# Patient Record
Sex: Female | Born: 1935 | Race: White | Hispanic: No | Smoking: Never smoker
Health system: Southern US, Community
[De-identification: ages and names within clinical notes are randomized; demographics above are authoritative.]

## PROBLEM LIST (undated history)

## (undated) DIAGNOSIS — I10 Essential (primary) hypertension: Secondary | ICD-10-CM

## (undated) DIAGNOSIS — I639 Cerebral infarction, unspecified: Secondary | ICD-10-CM

## (undated) HISTORY — PX: ABDOMINAL HYSTERECTOMY: SHX81

---

## 2004-05-10 ENCOUNTER — Ambulatory Visit: Payer: Self-pay | Admitting: Family Medicine

## 2004-08-29 ENCOUNTER — Ambulatory Visit: Payer: Self-pay | Admitting: Gastroenterology

## 2005-06-18 ENCOUNTER — Ambulatory Visit: Payer: Self-pay | Admitting: Family Medicine

## 2006-06-23 ENCOUNTER — Ambulatory Visit: Payer: Self-pay | Admitting: Family Medicine

## 2007-07-20 ENCOUNTER — Ambulatory Visit: Payer: Self-pay | Admitting: Family Medicine

## 2008-02-24 ENCOUNTER — Ambulatory Visit: Payer: Self-pay | Admitting: Gastroenterology

## 2008-07-25 ENCOUNTER — Ambulatory Visit: Payer: Self-pay | Admitting: Family Medicine

## 2009-08-09 ENCOUNTER — Ambulatory Visit: Payer: Self-pay | Admitting: Family Medicine

## 2010-03-23 ENCOUNTER — Ambulatory Visit: Payer: Self-pay

## 2011-01-23 ENCOUNTER — Ambulatory Visit: Payer: Self-pay | Admitting: Family Medicine

## 2011-05-22 ENCOUNTER — Ambulatory Visit: Payer: Self-pay | Admitting: Family Medicine

## 2011-07-21 ENCOUNTER — Ambulatory Visit: Payer: Self-pay | Admitting: Gastroenterology

## 2013-07-10 ENCOUNTER — Observation Stay: Payer: Self-pay | Admitting: Internal Medicine

## 2013-07-10 LAB — TSH: Thyroid Stimulating Horm: 2.82 u[IU]/mL

## 2013-07-10 LAB — BASIC METABOLIC PANEL
Anion Gap: 10 (ref 7–16)
BUN: 13 mg/dL (ref 7–18)
CALCIUM: 10 mg/dL (ref 8.5–10.1)
CREATININE: 1.05 mg/dL (ref 0.60–1.30)
Chloride: 96 mmol/L — ABNORMAL LOW (ref 98–107)
Co2: 27 mmol/L (ref 21–32)
EGFR (Non-African Amer.): 48 — ABNORMAL LOW
GFR CALC AF AMER: 56 — AB
GLUCOSE: 175 mg/dL — AB (ref 65–99)
Osmolality: 271 (ref 275–301)
POTASSIUM: 3.4 mmol/L — AB (ref 3.5–5.1)
SODIUM: 133 mmol/L — AB (ref 136–145)

## 2013-07-10 LAB — CBC
HCT: 47.2 % — AB (ref 35.0–47.0)
HGB: 16 g/dL (ref 12.0–16.0)
MCH: 31.4 pg (ref 26.0–34.0)
MCHC: 33.8 g/dL (ref 32.0–36.0)
MCV: 93 fL (ref 80–100)
PLATELETS: 220 10*3/uL (ref 150–440)
RBC: 5.09 10*6/uL (ref 3.80–5.20)
RDW: 13.9 % (ref 11.5–14.5)
WBC: 7.3 10*3/uL (ref 3.6–11.0)

## 2013-07-10 LAB — TROPONIN I
TROPONIN-I: 0.11 ng/mL — AB
Troponin-I: 0.11 ng/mL — ABNORMAL HIGH
Troponin-I: 0.11 ng/mL — ABNORMAL HIGH

## 2013-07-10 LAB — MAGNESIUM: MAGNESIUM: 1.5 mg/dL — AB

## 2013-07-11 LAB — CBC WITH DIFFERENTIAL/PLATELET
BASOS PCT: 0.9 %
Basophil #: 0.1 10*3/uL (ref 0.0–0.1)
Eosinophil #: 0.2 10*3/uL (ref 0.0–0.7)
Eosinophil %: 2.3 %
HCT: 43.9 % (ref 35.0–47.0)
HGB: 15 g/dL (ref 12.0–16.0)
Lymphocyte #: 2.4 10*3/uL (ref 1.0–3.6)
Lymphocyte %: 27.8 %
MCH: 31.6 pg (ref 26.0–34.0)
MCHC: 34.1 g/dL (ref 32.0–36.0)
MCV: 93 fL (ref 80–100)
MONOS PCT: 8.9 %
Monocyte #: 0.8 x10 3/mm (ref 0.2–0.9)
NEUTROS PCT: 60.1 %
Neutrophil #: 5.1 10*3/uL (ref 1.4–6.5)
Platelet: 218 10*3/uL (ref 150–440)
RBC: 4.74 10*6/uL (ref 3.80–5.20)
RDW: 13.9 % (ref 11.5–14.5)
WBC: 8.5 10*3/uL (ref 3.6–11.0)

## 2013-07-11 LAB — LIPID PANEL
CHOLESTEROL: 159 mg/dL (ref 0–200)
HDL: 45 mg/dL (ref 40–60)
Ldl Cholesterol, Calc: 87 mg/dL (ref 0–100)
TRIGLYCERIDES: 135 mg/dL (ref 0–200)
VLDL CHOLESTEROL, CALC: 27 mg/dL (ref 5–40)

## 2013-07-11 LAB — BASIC METABOLIC PANEL
Anion Gap: 7 (ref 7–16)
BUN: 11 mg/dL (ref 7–18)
CREATININE: 0.96 mg/dL (ref 0.60–1.30)
Calcium, Total: 9.6 mg/dL (ref 8.5–10.1)
Chloride: 104 mmol/L (ref 98–107)
Co2: 27 mmol/L (ref 21–32)
GFR CALC NON AF AMER: 54 — AB
Glucose: 91 mg/dL (ref 65–99)
OSMOLALITY: 275 (ref 275–301)
POTASSIUM: 4 mmol/L (ref 3.5–5.1)
Sodium: 138 mmol/L (ref 136–145)

## 2013-07-11 LAB — HEMOGLOBIN A1C: Hemoglobin A1C: 6.2 % (ref 4.2–6.3)

## 2014-01-25 ENCOUNTER — Ambulatory Visit: Payer: Self-pay | Admitting: Ophthalmology

## 2014-02-22 ENCOUNTER — Ambulatory Visit: Payer: Self-pay | Admitting: Ophthalmology

## 2014-06-10 NOTE — Discharge Summary (Signed)
PATIENT NAME:  Christina Bradshaw, Christina Bradshaw MR#:  [id number] DATE OF BIRTH:  [birth year]  DATE OF ADMISSION:  07/10/2013 DATE OF DISCHARGE:  07/11/2013  DISCHARGE DIAGNOSES: 1.  Elevated troponin, likely due to supply/demand ischemia with elevated heart rate and uncontrolled blood pressure.  2.  Possible arrhythmia, likely supraventricular tachycardia, now normal sinus rhythm.  3.  Hypokalemia, repleted and resolved.  4.  Uncontrolled hypertension, much better now. 5.  Impaired fasting glucose with hemoglobin A1c of 6.2. 6.  Anxiety, on as needed Xanax.   SECONDARY DIAGNOSES: 1.  Hypothyroidism.  2.  Hypertension.  3.  Constipation.   CONSULTATIONS: Cardiology, Dr. Harold HedgeKenneth Fath.   PROCEDURES AND RADIOLOGY: Chest x-ray on the 24th of May showed COPD. No acute cardiopulmonary disease. Aortic atherosclerosis.   A 2-D echocardiogram on the 24th of May showed LVEF of 55% to 60%.   HISTORY AND SHORT HOSPITAL COURSE: The patient is an 81 year old female with the above-mentioned medical problems who was admitted for fast beating heart/tachycardia. She was also found to have elevated troponin, which was thought to be due to supply/demand ischemia. Arrhythmia was initially thought to be SVT and was improved with rate controlling medication. Please see Dr. Jetta Loutichard Wieting's dictated history and physical for further details. Cardiology consultation was obtained with Dr. Harold HedgeKenneth Fath who recommended 2-D echocardiogram, which was obtained with results dictated above. The patient was feeling much better. By the 25th of May her rate was much better controlled. Potassium was repleted. Blood pressure got significantly better controlled and was discharged home in stable condition.   PERTINENT DISCHARGE PHYSICAL EXAMINATION: VITAL SIGNS: On the date of discharge, her temperature was 97.7, heart rate 77 per minute, respirations 16 per minute, blood pressure 154/78 mmHg and she was saturating 97% on room air.   CARDIOVASCULAR: S1, S2 normal. No murmurs, rubs or gallops.  LUNGS: Clear to auscultation bilaterally. No wheezing, rales, rhonchi, or crepitation.  ABDOMEN: Soft, benign.  NEUROLOGIC: Nonfocal examination. All other physical examination remained at baseline.   DISCHARGE MEDICATIONS:  1.  Amlodipine 5 mg p.o. daily.  2.  Lisinopril 40 mg p.o. daily. 3.  Levothyroxine 75 mcg p.o. daily.  4.  Vitamin D3 400 international units once at bedtime.  5.  Colace 100 mg 2 tablets p.o. at bedtime.  6.  Tylenol 1000 mg p.o. every 6 hours needed.  7.  Tylenol PM 1 tablet at bedtime as needed.  8.  Aspirin 81 mg p.o. daily.  9.  Isosorbide mononitrate 30 mg p.o. daily.  10.  Metoprolol 25 mg p.o. b.i.d. 11.  Hydralazine 10 mg p.o. 4 times a day.  12.  Alprazolam 0.25 mg p.o. at bedtime as needed.   DISCHARGE DIET: Low sodium, low fat, low cholesterol.   DISCHARGE ACTIVITY: As tolerated.   DISCHARGE INSTRUCTIONS AND FOLLOW-UP: The patient was instructed to follow up with her primary care physician, Dr. Dorothey Basemanavid Bronstein, in 1 to 2 weeks. She will [facility] in 2 to 4 weeks.   TOTAL TIME DISCHARGING THIS PATIENT: 45 minutes.  ____________________________ Ellamae SiaVipul S. Sherryll BurgerShah, MD vss:sb D: 07/15/2013 11:25:24 ET T: 07/15/2013 11:44:38 ET JOB#: [id number]  cc: Theodis Kinsel S. Sherryll BurgerShah, MD, <Dictator> Teena Iraniavid M. Terance HartBronstein, MD Darlin PriestlyKenneth A. Lady GaryFath, MD Patricia PesaVIPUL S Braheem Tomasik MD ELECTRONICALLY SIGNED 07/15/2013 12:11

## 2014-06-10 NOTE — Consult Note (Signed)
   Present Illness 81 yo female with history of hypertension who was admitted after several days of rapid irregular heart rate and hypertensive crisis. She has been tereated with amlodipine, hctz and lisinopril for some time. She recently had the amolodipiine stopped but developed worseing hypertensition and was placed back on the drug. She presented with palpitations and had relative tachycardia on her ekg. No ischemia. She had a trivial tropoinin elevation. No acute ischemic changes on ekg. She is currently still relatively hypertensive but denies chest pain.   Physical Exam:  GEN well developed, well nourished, no acute distress   HEENT red conjunctivae, hearing intact to voice   NECK supple   RESP clear BS   CARD Regular rate and rhythm  Normal, S1, S2  No murmur   ABD denies tenderness  normal BS  no Adominal Mass   LYMPH negative neck, negative axillae   EXTR negative cyanosis/clubbing, negative edema   SKIN normal to palpation, No ulcers   NEURO cranial nerves intact, motor/sensory function intact   PSYCH A+O to time, place, person   Review of Systems:  Subjective/Chief Complaint palpitations adn chest tightness on admission but asymptomatic at present.   General: No Complaints   Skin: No Complaints   ENT: No Complaints   Eyes: No Complaints   Neck: No Complaints   Respiratory: No Complaints   Cardiovascular: Tightness  Palpitations   Gastrointestinal: No Complaints   Genitourinary: No Complaints   Vascular: No Complaints   Musculoskeletal: No Complaints   Neurologic: No Complaints   Hematologic: No Complaints   Endocrine: No Complaints   Psychiatric: No Complaints   Review of Systems: All other systems were reviewed and found to be negative   Medications/Allergies Reviewed Medications/Allergies reviewed   Family & Social History:  Family and Social History:  Family History Non-Contributory   Social History negative tobacco, negative ETOH    Place of Living Home   EKG:  EKG NSR   Abnormal NSSTTW changes    No Known Allergies:    Impression 81 yo female with history of hypertension who was admitted with palpitations and hypertension. She has mild tropoin elevation that does not appear to be ischemic. Likley scondary to tachcyardia. Agree wtih  adding metoprolol and hydralazine to her regimen. Follow bp resonse and herat rate.   Plan 1. Continue beta blockers, ace i, hydralazine, hctz and amlodipiine. 2. Follow blood pressure. 3 Ambulate i am and if stable consider discharge.  4. Not a candidate for invasive or noninvasive cardiac workup at present.   Electronic Signatures: Dalia HeadingFath, Journee Bobrowski A (MD)  (Signed 25-May-15 10:47)  Authored: General Aspect/Present Illness, History and Physical Exam, Review of System, Family & Social History, EKG , Allergies, Impression/Plan   Last Updated: 25-May-15 10:47 by Dalia HeadingFath, Daleysa Kristiansen A (MD)

## 2014-06-10 NOTE — H&P (Signed)
PATIENT NAME:  Christina Bradshaw, Christina Bradshaw MR#:  [id number] DATE OF BIRTH:  09-25-1924  DATE OF ADMISSION:  07/10/2013  PRIMARY CARE PHYSICIAN: Teena Irani. Terance Hart, MD    CHIEF COMPLAINT: Heart beating fast.   HISTORY OF PRESENT ILLNESS: This is an 81 year old female who states that she had her heart beating fast, going on all night last night, skipping beats. Her blood pressure was high. She did not feel good in the chest, a full type of feeling like gas. Belching seemed to help a little bit. It lasted all night and it resolved before she came to the Emergency Room. In the ER, she was in normal sinus rhythm with heart rate in the 80s. ER physician ordered a troponin, which was borderline at 0.11. Hospitalist services were contacted for admission.   PAST MEDICAL HISTORY: Hypothyroidism, hypertension, constipation.   PAST SURGICAL HISTORY: Hysterectomy.   ALLERGIES: No known drug allergies   MEDICATIONS: Include Norvasc 5 mg daily, lisinopril 40 mg daily, hydrochlorothiazide 25 mg daily, levothyroxine 75 mcg daily. She does take an antacid, stool softener, vitamin D, and Tylenol.   SOCIAL HISTORY: No smoking. No alcohol. No drug use. 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 Currently lives alone.   FAMILY HISTORY: Father died of COPD. Mother died of breast cancer.   REVIEW OF SYSTEMS:    CONSTITUTIONAL: No fever, chills, or sweats. Positive for some weight loss. Positive for fatigue.  EYES: She does need glasses.  EARS, NOSE, MOUTH, AND THROAT: Decreased hearing. No sore throat. No difficulty swallowing.  CARDIOVASCULAR: Positive for palpitations. No chest pain but a fullness feeling in the chest, gas-type pain.  RESPIRATORY: No shortness of breath. No cough. No sputum. No hemoptysis.  GASTROINTESTINAL: Occasional abdominal discomfort, gas. Positive for constipation. No bright blood per rectum. No melena.  GENITOURINARY: No burning on urination. No hematuria.  MUSCULOSKELETAL: Positive for stiff joints.   INTEGUMENTARY: No rashes or eruptions.  NEUROLOGIC: No fainting or blackouts.  PSYCHIATRIC: Positive for anxiety.  ENDOCRINE: Positive for hypothyroidism.  HEMATOLOGIC AND LYMPHATIC: No anemia, no easy bruising or bleeding.   PHYSICAL EXAMINATION:  EYES: Conjunctivae and lids normal. Pupils equal, round, and reactive to light. Extraocular muscles intact. No nystagmus.  EARS, NOSE, MOUTH, AND THROAT: Tympanic membranes: No erythema. Nasal mucosa: No erythema. Throat: No erythema. No exudate seen. Lips and gums: No lesions.  NECK: No JVD. No bruits. No lymphadenopathy. No thyromegaly. No thyroid nodules palpated.  LUNGS: Clear to auscultation. No use of accessory muscles to breathe. No rhonchi, rales, or wheeze heard.  CARDIOVASCULAR: S1, S2 normal. Positive II/VI systolic ejection murmur. Carotid upstroke 2+ bilaterally. No bruits.  EXTREMITIES: Dorsalis pedis pulses 2+ bilaterally. No edema of the lower extremity.  ABDOMEN: Soft, nontender. No organomegaly/splenomegaly. Normoactive bowel sounds. No masses felt.  LYMPHATIC: No lymph nodes in the neck.  MUSCULOSKELETAL: No clubbing, edema, or cyanosis.  SKIN: No rashes or ulcers seen.  NEUROLOGIC: Cranial nerves II through XII grossly intact. Deep tendon reflexes 2+ bilateral lower extremities.  PSYCHIATRIC: The patient is oriented to person, place, and time.   LABORATORY AND RADIOLOGICAL DATA: TSH 2.82. Glucose 175, BUN 13, creatinine 1.05, sodium 133, potassium 3.4, chloride 96, CO2 of 27, calcium 10. White blood cell count  7.3, hemoglobin and hematocrit 16.0 and 47.2, platelet count of 220. Troponin 0.11.   Chest x-ray: Pulmonary hyperexpansion consistent with underlying COPD. No acute cardiopulmonary disease noted.   EKG: Normal sinus rhythm, left anterior fascicular block, LVH.   ASSESSMENT AND PLAN:  1.  Elevated troponin, likely demand ischemia from rapid heart rate overnight. Will monitor on telemetry. Get serial cardiac enzymes.  Continue aspirin and metoprolol. Obtain an echocardiogram.  2.  Likely arrhythmia, like supraventricular tachycardia, less likely atrial fibrillation. Monitor on telemetry overnight. Have cardiology see the patient. Start on metoprolol 25 mg stat and b.i.d.  3.  Hypokalemia. Will replace potassium orally. Check a magnesium and replace that if low also. Stop hydrochlorothiazide.  4.  Hypertension. Will use metoprolol instead of hydrochlorothiazide. Continue lisinopril and Norvasc.  5.  Gastroesophageal reflux disease. The patient takes an antacid. Will continue her antacid.  6.  Constipation. Will continue a stool softener.  7.  Impaired fasting glucose. Will put on sliding scale and check a hemoglobin A1c in the a.m.  8.  Hypothyroidism on levothyroxine.   TIME SPENT ON ADMISSION: 55 minutes.   CODE STATUS: The patient is a DNR.    ____________________________ Herschell Dimesichard J. Renae GlossWieting, MD rjw:jcm D: 07/10/2013 11:30:51 ET T: 07/10/2013 13:31:57 ET JOB#: [id number]  cc: Herschell Dimesichard J. Renae GlossWieting, MD, <Dictator> Teena Iraniavid M. Terance HartBronstein, MD Salley ScarletICHARD J Shizuye Rupert MD ELECTRONICALLY SIGNED 07/10/2013 16:41

## 2014-06-10 NOTE — Discharge Summary (Signed)
Dates of Admission and Diagnosis:  Date of Admission 10-Jul-2013   Date of Discharge 11-Jul-2013   Admitting Diagnosis Elevated troponin   Final Diagnosis Elevated troponin, likely demand ischemia from rapid heart rate and uncontrolled blood pressure.   Discharge Diagnosis 1 Possible arrhythmia, like supraventricular tachycardia: present on admission but has been in NSR while inpt, continue  metoprolol for rate and bp control   2 Hypokalemia: repleted and resolved   3 Uncontrolled Hypertension: much better controlled now.   4 Impaired fasting glucose: hemoglobin A1c of 6.2   5 Anxiety: prn xanax (10 tabs given - no refills)    Chief Complaint/History of Present Illness Heart Beating fast   Allergies:  No Known Allergies:   Hospital Course:  Condition on Discharge Good   Code Status:  Code Status No Code/Do Not Resuscitate   PHYSICAL EXAM ON DISCHARGE:  Physical Exam:  GEN no acute distress   HEENT pink conjunctivae   NECK thyroid not tender   RESP normal resp effort  clear BS   CARD regular rate  no murmur   ABD denies tenderness  soft  normal BS   EXTR negative edema   SKIN No ulcers   NEURO follows commands   PSYCH alert, A+O to time, place, person   DISCHARGE INSTRUCTIONS HOME MEDS:  Medication Reconciliation: Patient's Home Medications at Discharge:     Medication Instructions  amlodipine 5 mg oral tablet  1 tab(s) orally once a day   lisinopril 40 mg oral tablet  1 tab(s) orally once a day   levothyroxine 75 mcg (0.075 mg) oral tablet  1 tab(s) orally once a day   vitamin d3 400 intl units oral capsule  1 cap(s) orally once a day (at bedtime)   docusate sodium sodium 100 mg oral capsule  2 caps ( ) orally once a day (at bedtime)   tylenol 500 mg oral tablet  2 tabs ( ) orally every 6 hours as needed   tylenol pm 500 mg-25 mg oral tablet  1 tab(s) orally once a day (at bedtime) as needed for Inability to Sleep   aspirin enteric coated  81 mg oral delayed release tablet  1 tab(s) orally once a day   isosorbide mononitrate 30 mg oral tablet, extended release  1 tab(s) orally once a day   metoprolol tartrate 25 mg oral tablet  1 tab(s) orally 2 times a day   hydralazine 10 mg oral tablet  1 tab(s) orally 4 times a day   alprazolam 0.25 mg oral tablet  1 tab(s) orally once a day (at bedtime), As Needed - for Anxiety, Nervousness    STOP TAKING THE FOLLOWING MEDICATION(S):    hydrochlorothiazide 25 mg oral tablet: 1 tab(s) orally once a day  Physician's Instructions:  Diet Low Sodium  Low Fat, Low Cholesterol   Activity Limitations As tolerated   Return to Work Not Applicable   Time frame for Follow Up Appointment 1-2 weeks  DR Terance Hart   Time frame for Follow Up Appointment 2-4 weeks  [facility] CARDIO     Harold Hedge A(Consultant): [facility], [address] [place], [place] [address], [place] [phone]   Dorothey Baseman M(Family Physician): [facility], [address], [place], [place] [address], [place] [phone]  TIME SPENT:  Total Time: Greater than 30 minutes   Electronic Signatures: Patricia Pesa (MD)  (Signed 27-May-15 23:08)  Authored: ADMISSION DATE AND DIAGNOSIS, CHIEF COMPLAINT/HPI, Allergies, HOSPITAL COURSE, PHYSICAL EXAM ON DISCHARGE, DISCHARGE INSTRUCTIONS HOME MEDS, PATIENT INSTRUCTIONS,  Follow Up Physician, TIME SPENT   Last Updated: 27-May-15 23:08 by Patricia PesaShah, Shayanna Thatch S (MD)

## 2014-10-25 ENCOUNTER — Emergency Department: Payer: Medicare Other

## 2014-10-25 ENCOUNTER — Encounter: Payer: Self-pay | Admitting: Emergency Medicine

## 2014-10-25 ENCOUNTER — Other Ambulatory Visit: Payer: Self-pay

## 2014-10-25 ENCOUNTER — Emergency Department
Admission: EM | Admit: 2014-10-25 | Discharge: 2014-10-25 | Disposition: A | Discharge status: Home / Self Care | Payer: Medicare Other | Attending: Emergency Medicine | Admitting: Emergency Medicine

## 2014-10-25 DIAGNOSIS — Z79899 Other long term (current) drug therapy: Secondary | ICD-10-CM | POA: Insufficient documentation

## 2014-10-25 DIAGNOSIS — I10 Essential (primary) hypertension: Secondary | ICD-10-CM | POA: Diagnosis not present

## 2014-10-25 DIAGNOSIS — R112 Nausea with vomiting, unspecified: Secondary | ICD-10-CM | POA: Insufficient documentation

## 2014-10-25 DIAGNOSIS — R42 Dizziness and giddiness: Secondary | ICD-10-CM | POA: Diagnosis not present

## 2014-10-25 HISTORY — DX: Essential (primary) hypertension: I10

## 2014-10-25 LAB — CBC
HEMATOCRIT: 46.9 % (ref 35.0–47.0)
HEMOGLOBIN: 15.6 g/dL (ref 12.0–16.0)
MCH: 30.4 pg (ref 26.0–34.0)
MCHC: 33.3 g/dL (ref 32.0–36.0)
MCV: 91.2 fL (ref 80.0–100.0)
Platelets: 202 10*3/uL (ref 150–440)
RBC: 5.15 MIL/uL (ref 3.80–5.20)
RDW: 15.1 % — ABNORMAL HIGH (ref 11.5–14.5)
WBC: 8.6 10*3/uL (ref 3.6–11.0)

## 2014-10-25 LAB — BASIC METABOLIC PANEL
ANION GAP: 11 (ref 5–15)
BUN: 15 mg/dL (ref 6–20)
CO2: 24 mmol/L (ref 22–32)
Calcium: 9.7 mg/dL (ref 8.9–10.3)
Chloride: 102 mmol/L (ref 101–111)
Creatinine, Ser: 1.05 mg/dL — ABNORMAL HIGH (ref 0.44–1.00)
GFR, EST AFRICAN AMERICAN: 54 mL/min — AB (ref >60)
GFR, EST NON AFRICAN AMERICAN: 46 mL/min — AB (ref >60)
GLUCOSE: 185 mg/dL — AB (ref 65–99)
POTASSIUM: 3.7 mmol/L (ref 3.5–5.1)
Sodium: 137 mmol/L (ref 135–145)

## 2014-10-25 LAB — URINALYSIS COMPLETE WITH MICROSCOPIC (ARMC ONLY)
BACTERIA UA: NONE SEEN
Bilirubin Urine: NEGATIVE
GLUCOSE, UA: 150 mg/dL — AB
HGB URINE DIPSTICK: NEGATIVE
Ketones, ur: NEGATIVE mg/dL
Leukocytes, UA: NEGATIVE
NITRITE: NEGATIVE
PH: 8 (ref 5.0–8.0)
PROTEIN: 30 mg/dL — AB
SPECIFIC GRAVITY, URINE: 1.009 (ref 1.005–1.030)

## 2014-10-25 LAB — TROPONIN I

## 2014-10-25 MED ORDER — ONDANSETRON 4 MG PO TBDP
4.0000 mg | ORAL_TABLET | Freq: Once | ORAL | Status: AC
  Administered 2014-10-25: 4 mg via ORAL
  Filled 2014-10-25: qty 1

## 2014-10-25 MED ORDER — MECLIZINE HCL 25 MG PO TABS: 25.0000 mg | ORAL_TABLET | Freq: Three times a day (TID) | ORAL | Status: DC | PRN

## 2014-10-25 MED ORDER — ONDANSETRON HCL 4 MG PO TABS: 4.0000 mg | ORAL_TABLET | Freq: Three times a day (TID) | ORAL | Status: DC | PRN

## 2014-10-25 MED ORDER — MECLIZINE HCL 25 MG PO TABS
25.0000 mg | ORAL_TABLET | Freq: Once | ORAL | Status: AC
  Administered 2014-10-25: 25 mg via ORAL
  Filled 2014-10-25: qty 1

## 2014-10-25 NOTE — Discharge Instructions (Signed)
You were evaluated for nausea and dizziness, and your exam and evaluation are reassuring. I suspect your symptoms are from vertigo and you were treated with Zofran and meclizine for symptomatic control. Prescriptions were called into [facility] on [address]. Return to the emergency department for any worsening condition including any altered mental status, weakness, numbness, confusion, chest pain or palpitations, trouble breathing, passing out, vomiting and abdominal pain or any other symptoms concerning to you.   Benign Positional Vertigo Vertigo means you feel like you or your surroundings are moving when they are not. Benign positional vertigo is the most common form of vertigo. Benign means that the cause of your condition is not serious. Benign positional vertigo is more common in older adults. CAUSES  Benign positional vertigo is the result of an upset in the labyrinth system. This is an area in the middle ear that helps control your balance. This may be caused by a viral infection, head injury, or repetitive motion. However, often no specific cause is found. SYMPTOMS  Symptoms of benign positional vertigo occur when you move your head or eyes in different directions. Some of the symptoms may include:  Loss of balance and falls.  Vomiting.  Blurred vision.  Dizziness.  Nausea.  Involuntary eye movements (nystagmus). DIAGNOSIS  Benign positional vertigo is usually diagnosed by physical exam. If the specific cause of your benign positional vertigo is unknown, your caregiver may perform imaging tests, such as magnetic resonance imaging (MRI) or computed tomography (CT). TREATMENT  Your caregiver may recommend movements or procedures to correct the benign positional vertigo. Medicines such as meclizine, benzodiazepines, and medicines for nausea may be used to treat your symptoms. In rare cases, if your symptoms are caused by certain conditions that affect the inner ear, you may need  surgery. HOME CARE INSTRUCTIONS   Follow your caregiver's instructions.  Move slowly. Do not make sudden body or head movements.  Avoid driving.  Avoid operating heavy machinery.  Avoid performing any tasks that would be dangerous to you or others during a vertigo episode.  Drink enough fluids to keep your urine clear or pale yellow. SEEK IMMEDIATE MEDICAL CARE IF:   You develop problems with walking, weakness, numbness, or using your arms, hands, or legs.  You have difficulty speaking.  You develop severe headaches.  Your nausea or vomiting continues or gets worse.  You develop visual changes.  Your family or friends notice any behavioral changes.  Your condition gets worse.  You have a fever.  You develop a stiff neck or sensitivity to light. MAKE SURE YOU:   Understand these instructions.  Will watch your condition.  Will get help right away if you are not doing well or get worse. Document Released: 11/11/2005 Document Revised: 04/28/2011 Document Reviewed: 10/24/2010 [facility] Patient Information 2015 [place], [place]. This information is not intended to replace advice given to you by your health care provider. Make sure you discuss any questions you have with your health care provider.

## 2014-10-25 NOTE — ED Notes (Signed)
Pt to ED with c/o sudden onset of dizziness with n,v around 1400 today, states when she woke up this am she felt fine

## 2014-10-25 NOTE — ED Notes (Signed)
Patient with no complaints at this time. Respirations even and unlabored. Skin warm/dry. Discharge instructions reviewed with patient at this time. Patient given opportunity to voice concerns/ask questions. IV removed per policy and band-aid applied to site. Patient discharged at this time and left Emergency Department, via wheelchair.   

## 2014-10-25 NOTE — ED Notes (Signed)
MD Lorde at bedside, completing medical evaluation.

## 2014-10-25 NOTE — ED Provider Notes (Signed)
[facility] Emergency Department Provider Note   ____________________________________________  Time seen: 7:45 PM I have reviewed the triage vital signs and the triage nursing note.  HISTORY  Chief Complaint Dizziness and Nausea   Historian Patient  HPI Christina Bradshaw is a 81 y.o. female who has a history simply of hypertension, who lives alone, who was feeling well this morning and then this afternoon around 2 PM she had acute onset of nausea vomiting and dizziness.It seems worse when she changes position gets up and walks, or moves her head. She's never had this before. She's had some experience of spinning more than lightheadedness. No head injury. No chest pain or palpitations. No trouble breathing. No diarrhea.    Past Medical History  Diagnosis Date  . Hypertension     There are no active problems to display for this patient.   Past Surgical History  Procedure Laterality Date  . Abdominal hysterectomy      Current Outpatient Rx  Name  Route  Sig  Dispense  Refill  . amLODipine (NORVASC) 5 MG tablet   Oral   Take 5 mg by mouth daily.         Marland Kitchen docusate sodium (COLACE) 100 MG capsule   Oral   Take 100 mg by mouth at bedtime.         Marland Kitchen levothyroxine (SYNTHROID, LEVOTHROID) 75 MCG tablet   Oral   Take 75 mcg by mouth daily before breakfast.         . lisinopril (PRINIVIL,ZESTRIL) 40 MG tablet   Oral   Take 40 mg by mouth daily.         . metoprolol tartrate (LOPRESSOR) 25 MG tablet   Oral   Take 25 mg by mouth 2 (two) times daily.         . Multiple Vitamin (MULTIVITAMIN WITH MINERALS) TABS tablet   Oral   Take 1 tablet by mouth daily.         Marland Kitchen omeprazole (PRILOSEC) 20 MG capsule   Oral   Take 20 mg by mouth daily.         . Vitamin D, Cholecalciferol, 400 UNITS CAPS   Oral   Take 400 Units by mouth daily.           Allergies Review of patient's allergies indicates no known allergies.  No family  history on file.  Social History Social History  Substance Use Topics  . Smoking status: Never Smoker   . Smokeless tobacco: None  . Alcohol Use: No    Review of Systems  Constitutional: Negative for fever. Eyes: Negative for visual changes. ENT: Negative for sore throat. Cardiovascular: Negative for chest pain. Respiratory: Negative for shortness of breath. Gastrointestinal: Negative for abdominal pain or diarrhea. Genitourinary: Negative for dysuria. Musculoskeletal: Negative for back pain. Skin: Negative for rash. Neurological: Negative for headache. 10 point Review of Systems otherwise negative ____________________________________________   PHYSICAL EXAM:  VITAL SIGNS: ED Triage Vitals  Enc Vitals Group     BP 10/25/14 1859 191/82 mmHg     Pulse Rate 10/25/14 1859 84     Resp 10/25/14 1859 18     Temp 10/25/14 1859 97.8 F (36.6 C)     Temp Source 10/25/14 1859 Oral     SpO2 10/25/14 1859 99 %     Weight 10/25/14 1859 149 lb (67.586 kg)     Height 10/25/14 1859 5\' 7"  (1.702 m)     Head Cir --  Peak Flow --      Pain Score --      Pain Loc --      Pain Edu? --      Excl. in GC? --      Constitutional: Alert and oriented. Well appearing and in no distress. Eyes: Conjunctivae are normal. PERRL. Normal extraocular movements. ENT   Head: Normocephalic and atraumatic.   Nose: No congestion/rhinnorhea.   Mouth/Throat: Mucous membranes are moist.   Neck: No stridor. Cardiovascular/Chest: Normal rate, regular rhythm.  No murmurs, rubs, or gallops. Respiratory: Normal respiratory effort without tachypnea nor retractions. Breath sounds are clear and equal bilaterally. No wheezes/rales/rhonchi. Gastrointestinal: Soft. No distention, no guarding, no rebound. Nontender   Genitourinary/rectal:Deferred Musculoskeletal: Nontender with normal range of motion in all extremities. No joint effusions.  No lower extremity tenderness.  No edema. Neurologic:   Normal speech and language. No gross or focal neurologic deficits are appreciated. Skin:  Skin is warm, dry and intact. No rash noted. Psychiatric: Mood and affect are normal. Speech and behavior are normal. Patient exhibits appropriate insight and judgment.  ____________________________________________   EKG I, Governor Rooks, MD, the attending physician have personally viewed and interpreted all ECGs.  79 bpm. Normal sinus rhythm. Narrow QRS. Left axis deviation. Normal ST and T-wave. ____________________________________________  LABS (pertinent positives/negatives)  Basic metabolic panel without significant abnormalities CBC within normal limits Urinalysis negative for blood or urinary tract infection Troponin less than 0.03  ____________________________________________  RADIOLOGY All Xrays were viewed by me. Imaging interpreted by Radiologist.  CT head noncontrast:  IMPRESSION: No acute abnormality.  Atrophy and chronic microvascular ischemic change. __________________________________________  PROCEDURES  Procedure(s) performed: None  Critical Care performed: None  ____________________________________________   ED COURSE / ASSESSMENT AND PLAN  CONSULTATIONS: None  Pertinent labs & imaging results that were available during my care of the patient were reviewed by me and considered in my medical decision making (see chart for details).   Symptoms clinically sound most consistent with benign positional vertigo, and patient's workup and evaluation are reassuring. Patient's symptoms are gone after meclizine and Zofran. I feel stroke TIA is very unlikely given her neurologic exam is intact in terms of coordination. Head CT was obtained and was negative.  Patient / Family / Caregiver informed of clinical course, medical decision-making process, and agree with plan.   I discussed return precautions, follow-up instructions, and discharged instructions with patient  and/or family.  ___________________________________________   FINAL CLINICAL IMPRESSION(S) / ED DIAGNOSES   Final diagnoses:  Vertigo       Governor Rooks, MD 10/25/14 2257

## 2014-10-26 LAB — GLUCOSE, CAPILLARY: Glucose-Capillary: 169 mg/dL — ABNORMAL HIGH (ref 65–99)

## 2016-02-26 ENCOUNTER — Emergency Department: Payer: Medicare Other

## 2016-02-26 ENCOUNTER — Inpatient Hospital Stay
Admission: EM | Admit: 2016-02-26 | Discharge: 2016-02-29 | DRG: 065 | Disposition: A | Discharge status: Rehabilitation Facility | Payer: Medicare Other | Attending: Internal Medicine | Admitting: Internal Medicine

## 2016-02-26 DIAGNOSIS — R7989 Other specified abnormal findings of blood chemistry: Secondary | ICD-10-CM | POA: Diagnosis present

## 2016-02-26 DIAGNOSIS — R471 Dysarthria and anarthria: Secondary | ICD-10-CM | POA: Diagnosis present

## 2016-02-26 DIAGNOSIS — K219 Gastro-esophageal reflux disease without esophagitis: Secondary | ICD-10-CM | POA: Diagnosis present

## 2016-02-26 DIAGNOSIS — R7303 Prediabetes: Secondary | ICD-10-CM | POA: Diagnosis present

## 2016-02-26 DIAGNOSIS — G459 Transient cerebral ischemic attack, unspecified: Secondary | ICD-10-CM | POA: Diagnosis present

## 2016-02-26 DIAGNOSIS — R29707 NIHSS score 7: Secondary | ICD-10-CM | POA: Diagnosis present

## 2016-02-26 DIAGNOSIS — I739 Peripheral vascular disease, unspecified: Secondary | ICD-10-CM | POA: Diagnosis present

## 2016-02-26 DIAGNOSIS — I639 Cerebral infarction, unspecified: Secondary | ICD-10-CM

## 2016-02-26 DIAGNOSIS — Z79899 Other long term (current) drug therapy: Secondary | ICD-10-CM

## 2016-02-26 DIAGNOSIS — E785 Hyperlipidemia, unspecified: Secondary | ICD-10-CM | POA: Diagnosis present

## 2016-02-26 DIAGNOSIS — E039 Hypothyroidism, unspecified: Secondary | ICD-10-CM | POA: Diagnosis present

## 2016-02-26 DIAGNOSIS — R29810 Facial weakness: Secondary | ICD-10-CM | POA: Diagnosis present

## 2016-02-26 DIAGNOSIS — R4781 Slurred speech: Secondary | ICD-10-CM | POA: Diagnosis present

## 2016-02-26 DIAGNOSIS — I248 Other forms of acute ischemic heart disease: Secondary | ICD-10-CM | POA: Diagnosis present

## 2016-02-26 DIAGNOSIS — I69351 Hemiplegia and hemiparesis following cerebral infarction affecting right dominant side: Secondary | ICD-10-CM

## 2016-02-26 DIAGNOSIS — G8111 Spastic hemiplegia affecting right dominant side: Secondary | ICD-10-CM | POA: Diagnosis present

## 2016-02-26 DIAGNOSIS — G8191 Hemiplegia, unspecified affecting right dominant side: Secondary | ICD-10-CM | POA: Diagnosis present

## 2016-02-26 DIAGNOSIS — R1310 Dysphagia, unspecified: Secondary | ICD-10-CM | POA: Diagnosis present

## 2016-02-26 DIAGNOSIS — I10 Essential (primary) hypertension: Secondary | ICD-10-CM | POA: Diagnosis present

## 2016-02-26 DIAGNOSIS — M810 Age-related osteoporosis without current pathological fracture: Secondary | ICD-10-CM | POA: Diagnosis present

## 2016-02-26 DIAGNOSIS — N3281 Overactive bladder: Secondary | ICD-10-CM | POA: Diagnosis present

## 2016-02-26 DIAGNOSIS — K5909 Other constipation: Secondary | ICD-10-CM | POA: Diagnosis present

## 2016-02-26 DIAGNOSIS — R262 Difficulty in walking, not elsewhere classified: Secondary | ICD-10-CM

## 2016-02-26 DIAGNOSIS — Z66 Do not resuscitate: Secondary | ICD-10-CM | POA: Diagnosis present

## 2016-02-26 HISTORY — DX: Cerebral infarction, unspecified: I63.9

## 2016-02-26 LAB — URINALYSIS, COMPLETE (UACMP) WITH MICROSCOPIC
BILIRUBIN URINE: NEGATIVE
Bacteria, UA: NONE SEEN
Glucose, UA: NEGATIVE mg/dL
HGB URINE DIPSTICK: NEGATIVE
Ketones, ur: NEGATIVE mg/dL
LEUKOCYTES UA: NEGATIVE
NITRITE: NEGATIVE
Protein, ur: 100 mg/dL — AB
SPECIFIC GRAVITY, URINE: 1.009 (ref 1.005–1.030)
Squamous Epithelial / LPF: NONE SEEN
pH: 7 (ref 5.0–8.0)

## 2016-02-26 LAB — CBC
HCT: 47 % (ref 35.0–47.0)
HEMOGLOBIN: 15.8 g/dL (ref 12.0–16.0)
MCH: 31.2 pg (ref 26.0–34.0)
MCHC: 33.7 g/dL (ref 32.0–36.0)
MCV: 92.6 fL (ref 80.0–100.0)
Platelets: 226 10*3/uL (ref 150–440)
RBC: 5.07 MIL/uL (ref 3.80–5.20)
RDW: 14.2 % (ref 11.5–14.5)
WBC: 9.4 10*3/uL (ref 3.6–11.0)

## 2016-02-26 LAB — BASIC METABOLIC PANEL
Anion gap: 9 (ref 5–15)
BUN: 19 mg/dL (ref 6–20)
CO2: 25 mmol/L (ref 22–32)
CREATININE: 0.95 mg/dL (ref 0.44–1.00)
Calcium: 9.8 mg/dL (ref 8.9–10.3)
Chloride: 103 mmol/L (ref 101–111)
GFR calc Af Amer: 60 mL/min — ABNORMAL LOW (ref >60)
GFR, EST NON AFRICAN AMERICAN: 52 mL/min — AB (ref >60)
GLUCOSE: 134 mg/dL — AB (ref 65–99)
Potassium: 4.4 mmol/L (ref 3.5–5.1)
SODIUM: 137 mmol/L (ref 135–145)

## 2016-02-26 LAB — TROPONIN I: Troponin I: <0.03 ng/mL (ref <0.03)

## 2016-02-26 MED ORDER — ASPIRIN 81 MG PO CHEW
324.0000 mg | CHEWABLE_TABLET | Freq: Once | ORAL | Status: AC
  Administered 2016-02-26: 324 mg via ORAL
  Filled 2016-02-26: qty 4

## 2016-02-26 NOTE — ED Provider Notes (Signed)
[facility] Emergency Department Provider Note  ____________________________________________   First MD Initiated Contact with Patient 02/26/16 2204     (approximate)  I have reviewed the triage vital signs and the nursing notes.   HISTORY  Chief Complaint Weakness   HPI Christina Bradshaw is a 83 y.o. female with a history of hypertension and was presented to the emergency department today with weakness as well as dizziness. She says that she is having difficulty writing and feels dizzy even at rest. She has had a history of vertigo at but took meclizine earlier today and has not had any relief. She says that this type of vertigo that she is experiencing now feels different than her previous episodes. Her family is at the bedside and said that the last time that she was no normal with 7 PM last night. They're noting that she seems to be having slurred speech as well as right facial droop. The patient took a baby aspirin earlier today. However, she has no history of stroke. She lives alone at home.   Past Medical History:  Diagnosis Date  . Hypertension     There are no active problems to display for this patient.   Past Surgical History:  Procedure Laterality Date  . ABDOMINAL HYSTERECTOMY      Prior to Admission medications   Medication Sig Start Date End Date Taking? Authorizing Provider  amLODipine (NORVASC) 5 MG tablet Take 5 mg by mouth daily.   Yes Historical Provider, MD  docusate sodium (COLACE) 100 MG capsule Take 100 mg by mouth at bedtime.   Yes Historical Provider, MD  levothyroxine (SYNTHROID, LEVOTHROID) 75 MCG tablet Take 75 mcg by mouth daily before breakfast.   Yes Historical Provider, MD  lisinopril (PRINIVIL,ZESTRIL) 40 MG tablet Take 40 mg by mouth daily.   Yes Historical Provider, MD  metoprolol tartrate (LOPRESSOR) 25 MG tablet Take 25 mg by mouth 2 (two) times daily.   Yes Historical Provider, MD  Multiple Vitamin (MULTIVITAMIN  WITH MINERALS) TABS tablet Take 1 tablet by mouth daily.   Yes Historical Provider, MD  omeprazole (PRILOSEC) 20 MG capsule Take 20 mg by mouth daily.   Yes Historical Provider, MD  Vitamin D, Cholecalciferol, 400 UNITS CAPS Take 400 Units by mouth daily.   Yes Historical Provider, MD  Influenza Virus Vaccine Split (FLUZONE IM) Inject 1 Dose into the muscle once.    Historical Provider, MD    Allergies Patient has no known allergies.  No family history on file.  Social History Social History  Substance Use Topics  . Smoking status: Never Smoker  . Smokeless tobacco: Not on file  . Alcohol use No    Review of Systems Constitutional: No fever/chills Eyes: No visual changes. ENT: No sore throat. Cardiovascular: Denies chest pain. Respiratory: Denies shortness of breath. Gastrointestinal: No abdominal pain.  No nausea, no vomiting.  No diarrhea.  No constipation. Genitourinary: Negative for dysuria. Musculoskeletal: Negative for back pain. Skin: Negative for rash. Neurological: Negative for headaches, focal weakness or numbness.  10-point ROS otherwise negative.  ____________________________________________   PHYSICAL EXAM:  VITAL SIGNS: ED Triage Vitals [02/26/16 1609]  Enc Vitals Group     BP (!) 187/91     Pulse Rate (!) 103     Resp 18     Temp 97.6 F (36.4 C)     Temp Source Oral     SpO2 100 %     Weight 150 lb (68 kg)  Height 5\' 7"  (1.702 m)     Head Circumference      Peak Flow      Pain Score      Pain Loc      Pain Edu?      Excl. in GC?     Constitutional: Alert and oriented. Well appearing and in no acute distress. Eyes: Conjunctivae are normal. PERRL. EOMI. Head: Atraumatic.  Normal right-sided TM. Left TM with cerumen obscuring almost the entirety of the canal. Nose: No congestion/rhinnorhea. Mouth/Throat: Mucous membranes are moist.  No tonsillar, tongue or uvular swelling. Neck: No stridor.   Cardiovascular: Normal rate, regular rhythm.  Grossly normal heart sounds.   Respiratory: Normal respiratory effort.  No retractions. Lungs CTAB. Gastrointestinal: Soft and nontender. No distention. No abdominal bruits. No CVA tenderness. Musculoskeletal: No lower extremity tenderness nor edema.  No joint effusions. Neurologic:  Muffled voice without any obvious slurring.. Right-sided facial drooping at the right side of the mouth. Appears to be a central droop. No nystagmus. No ataxia on finger to nose testing. Skin:  Skin is warm, dry and intact. No rash noted. Psychiatric: Mood and affect are normal.   NIH Stroke Scale    Time: 1050pm Person Administering Scale: Arelia LongestSchaevitz,  Klara Stjames M  Administer stroke scale items in the order listed. Record performance in each category after each subscale exam. Do not go back and change scores. Follow directions provided for each exam technique. Scores should reflect what the patient does, not what the clinician thinks the patient can do. The clinician should record answers while administering the exam and work quickly. Except where indicated, the patient should not be coached (i.e., repeated requests to patient to make a special effort).   1a  Level of consciousness: 0=alert; keenly responsive  1b. LOC questions:  0=Performs both tasks correctly  1c. LOC commands: 0=Performs both tasks correctly  2.  Best Gaze: 0=normal  3.  Visual: 0=No visual loss  4. Facial Palsy: 1=Minor paralysis (flattened nasolabial fold, asymmetric on smiling)  5a.  Motor left arm: 0=No drift, limb holds 90 (or 45) degrees for full 10 seconds  5b.  Motor right arm: 0=No drift, limb holds 90 (or 45) degrees for full 10 seconds  6a. motor left leg: 0=No drift, limb holds 90 (or 45) degrees for full 10 seconds  6b  Motor right leg:  0=No drift, limb holds 90 (or 45) degrees for full 10 seconds  7. Limb Ataxia: 0=Absent  8.  Sensory: 0=Normal; no sensory loss  9. Best Language:  0=No aphasia, normal  10. Dysarthria: 1=Mild  to moderate, patient slurs at least some words and at worst, can be understood with some difficulty  11. Extinction and Inattention: 0=No abnormality  12. Distal motor function: 0=Normal   Total:   2    ____________________________________________   LABS (all labs ordered are listed, but only abnormal results are displayed)  Labs Reviewed  BASIC METABOLIC PANEL - Abnormal; Notable for the following:       Result Value   Glucose, Bld 134 (*)    GFR calc non Af Amer 52 (*)    GFR calc Af Amer 60 (*)    All other components within normal limits  URINALYSIS, COMPLETE (UACMP) WITH MICROSCOPIC - Abnormal; Notable for the following:    Color, Urine YELLOW (*)    APPearance CLEAR (*)    Protein, ur 100 (*)    All other components within normal limits  CBC  TROPONIN I  TROPONIN I  CBG MONITORING, ED   ____________________________________________  EKG  ED ECG REPORT I, Brittan Mapel,  Teena Irani, the attending physician, personally viewed and interpreted this ECG.   Date: 02/26/2016  EKG Time: 1614  Rate: 92  Rhythm: normal sinus rhythm  Axis: Normal  Intervals:left anterior fascicular block  ST&T Change: No ST segment elevation or depression. T-wave inversions in aVL as well as V2. No STEMI changes from the last EKG in the record. Baseline disturbance and V3.  ____________________________________________  RADIOLOGY    DG Chest 2 View (Final result)  Result time 02/26/16 20:58:29  Final result by Mitzi Hansen, MD (02/26/16 20:58:29)           Narrative:   CLINICAL DATA: 83 y/o F; dizziness and weakness.  EXAM: CHEST 2 VIEW  COMPARISON: 07/10/2013 chest radiograph  FINDINGS: Stable cardiac silhouette within normal limits given projection and technique. Aortic atherosclerosis with calcification. No focal consolidation of the lungs. Mild biapical pleuroparenchymal scarring. Multilevel degenerative changes of the thoracic spine. Expanded lungs with  flattened diaphragms compatible with COPD.  IMPRESSION: No active cardiopulmonary disease. Aortic atherosclerosis. Findings of COPD.   Electronically Signed By: Mitzi Hansen M.D. On: 02/26/2016 20:58            CT Head Wo Contrast (Final result)  Result time 02/26/16 20:49:04  Final result by Tonia Ghent, MD (02/26/16 20:49:04)           Narrative:   CLINICAL DATA: Acute onset of generalized weakness and slurred speech. Dizziness. Initial encounter.  EXAM: CT HEAD WITHOUT CONTRAST  TECHNIQUE: Contiguous axial images were obtained from the base of the skull through the vertex without intravenous contrast.  COMPARISON: CT of the head performed 10/25/2014  FINDINGS: Brain: No evidence of acute infarction, hemorrhage, hydrocephalus, extra-axial collection or mass lesion/mass effect.  Prominence of the ventricles and sulci reflects mild to moderate cortical volume loss. Scattered periventricular and subcortical white matter change likely reflects small vessel ischemic microangiopathy. Cerebellar atrophy is noted.  The brainstem and fourth ventricle are within normal limits. The basal ganglia are unremarkable in appearance. The cerebral hemispheres demonstrate grossly normal gray-white differentiation. No mass effect or midline shift is seen.  Vascular: No hyperdense vessel or unexpected calcification.  Skull: There is no evidence of fracture; visualized osseous structures are unremarkable in appearance.  Sinuses/Orbits: The orbits are within normal limits. The paranasal sinuses and mastoid air cells are well-aerated.  Other: No significant soft tissue abnormalities are seen.  IMPRESSION: 1. No acute intracranial pathology seen on CT. 2. Mild to moderate cortical volume loss and scattered small vessel ischemic microangiopathy.   Electronically Signed By: Roanna Raider M.D. On: 02/26/2016 20:49             ____________________________________________   PROCEDURES  Procedure(s) performed:   Procedures  Critical Care performed:   ____________________________________________   INITIAL IMPRESSION / ASSESSMENT AND PLAN / ED COURSE  Pertinent labs & imaging results that were available during my care of the patient were reviewed by me and considered in my medical decision making (see chart for details).  ----------------------------------------- 11:25 PM on 02/26/2016 -----------------------------------------  Patient with and an H show scale of 2. Is not within the TPA window. However, the patient has a high level of functioning. She lives alone at home. Will be admitted for further stroke workup. Given aspirin. Signed out to Dr. Emmit Pomfret.  The patient's family are aware of the plan and are willing to comply.   Clinical Course  ____________________________________________   FINAL CLINICAL IMPRESSION(S) / ED DIAGNOSES  CVA.    NEW MEDICATIONS STARTED DURING THIS VISIT:  New Prescriptions   No medications on file     Note:  This document was prepared using Dragon voice recognition software and may include unintentional dictation errors.    Myrna Blazer, MD 02/26/16 [phone]

## 2016-02-26 NOTE — ED Notes (Signed)
Reviewed pt's triage note, labs ordered

## 2016-02-26 NOTE — ED Notes (Signed)
Dr. Pershing ProudSchaevitz at the bedside for pt evaluation.

## 2016-02-26 NOTE — ED Triage Notes (Signed)
Pt arrives to ER via POV c/o weakness since this AM. Reports being dizzy as well. No falling. Pt alert and oriented X4, active, cooperative, pt in NAD. RR even and unlabored, color WNL.

## 2016-02-26 NOTE — ED Notes (Signed)
Spoke with Dr Pershing ProudSchaevitz regarding family's concerns over possible stroke; st pt lives alone and noted her having slurred speech but unsure of time frame; pt with no c/o pain, MAEW, PERRL; updated family on plan of care and voice good understanding

## 2016-02-27 ENCOUNTER — Inpatient Hospital Stay
Admit: 2016-02-27 | Discharge: 2016-02-27 | Disposition: A | Discharge status: Home / Self Care | Payer: Medicare Other | Attending: Family Medicine | Admitting: Family Medicine

## 2016-02-27 ENCOUNTER — Encounter: Payer: Self-pay | Admitting: *Deleted

## 2016-02-27 ENCOUNTER — Inpatient Hospital Stay: Payer: Medicare Other

## 2016-02-27 DIAGNOSIS — Z79899 Other long term (current) drug therapy: Secondary | ICD-10-CM | POA: Diagnosis not present

## 2016-02-27 DIAGNOSIS — R29810 Facial weakness: Secondary | ICD-10-CM | POA: Diagnosis present

## 2016-02-27 DIAGNOSIS — K5909 Other constipation: Secondary | ICD-10-CM | POA: Diagnosis present

## 2016-02-27 DIAGNOSIS — I248 Other forms of acute ischemic heart disease: Secondary | ICD-10-CM | POA: Diagnosis present

## 2016-02-27 DIAGNOSIS — K219 Gastro-esophageal reflux disease without esophagitis: Secondary | ICD-10-CM | POA: Diagnosis present

## 2016-02-27 DIAGNOSIS — N3281 Overactive bladder: Secondary | ICD-10-CM | POA: Diagnosis present

## 2016-02-27 DIAGNOSIS — I639 Cerebral infarction, unspecified: Secondary | ICD-10-CM | POA: Diagnosis present

## 2016-02-27 DIAGNOSIS — M810 Age-related osteoporosis without current pathological fracture: Secondary | ICD-10-CM | POA: Diagnosis present

## 2016-02-27 DIAGNOSIS — R4781 Slurred speech: Secondary | ICD-10-CM | POA: Diagnosis present

## 2016-02-27 DIAGNOSIS — I739 Peripheral vascular disease, unspecified: Secondary | ICD-10-CM | POA: Diagnosis present

## 2016-02-27 DIAGNOSIS — R1310 Dysphagia, unspecified: Secondary | ICD-10-CM | POA: Diagnosis present

## 2016-02-27 DIAGNOSIS — E039 Hypothyroidism, unspecified: Secondary | ICD-10-CM | POA: Diagnosis present

## 2016-02-27 DIAGNOSIS — R29707 NIHSS score 7: Secondary | ICD-10-CM | POA: Diagnosis present

## 2016-02-27 DIAGNOSIS — R7303 Prediabetes: Secondary | ICD-10-CM | POA: Diagnosis present

## 2016-02-27 DIAGNOSIS — R471 Dysarthria and anarthria: Secondary | ICD-10-CM | POA: Diagnosis present

## 2016-02-27 DIAGNOSIS — R7989 Other specified abnormal findings of blood chemistry: Secondary | ICD-10-CM | POA: Diagnosis present

## 2016-02-27 DIAGNOSIS — Z66 Do not resuscitate: Secondary | ICD-10-CM | POA: Diagnosis present

## 2016-02-27 DIAGNOSIS — G8191 Hemiplegia, unspecified affecting right dominant side: Secondary | ICD-10-CM | POA: Diagnosis present

## 2016-02-27 DIAGNOSIS — I63312 Cerebral infarction due to thrombosis of left middle cerebral artery: Secondary | ICD-10-CM | POA: Diagnosis not present

## 2016-02-27 DIAGNOSIS — I10 Essential (primary) hypertension: Secondary | ICD-10-CM | POA: Diagnosis present

## 2016-02-27 DIAGNOSIS — E785 Hyperlipidemia, unspecified: Secondary | ICD-10-CM | POA: Diagnosis present

## 2016-02-27 DIAGNOSIS — G459 Transient cerebral ischemic attack, unspecified: Secondary | ICD-10-CM | POA: Diagnosis present

## 2016-02-27 DIAGNOSIS — G8111 Spastic hemiplegia affecting right dominant side: Secondary | ICD-10-CM | POA: Diagnosis present

## 2016-02-27 LAB — LIPID PANEL
CHOL/HDL RATIO: 4.4 ratio
Cholesterol: 205 mg/dL — ABNORMAL HIGH (ref 0–200)
HDL: 47 mg/dL (ref >40)
LDL Cholesterol: 127 mg/dL — ABNORMAL HIGH (ref 0–99)
Triglycerides: 156 mg/dL — ABNORMAL HIGH (ref <150)
VLDL: 31 mg/dL (ref 0–40)

## 2016-02-27 LAB — URINE DRUG SCREEN, QUALITATIVE (ARMC ONLY)
AMPHETAMINES, UR SCREEN: NOT DETECTED
BENZODIAZEPINE, UR SCRN: NOT DETECTED
Barbiturates, Ur Screen: NOT DETECTED
COCAINE METABOLITE, UR ~~LOC~~: NOT DETECTED
Cannabinoid 50 Ng, Ur ~~LOC~~: NOT DETECTED
MDMA (Ecstasy)Ur Screen: NOT DETECTED
METHADONE SCREEN, URINE: NOT DETECTED
OPIATE, UR SCREEN: NOT DETECTED
Phencyclidine (PCP) Ur S: NOT DETECTED
Tricyclic, Ur Screen: NOT DETECTED

## 2016-02-27 LAB — URINALYSIS, DIPSTICK ONLY
BILIRUBIN URINE: NEGATIVE
Glucose, UA: NEGATIVE mg/dL
HGB URINE DIPSTICK: NEGATIVE
Ketones, ur: NEGATIVE mg/dL
Nitrite: NEGATIVE
Protein, ur: 30 mg/dL — AB
SPECIFIC GRAVITY, URINE: 1.011 (ref 1.005–1.030)
pH: 7 (ref 5.0–8.0)

## 2016-02-27 LAB — CBC
HEMATOCRIT: 42.5 % (ref 35.0–47.0)
HEMOGLOBIN: 14.5 g/dL (ref 12.0–16.0)
MCH: 31.3 pg (ref 26.0–34.0)
MCHC: 34 g/dL (ref 32.0–36.0)
MCV: 92.2 fL (ref 80.0–100.0)
Platelets: 203 10*3/uL (ref 150–440)
RBC: 4.62 MIL/uL (ref 3.80–5.20)
RDW: 14 % (ref 11.5–14.5)
WBC: 10.3 10*3/uL (ref 3.6–11.0)

## 2016-02-27 LAB — CREATININE, SERUM
Creatinine, Ser: 1.04 mg/dL — ABNORMAL HIGH (ref 0.44–1.00)
GFR, EST AFRICAN AMERICAN: 54 mL/min — AB (ref >60)
GFR, EST NON AFRICAN AMERICAN: 46 mL/min — AB (ref >60)

## 2016-02-27 LAB — MRSA PCR SCREENING: MRSA BY PCR: NEGATIVE

## 2016-02-27 LAB — TROPONIN I
Troponin I: <0.03 ng/mL (ref <0.03)
Troponin I: <0.03 ng/mL (ref <0.03)
Troponin I: 0.04 ng/mL (ref <0.03)

## 2016-02-27 LAB — ECHOCARDIOGRAM COMPLETE
Height: 67 in
WEIGHTICAEL: 2345.69 [oz_av]

## 2016-02-27 LAB — GLUCOSE, CAPILLARY: Glucose-Capillary: 108 mg/dL — ABNORMAL HIGH (ref 65–99)

## 2016-02-27 MED ORDER — HYDRALAZINE HCL 20 MG/ML IJ SOLN
10.0000 mg | Freq: Four times a day (QID) | INTRAMUSCULAR | Status: DC | PRN
  Administered 2016-02-27 – 2016-02-28 (×2): 10 mg via INTRAVENOUS
  Filled 2016-02-27 (×2): qty 1

## 2016-02-27 MED ORDER — VITAMIN D 1000 UNITS PO TABS
500.0000 [IU] | ORAL_TABLET | Freq: Every day | ORAL | Status: DC
  Administered 2016-02-28 – 2016-02-29 (×2): 500 [IU] via ORAL
  Filled 2016-02-27: qty 1
  Filled 2016-02-27: qty 0.5
  Filled 2016-02-27: qty 1

## 2016-02-27 MED ORDER — ENOXAPARIN SODIUM 40 MG/0.4ML ~~LOC~~ SOLN
40.0000 mg | SUBCUTANEOUS | Status: DC
  Administered 2016-02-27 – 2016-02-28 (×2): 40 mg via SUBCUTANEOUS
  Filled 2016-02-27 (×2): qty 0.4

## 2016-02-27 MED ORDER — STROKE: EARLY STAGES OF RECOVERY BOOK
Freq: Once | Status: AC
  Administered 2016-02-27: 14:00:00

## 2016-02-27 MED ORDER — SODIUM CHLORIDE 0.9 % IV SOLN
INTRAVENOUS | Status: DC
  Administered 2016-02-27 – 2016-02-29 (×4): via INTRAVENOUS

## 2016-02-27 MED ORDER — ASPIRIN 81 MG PO CHEW
81.0000 mg | CHEWABLE_TABLET | Freq: Every day | ORAL | Status: DC
  Administered 2016-02-27 – 2016-02-29 (×3): 81 mg via ORAL
  Filled 2016-02-27 (×3): qty 1

## 2016-02-27 MED ORDER — ENOXAPARIN SODIUM 40 MG/0.4ML ~~LOC~~ SOLN
SUBCUTANEOUS | Status: AC
  Administered 2016-02-27: 40 mg via SUBCUTANEOUS
  Filled 2016-02-27: qty 0.4

## 2016-02-27 MED ORDER — ACETAMINOPHEN 650 MG RE SUPP: 650.0000 mg | RECTAL | Status: DC | PRN

## 2016-02-27 MED ORDER — VITAMIN D (CHOLECALCIFEROL) 10 MCG (400 UNIT) PO CAPS: 400.0000 [IU] | ORAL_CAPSULE | Freq: Every day | ORAL | Status: DC

## 2016-02-27 MED ORDER — ASPIRIN EC 81 MG PO TBEC: 81.0000 mg | DELAYED_RELEASE_TABLET | Freq: Every day | ORAL | Status: DC

## 2016-02-27 MED ORDER — LEVOTHYROXINE SODIUM 50 MCG PO TABS
75.0000 ug | ORAL_TABLET | Freq: Every day | ORAL | Status: DC
  Administered 2016-02-28 – 2016-02-29 (×2): 75 ug via ORAL
  Filled 2016-02-27 (×2): qty 2

## 2016-02-27 MED ORDER — DOCUSATE SODIUM 100 MG PO CAPS
100.0000 mg | ORAL_CAPSULE | Freq: Every day | ORAL | Status: DC
Start: 2016-02-27 — End: 2016-02-29
  Administered 2016-02-27: 100 mg via ORAL
  Filled 2016-02-27 (×2): qty 1

## 2016-02-27 MED ORDER — ORAL CARE MOUTH RINSE
15.0000 mL | Freq: Two times a day (BID) | OROMUCOSAL | Status: DC
Start: 2016-02-27 — End: 2016-02-29
  Administered 2016-02-27 – 2016-02-29 (×5): 15 mL via OROMUCOSAL

## 2016-02-27 MED ORDER — ADULT MULTIVITAMIN W/MINERALS CH
1.0000 | ORAL_TABLET | Freq: Every day | ORAL | Status: DC
  Administered 2016-02-28 – 2016-02-29 (×2): 1 via ORAL
  Filled 2016-02-27 (×2): qty 1

## 2016-02-27 MED ORDER — ACETAMINOPHEN 160 MG/5ML PO SOLN: 650.0000 mg | ORAL | Status: DC | PRN

## 2016-02-27 MED ORDER — PANTOPRAZOLE SODIUM 40 MG PO TBEC
40.0000 mg | DELAYED_RELEASE_TABLET | Freq: Every day | ORAL | Status: DC
  Administered 2016-02-27 – 2016-02-29 (×3): 40 mg via ORAL
  Filled 2016-02-27 (×3): qty 1

## 2016-02-27 MED ORDER — ACETAMINOPHEN 325 MG PO TABS
650.0000 mg | ORAL_TABLET | ORAL | Status: DC | PRN
  Administered 2016-02-27 – 2016-02-29 (×4): 650 mg via ORAL
  Filled 2016-02-27 (×4): qty 2

## 2016-02-27 MED ORDER — ATORVASTATIN CALCIUM 20 MG PO TABS
40.0000 mg | ORAL_TABLET | Freq: Every day | ORAL | Status: DC
  Administered 2016-02-27 – 2016-02-28 (×2): 40 mg via ORAL
  Filled 2016-02-27 (×2): qty 2

## 2016-02-27 MED ORDER — SENNOSIDES-DOCUSATE SODIUM 8.6-50 MG PO TABS
1.0000 | ORAL_TABLET | Freq: Every evening | ORAL | Status: DC | PRN
  Administered 2016-02-28 – 2016-02-29 (×2): 1 via ORAL
  Filled 2016-02-27 (×2): qty 1

## 2016-02-27 NOTE — Evaluation (Signed)
Physical Therapy Evaluation Patient Details Name: Christina LungMildred C Heath MRN: [id number] DOB: 1924/05/28 Today's Date: 02/27/2016   History of Present Illness  Pt is an 83 y.o. female presenting to hospital with weakness (R sided), dizziness, slurred speech, and R facial droop.  Pt admitted with acute basal ganglia infarct.  PMH includes htn, vertigo, overactive bladder.  Clinical Impression  Prior to hospital admission, pt was independent (occasionally used SPC outside on uneven ground).  Pt lives alone on main floor of home (pt's family drives pt for appointments/errands).  Pt demonstrates R facial droop (pt soft spoken during session), R UE weakness, R LE weakness, and question tone vs pt resistance (d/t difficulty following commands for testing at times) in R UE and R LE.  Currently pt is mod to max assist with bed mobility and transfers (pt requiring blocking of R>L  knee d/t buckling).  Pt appearing very motivated to participate in PT.  Pt would benefit from skilled PT to address noted impairments and functional limitations.  Recommend pt discharge to inpatient rehab when medically appropriate.    Follow Up Recommendations CIR    Equipment Recommendations   (TBD)    Recommendations for Other Services       Precautions / Restrictions Precautions Precautions: Fall Precaution Comments: Aspiration; Seizure Restrictions Weight Bearing Restrictions: No      Mobility  Bed Mobility Overal bed mobility: Needs Assistance Bed Mobility: Supine to Sit;Sit to Supine     Supine to sit: Mod assist;HOB elevated Sit to supine: Max assist;HOB elevated   General bed mobility comments: assist for R LE and trunk supine to/from sit; vc's for technique required  Transfers Overall transfer level: Needs assistance Equipment used: None Transfers: Sit to/from [employer]GI CorporationStand;Stand Pivot Transfers Sit to Stand: Mod assist Stand pivot transfers: Mod assist;Max assist       General transfer comment: mod  assist to stand; mod to max assist stand step turn ICU bed to general floor bed with need to block R>L knee d/t buckling; vc's for technique required  Ambulation/Gait             General Gait Details: deferred d/t pt transferring to general floor at that moment limiting session  Stairs            Wheelchair Mobility    Modified Rankin (Stroke Patients Only)       Balance Overall balance assessment: Needs assistance Sitting-balance support: Single extremity supported;Feet unsupported Sitting balance-Leahy Scale: Poor Sitting balance - Comments: intermittent posterior lean requiring min assist to correct (otherwise pt CGA) Postural control: Posterior lean   Standing balance-Leahy Scale: Poor Standing balance comment: posterior R lean in standing requiring min to mod assist to correct                             Pertinent Vitals/Pain Pain Assessment: No/denies pain  HR 89-111 bpm during session.    Home Living Family/patient expects to be discharged to:: Private residence Living Arrangements: Alone   Type of Home: House       Home Layout: Two level;Able to live on main level with bedroom/bathroom Home Equipment: Gilmer MorCane - single point      Prior Function Level of Independence: Independent with assistive device(s)         Comments: Pt independent (except does not drive) and occasionally uses SPC outside on uneven ground.  Pt denies any falls in past 6 months.     Hand Dominance  Extremity/Trunk Assessment   Upper Extremity Assessment Upper Extremity Assessment: Defer to OT evaluation (R UE weakness noted and question R UE tone vs pt resistance)    Lower Extremity Assessment Lower Extremity Assessment: RLE deficits/detail;LLE deficits/detail RLE Deficits / Details: hip flexion at least 3-/5; knee extension at least 3-/5; knee flexion at least 3-/5; DF at least 2/5; tone vs pt resistance noted R hip >R knee> R ankle LLE Deficits /  Details: strength and ROM WFL       Communication   Communication: No difficulties  Cognition Arousal/Alertness: Awake/alert Behavior During Therapy: WFL for tasks assessed/performed Overall Cognitive Status: Within Functional Limits for tasks assessed                      General Comments General comments (skin integrity, edema, etc.): Pt's 2 son's present during session.  Nursing cleared pt for participation in physical therapy.  Pt agreeable to PT session.    Exercises     Assessment/Plan    PT Assessment Patient needs continued PT services  PT Problem List Decreased strength;Decreased balance;Decreased mobility;Decreased coordination;Decreased knowledge of use of DME;Impaired tone          PT Treatment Interventions DME instruction;Gait training;Stair training;Functional mobility training;Therapeutic activities;Therapeutic exercise;Balance training;Neuromuscular re-education;Patient/family education    PT Goals (Current goals can be found in the Care Plan section)  Acute Rehab PT Goals Patient Stated Goal: to get stronger and be able to walk again PT Goal Formulation: With patient Time For Goal Achievement: 03/12/16 Potential to Achieve Goals: Good    Frequency 7X/week   Barriers to discharge Decreased caregiver support      Co-evaluation               End of Session Equipment Utilized During Treatment: Gait belt Activity Tolerance: Patient tolerated treatment well Patient left: in bed;with nursing/sitter in room;with family/visitor present (pt being transported to general floor ) Nurse Communication: Mobility status;Precautions         Time: 1610-9604 PT Time Calculation (min) (ACUTE ONLY): 25 min   Charges:   PT Evaluation $PT Eval Low Complexity: 1 Procedure PT Treatments $Therapeutic Activity: 8-22 mins   PT G CodesHendricks Limes 18-Mar-2016, 5:15 PM Hendricks Limes, PT [phone]

## 2016-02-27 NOTE — Progress Notes (Signed)
OT Cancellation Note  Patient Details Name: Christina Bradshaw MRN: [id number] DOB: 07-Feb-1925   Cancelled Treatment:    Reason Eval/Treat Not Completed: Medical issues which prohibited therapy (Pt. BP 203/78. Will continue to monitor and eval when appropriate.)  Olegario MessierElaine Greta Yung, MS, OTR/L 02/27/2016, 1:26 PM

## 2016-02-27 NOTE — Evaluation (Signed)
Clinical/Bedside Swallow Evaluation Patient Details  Name: Christina LungMildred C Bradshaw MRN: [id number] Date of Birth: 29-Dec-1924  Today's Date: 02/27/2016 Time: SLP Start Time (ACUTE ONLY): 1400 SLP Stop Time (ACUTE ONLY): 1500 SLP Time Calculation (min) (ACUTE ONLY): 60 min  Past Medical History:  Past Medical History:  Diagnosis Date  . Hypertension   . Stroke [facility](HCC)    Past Surgical History:  Past Surgical History:  Procedure Laterality Date  . ABDOMINAL HYSTERECTOMY     HPI:  Pt is a 83 y.o. female with a known history of hypertension, vertigo, hyperlipidemia, GERD, osteoporosis, overactive bladder, hypothyroidism was last seen normal when she spoke to her daughter about 24 hours prior to arrival. Patient slept through the night, but in the morning she received a call from her daughter-in-law who commented that her speech was weak and that the patient did not sound like herself. The patient stated that she felt dizzy so she took some meclizine and took a nap. Patient's son then called later in the day and found her to still sound unlike herself so he went to her home, found her to be weak with speech disturbance and a right sided facial droop. She states that despite her speech difficulty she has been ambulating about her home and did not notice any focal weakness, confusion, sensory disturbance. Her daughter noted that she had difficulty writing her name in triage. Currently, pt is verbal but w/ low volume of speech mildy dysarthric but understandable. She followed basic instructions; y/n questions re: self.    Assessment / Plan / Recommendation Clinical Impression  Pt appears at increased risk for aspiration secondary to oropharyngeal phase dysphagia noted during this evaluation. Pt was given trials of ice chips, thin liquids via Cup, Nectar consistency liquids via TSP, and small amounts of puree. Pt exhibited overt coughing w/ the trials of thin liquids but appeared to adequately tolerate TSP  trials of Nectar liquids w/ no immediate coughing or decline in status. Same was noted w/ trials of ice chips (single) and puree. Strict aspiration precautions were followed and education given on such to Sons in room for any oral intake at meals. Due to pt's presentation at this time, recommend a Dysphagia level 1 diet w/ Nectar liquids w/ pills given in Puree - Crushed as able; strict aspiration precautions. Feeding Support at all meals monitoring status w/ po's.     Aspiration Risk  Moderate aspiration risk    Diet Recommendation  Dysphagia level 1 w/ Nectar liquids; strict aspiration precautions; feeding support at all meals w/ monitoring of all po's.  Medication Administration: Crushed with puree    Other  Recommendations Recommended Consults:  (Dietician) Oral Care Recommendations: Oral care BID;Staff/trained caregiver to provide oral care Other Recommendations: Order thickener from pharmacy;Prohibited food (jello, ice cream, thin soups);Remove water pitcher;Have oral suction available   Follow up Recommendations Skilled Nursing facility (TBD)      Frequency and Duration min 3x week  2 weeks       Prognosis Prognosis for Safe Diet Advancement: Fair      Swallow Study   General Date of Onset: 02/26/16 HPI: Pt is a 83 y.o. female with a known history of hypertension, vertigo, hyperlipidemia, GERD, osteoporosis, overactive bladder, hypothyroidism was last seen normal when she spoke to her daughter about 24 hours prior to arrival. Patient slept through the night, but in the morning she received a call from her daughter-in-law who commented that her speech was weak and that the patient did not  sound like herself. The patient stated that she felt dizzy so she took some meclizine and took a nap. Patient's son then called later in the day and found her to still sound unlike herself so he went to her home, found her to be weak with speech disturbance and a right sided facial droop. She states  that despite her speech difficulty she has been ambulating about her home and did not notice any focal weakness, confusion, sensory disturbance. Her daughter noted that she had difficulty writing her name in triage. Currently, pt is verbal but w/ low volume of speech mildy dysarthric but understandable. She followed basic instructions; y/n questions re: self.  Type of Study: Bedside Swallow Evaluation Previous Swallow Assessment: none indicated Diet Prior to this Study: Regular;Thin liquids Temperature Spikes Noted: No (wbc 10.3) Respiratory Status: Room air History of Recent Intubation: No Behavior/Cognition: Alert;Cooperative;Pleasant mood;Requires cueing;Distractible Oral Cavity Assessment: Dry Oral Care Completed by SLP: Recent completion by staff Oral Cavity - Dentition: Dentures, top;Dentures, bottom (in place) Vision: Functional for self-feeding Self-Feeding Abilities: Able to feed self;Needs assist;Needs set up Patient Positioning: Upright in bed Baseline Vocal Quality: Low vocal intensity Volitional Cough: Strong Volitional Swallow: Able to elicit    Oral/Motor/Sensory Function Overall Oral Motor/Sensory Function: Mild impairment (-Moderate impairment) Facial ROM: Reduced right Facial Symmetry: Abnormal symmetry right Facial Strength: Reduced right Lingual ROM: Reduced right;Suspected CN XII (hypoglossal) dysfunction Lingual Symmetry: Abnormal symmetry right;Suspected CN XII (hypoglossal) dysfunction Lingual Strength: Suspected CN XII (hypoglossal) dysfunction (especially base of tongue) Mandible: Within Functional Limits   Ice Chips Ice chips: Within functional limits Presentation: Spoon (fed; 3 trials)   Thin Liquid Thin Liquid: Impaired Presentation: Cup;Self Fed (assisted; 2 trials) Oral Phase Impairments: Reduced labial seal Oral Phase Functional Implications: Right anterior spillage Pharyngeal  Phase Impairments: Cough - Immediate (x1/2 trials)    Nectar Thick Nectar  Thick Liquid: Within functional limits Presentation: Spoon (fed; 6 trials)   Honey Thick Honey Thick Liquid: Not tested   Puree Puree: Within functional limits Presentation: Spoon (fed; 5 trials)   Solid   GO   Solid: Not tested Other Comments: ongoing assessment         Jerilynn Som, MS, CCC-SLP [facility] Seamans 02/27/2016,3:10 PM

## 2016-02-27 NOTE — Consult Note (Signed)
Referring Physician: Sherryll Burger    Chief Complaint: Right sided weakness  HPI: Christina Bradshaw is an 83 y.o. female last spoke to her family on the night of 1/8 and was at her baseline.  When they spoke to her the next morning her speech was weak and she was complaining of dizziness.  She took some Meclizine and napped.  Later in the day her speech was still not at baseline and her family felt that she had a facial droop as well.  Patient was brought in for evaluation at that time.  Initial NIHSS of 7.  Date last known well: Date: 02/25/2016 Time last known well: Time: 19:00 tPA Given: No: Outside time window  Past Medical History:  Diagnosis Date  . Hypertension     Past Surgical History:  Procedure Laterality Date  . ABDOMINAL HYSTERECTOMY      Family history: Mother with breast cancer.  Both parents deceased.    Social History:  reports that she has never smoked. She does not have any smokeless tobacco history on file. She reports that she does not drink alcohol. Her drug history is not on file.  Allergies: No Known Allergies  Medications:  I have reviewed the patient's current medications. Prior to Admission:  Prescriptions Prior to Admission  Medication Sig Dispense Refill Last Dose  . amLODipine (NORVASC) 5 MG tablet Take 5 mg by mouth daily.   10/25/2014 at Unknown time  . docusate sodium (COLACE) 100 MG capsule Take 100 mg by mouth at bedtime.   10/24/2014 at Unknown time  . levothyroxine (SYNTHROID, LEVOTHROID) 75 MCG tablet Take 75 mcg by mouth daily before breakfast.   10/25/2014 at Unknown time  . lisinopril (PRINIVIL,ZESTRIL) 40 MG tablet Take 40 mg by mouth daily.   10/25/2014 at Unknown time  . metoprolol tartrate (LOPRESSOR) 25 MG tablet Take 25 mg by mouth 2 (two) times daily.   10/25/2014 at 0730  . Multiple Vitamin (MULTIVITAMIN WITH MINERALS) TABS tablet Take 1 tablet by mouth daily.   10/25/2014 at Unknown time  . omeprazole (PRILOSEC) 20 MG capsule Take 20 mg by mouth  daily.   10/25/2014 at Unknown time  . Vitamin D, Cholecalciferol, 400 UNITS CAPS Take 400 Units by mouth daily.   10/25/2014 at Unknown time  . Influenza Virus Vaccine Split (FLUZONE IM) Inject 1 Dose into the muscle once.   Completed Course at Unknown time   Scheduled: .  stroke: mapping our early stages of recovery book   Does not apply Once  . aspirin EC  81 mg Oral Daily  . cholecalciferol  500 Units Oral Daily  . docusate sodium  100 mg Oral QHS  . enoxaparin (LOVENOX) injection  40 mg Subcutaneous Q24H  . levothyroxine  75 mcg Oral QAC breakfast  . multivitamin with minerals  1 tablet Oral Daily  . pantoprazole  40 mg Oral Daily    ROS: History obtained from the patient  General ROS: fatigue Psychological ROS: negative for - behavioral disorder, hallucinations, memory difficulties, mood swings or suicidal ideation Ophthalmic ROS: negative for - blurry vision, double vision, eye pain or loss of vision ENT ROS: negative for - epistaxis, nasal discharge, oral lesions, sore throat, tinnitus or vertigo Allergy and Immunology ROS: negative for - hives or itchy/watery eyes Hematological and Lymphatic ROS: negative for - bleeding problems, bruising or swollen lymph nodes Endocrine ROS: negative for - galactorrhea, hair pattern changes, polydipsia/polyuria or temperature intolerance Respiratory ROS: negative for - cough, hemoptysis, shortness of  breath or wheezing Cardiovascular ROS: negative for - chest pain, dyspnea on exertion, edema or irregular heartbeat Gastrointestinal ROS: negative for - abdominal pain, diarrhea, hematemesis, nausea/vomiting or stool incontinence Genito-Urinary ROS: negative for - dysuria, hematuria, incontinence or urinary frequency/urgency Musculoskeletal ROS: negative for - joint swelling or muscular weakness Neurological ROS: as noted in HPI Dermatological ROS: negative for rash and skin lesion changes  Physical Examination: Blood pressure (!) 173/80, pulse  92, temperature 97.8 F (36.6 C), temperature source Oral, resp. rate 17, height 5\' 7"  (1.702 m), weight 66.5 kg (146 lb 9.7 oz), SpO2 98 %.  HEENT-  Normocephalic, no lesions, without obvious abnormality.  Normal external eye and conjunctiva.  Normal TM's bilaterally.  Normal auditory canals and external ears. Normal external nose, mucus membranes and septum.  Normal pharynx. Cardiovascular- S1, S2 normal, pulses palpable throughout   Lungs- chest clear, no wheezing, rales, normal symmetric air entry Abdomen- soft, non-tender; bowel sounds normal; no masses,  no organomegaly Extremities- no edema Lymph-no adenopathy palpable Musculoskeletal-no joint tenderness, deformity or swelling Skin-warm and dry, no hyperpigmentation, vitiligo, or suspicious lesions  Neurological Examination Mental Status: Alert, thought content appropriate.  Noted to have some word finding issues.  Able to follow 3 step commands without difficulty. Cranial Nerves: II: Discs flat bilaterally; Visual fields grossly normal, pupils equal, round, reactive to light and accommodation III,IV, VI: ptosis not present, extra-ocular motions intact bilaterally V,VII: right facial droop, facial light touch sensation normal bilaterally VIII: hearing normal bilaterally IX,X: gag reflex reduced XI: bilateral shoulder shrug XII: midline tongue extension Motor: Right : Upper extremity   3/5    Left:     Upper extremity   5/5  Lower extremity   3/5     Lower extremity   5/5 Tone and bulk:normal tone throughout; no atrophy noted Sensory: Pinprick and light touch intact throughout, bilaterally Deep Tendon Reflexes: 2+ and symmetric with absent AJ's bilaterally Plantars: Right: upgoing   Left: upgoing Cerebellar: Normal finger-to-nose and normal heel-to-shin testing  Gait: not tested due to safety concerns   Laboratory Studies:  Basic Metabolic Panel:  Recent Labs Lab 02/26/16 1610 02/27/16 0341  NA 137  --   K 4.4  --    CL 103  --   CO2 25  --   GLUCOSE 134*  --   BUN 19  --   CREATININE 0.95 1.04*  CALCIUM 9.8  --     Liver Function Tests: No results for input(s): AST, ALT, ALKPHOS, BILITOT, PROT, ALBUMIN in the last 168 hours. No results for input(s): LIPASE, AMYLASE in the last 168 hours. No results for input(s): AMMONIA in the last 168 hours.  CBC:  Recent Labs Lab 02/26/16 1610 02/27/16 0341  WBC 9.4 10.3  HGB 15.8 14.5  HCT 47.0 42.5  MCV 92.6 92.2  PLT 226 203    Cardiac Enzymes:  Recent Labs Lab 02/26/16 1610 02/26/16 2015 02/27/16 0341 02/27/16 0921  TROPONINI <0.03 <0.03 <0.03 <0.03    BNP: Invalid input(s): POCBNP  CBG:  Recent Labs Lab 02/27/16 1142  GLUCAP 108*    Microbiology: No results found for this or any previous visit.  Coagulation Studies: No results for input(s): LABPROT, INR in the last 72 hours.  Urinalysis:  Recent Labs Lab 02/26/16 1610 02/27/16 0630  COLORURINE YELLOW* YELLOW*  LABSPEC 1.009 1.011  PHURINE 7.0 7.0  GLUCOSEU NEGATIVE NEGATIVE  HGBUR NEGATIVE NEGATIVE  BILIRUBINUR NEGATIVE NEGATIVE  KETONESUR NEGATIVE NEGATIVE  PROTEINUR 100* 30*  NITRITE NEGATIVE  NEGATIVE  LEUKOCYTESUR NEGATIVE SMALL*    Lipid Panel:    Component Value Date/Time   CHOL 205 (H) 02/27/2016 0921   CHOL 159 07/11/2013 0516   TRIG 156 (H) 02/27/2016 0921   TRIG 135 07/11/2013 0516   HDL 47 02/27/2016 0921   HDL 45 07/11/2013 0516   CHOLHDL 4.4 02/27/2016 0921   VLDL 31 02/27/2016 0921   VLDL 27 07/11/2013 0516   LDLCALC 127 (H) 02/27/2016 0921   LDLCALC 87 07/11/2013 0516    HgbA1C:  Lab Results  Component Value Date   HGBA1C 6.2 07/11/2013    Urine Drug Screen:     Component Value Date/Time   LABOPIA NONE DETECTED 02/27/2016 0630   COCAINSCRNUR NONE DETECTED 02/27/2016 0630   LABBENZ NONE DETECTED 02/27/2016 0630   AMPHETMU NONE DETECTED 02/27/2016 0630   THCU NONE DETECTED 02/27/2016 0630   LABBARB NONE DETECTED 02/27/2016  0630    Alcohol Level: No results for input(s): ETH in the last 168 hours.  Other results: EKG: normal sinus rhythm at 92 bpm.  Imaging: Dg Chest 2 View  Result Date: 02/26/2016 CLINICAL DATA:  83 y/o  F; dizziness and weakness. EXAM: CHEST  2 VIEW COMPARISON:  07/10/2013 chest radiograph FINDINGS: Stable cardiac silhouette within normal limits given projection and technique. Aortic atherosclerosis with calcification. No focal consolidation of the lungs. Mild biapical pleuroparenchymal scarring. Multilevel degenerative changes of the thoracic spine. Expanded lungs with flattened diaphragms compatible with COPD. IMPRESSION: No active cardiopulmonary disease. Aortic atherosclerosis. Findings of COPD. Electronically Signed   By: Mitzi HansenLance  Furusawa-Stratton M.D.   On: 02/26/2016 20:58   Ct Head Wo Contrast  Result Date: 02/26/2016 CLINICAL DATA:  Acute onset of generalized weakness and slurred speech. Dizziness. Initial encounter. EXAM: CT HEAD WITHOUT CONTRAST TECHNIQUE: Contiguous axial images were obtained from the base of the skull through the vertex without intravenous contrast. COMPARISON:  CT of the head performed 10/25/2014 FINDINGS: Brain: No evidence of acute infarction, hemorrhage, hydrocephalus, extra-axial collection or mass lesion/mass effect. Prominence of the ventricles and sulci reflects mild to moderate cortical volume loss. Scattered periventricular and subcortical white matter change likely reflects small vessel ischemic microangiopathy. Cerebellar atrophy is noted. The brainstem and fourth ventricle are within normal limits. The basal ganglia are unremarkable in appearance. The cerebral hemispheres demonstrate grossly normal gray-white differentiation. No mass effect or midline shift is seen. Vascular: No hyperdense vessel or unexpected calcification. Skull: There is no evidence of fracture; visualized osseous structures are unremarkable in appearance. Sinuses/Orbits: The orbits are within  normal limits. The paranasal sinuses and mastoid air cells are well-aerated. Other: No significant soft tissue abnormalities are seen. IMPRESSION: 1. No acute intracranial pathology seen on CT. 2. Mild to moderate cortical volume loss and scattered small vessel ischemic microangiopathy. Electronically Signed   By: Roanna RaiderJeffery  Chang M.D.   On: 02/26/2016 20:49   Mr Brain Wo Contrast  Result Date: 02/27/2016 CLINICAL DATA:  Stroke EXAM: MRI HEAD WITHOUT CONTRAST MRA HEAD WITHOUT CONTRAST TECHNIQUE: Multiplanar, multiecho pulse sequences of the brain and surrounding structures were obtained without intravenous contrast. Angiographic images of the head were obtained using MRA technique without contrast. COMPARISON:  CT 02/26/2016 FINDINGS: MRI HEAD FINDINGS Brain: Acute infarct left corona radiata extending into the putamen. Small area of acute infarct body of the caudate on the left. No other areas of acute infarct. Mild generalized atrophy. Moderate chronic microvascular ischemic changes in the white matter and pons. Negative for hemorrhage or mass. No hydrocephalus or shift  of the midline structures. Skull and upper cervical spine: Negative Sinuses/Orbits: Mild mucosal edema in the paranasal sinuses. Bilateral lens replacement. Other: None MRA HEAD FINDINGS Distal right vertebral artery patent to the basilar. Left vertebral artery non dominant and patent to the basilar. Basilar widely patent. Right PICA patent. Left PICA not visualized. AICA, superior cerebellar arteries patent bilaterally. Fetal origin of the posterior cerebral artery bilaterally with hypoplastic distal basilar. Internal carotid artery patent bilaterally without significant stenosis. Hypoplastic left A1 segment. Both anterior cerebral arteries supplied from the right. Middle cerebral arteries patent bilaterally. Negative for cerebral aneurysm. IMPRESSION: Acute infarct in the deep white matter on the left extending into the caudate and putamen.  Atrophy with moderate chronic microvascular ischemia No significant intracranial stenosis.  No large vessel occlusion. Electronically Signed   By: Marlan Palau M.D.   On: 02/27/2016 11:01   US Carotid Bilateral (at [facility] And Ap Only)  Result Date: 02/27/2016 CLINICAL DATA:  Stroke. History of hypertension, syncope, visual disturbance, hyperlipidemia. EXAM: BILATERAL CAROTID DUPLEX ULTRASOUND TECHNIQUE: Wallace Cullens scale imaging, color Doppler and duplex ultrasound were performed of bilateral carotid and vertebral arteries in the neck. COMPARISON:  None. FINDINGS: Criteria: Quantification of carotid stenosis is based on velocity parameters that correlate the residual internal carotid diameter with NASCET-based stenosis levels, using the diameter of the distal internal carotid lumen as the denominator for stenosis measurement. The following velocity measurements were obtained: RIGHT ICA:  126/19 cm/sec CCA:  77/12 cm/sec SYSTOLIC ICA/CCA RATIO:  1.6 DIASTOLIC ICA/CCA RATIO:  1.6 ECA:  93 cm/sec LEFT ICA:  109/24 cm/sec CCA:  94/14 cm/sec SYSTOLIC ICA/CCA RATIO:  1.2 DIASTOLIC ICA/CCA RATIO:  1.6 ECA:  152 cm/sec RIGHT CAROTID ARTERY: Minimal atherosclerotic change in the carotid bulb without significant stenosis identified. Normal homogeneous flow without significant turbulence. RIGHT VERTEBRAL ARTERY:  Antegrade flow direction is demonstrated. LEFT CAROTID ARTERY: Mild areas of calcific atherosclerotic change in the carotid bulb. No significant stenosis identified. Normal homogeneous flow without significant turbulence. LEFT VERTEBRAL ARTERY:  Antegrade flow direction is demonstrated. IMPRESSION: No evidence of hemodynamically significant stenosis in either right or the left internal carotid artery. Nonocclusive calcific plaque formation in the left carotid bulb. Electronically Signed   By: Burman Nieves M.D.   On: 02/27/2016 06:42   Mr Maxine Glenn Head/brain ZO Cm  Result Date: 02/27/2016 CLINICAL DATA:  Stroke EXAM:  MRI HEAD WITHOUT CONTRAST MRA HEAD WITHOUT CONTRAST TECHNIQUE: Multiplanar, multiecho pulse sequences of the brain and surrounding structures were obtained without intravenous contrast. Angiographic images of the head were obtained using MRA technique without contrast. COMPARISON:  CT 02/26/2016 FINDINGS: MRI HEAD FINDINGS Brain: Acute infarct left corona radiata extending into the putamen. Small area of acute infarct body of the caudate on the left. No other areas of acute infarct. Mild generalized atrophy. Moderate chronic microvascular ischemic changes in the white matter and pons. Negative for hemorrhage or mass. No hydrocephalus or shift of the midline structures. Skull and upper cervical spine: Negative Sinuses/Orbits: Mild mucosal edema in the paranasal sinuses. Bilateral lens replacement. Other: None MRA HEAD FINDINGS Distal right vertebral artery patent to the basilar. Left vertebral artery non dominant and patent to the basilar. Basilar widely patent. Right PICA patent. Left PICA not visualized. AICA, superior cerebellar arteries patent bilaterally. Fetal origin of the posterior cerebral artery bilaterally with hypoplastic distal basilar. Internal carotid artery patent bilaterally without significant stenosis. Hypoplastic left A1 segment. Both anterior cerebral arteries supplied from the right. Middle cerebral arteries patent bilaterally. Negative  for cerebral aneurysm. IMPRESSION: Acute infarct in the deep white matter on the left extending into the caudate and putamen. Atrophy with moderate chronic microvascular ischemia No significant intracranial stenosis.  No large vessel occlusion. Electronically Signed   By: Marlan Palau M.D.   On: 02/27/2016 11:01    Assessment: 83 y.o. female with a history of HTN who presents with right sided weakness.  Patient outside window for tPA.  MRI of the brain reviewed and shows an acute badal ganglia infarct.  Likely secondary to small vessel disease.  Carotid  dopplers show no evidence of hemodynamically significant stenosis.  Echocardiogram pending.  A1c pending, LDL 127.  Patient on no antiplatelet therapy.    Stroke Risk Factors - hypertension  Plan: 1. PT consult, OT consult, Speech consult 2. Echocardiogram 3. Carotid dopplers 4. Prophylactic therapy-Antiplatelet med: Aspirin - dose 325mg  daily 5. NPO until RN stroke swallow screen 6. Telemetry monitoring 7. Frequent neuro checks   Thana Farr, MD Neurology [phone] 02/27/2016, 1:07 PM

## 2016-02-27 NOTE — Progress Notes (Signed)
Patient moved to room 103 by bed with Annice PihJackie, NT.  Patient A&Ox4.  PT worked with patient prior to transfer from ICU and did well.  Sons went to new room.

## 2016-02-27 NOTE — Progress Notes (Signed)
Family Meeting Note  Advance Directive:yes  Today a meeting took place with the Patient and son.  The following clinical team members were present during this meeting:MD  The following were discussed:Patient's diagnosis: , Patient's progosis: < 12 months and Goals for treatment: DNR  Additional follow-up to be provided: Palliative care eval as an outpt/facility  Time spent during discussion:20 minutes  Delfino LovettVipul Brenda Cowher, MD

## 2016-02-27 NOTE — ED Notes (Signed)
Pt assisted to bedside toilet. Pt very unsteady with ambulation. Pt daughter states at home pt is able to ambulate without assistance of a person or walker/cane. When attempting to remove her pants pt lost her balance and had to be steadied by this nurse. Fall precautions initiated. Arm band in place

## 2016-02-27 NOTE — Progress Notes (Signed)
*  PRELIMINARY RESULTS* Echocardiogram 2D Echocardiogram has been performed.  Cristela BlueHege, Benito Lemmerman 02/27/2016, 2:42 PM

## 2016-02-27 NOTE — Progress Notes (Signed)
[employer] [facility] - [place] [facility]   PATIENT NAME: Ileanna Gemmill    MR#:  [id number]  DATE OF BIRTH:  Mar 26, 1924  SUBJECTIVE:  CHIEF COMPLAINT:   Chief Complaint  Patient presents with  . Weakness  still has some speech difficulty and rt sided weakness REVIEW OF SYSTEMS:  Review of Systems  Constitutional: Negative for chills, fever and weight loss.  HENT: Negative for nosebleeds and sore throat.   Eyes: Negative for blurred vision.  Respiratory: Negative for cough, shortness of breath and wheezing.   Cardiovascular: Negative for chest pain, orthopnea, leg swelling and PND.  Gastrointestinal: Negative for abdominal pain, constipation, diarrhea, heartburn, nausea and vomiting.  Genitourinary: Negative for dysuria and urgency.  Musculoskeletal: Negative for back pain.  Skin: Negative for rash.  Neurological: Positive for speech change and focal weakness. Negative for dizziness and headaches.  Endo/Heme/Allergies: Does not bruise/bleed easily.  Psychiatric/Behavioral: Negative for depression.    DRUG ALLERGIES:  No Known Allergies VITALS:  Blood pressure (!) 170/67, pulse 93, temperature 97.8 F (36.6 C), temperature source Oral, resp. rate 20, height 5\' 7"  (1.702 m), weight 66.5 kg (146 lb 9.7 oz), SpO2 97 %. PHYSICAL EXAMINATION:  Physical Exam  Constitutional: She is oriented to person, place, and time and well-developed, well-nourished, and in no distress.  HENT:  Head: Normocephalic and atraumatic.  Eyes: Conjunctivae and EOM are normal. Pupils are equal, round, and reactive to light.  Neck: Normal range of motion. Neck supple. No tracheal deviation present. No thyromegaly present.  Cardiovascular: Normal rate, regular rhythm and normal heart sounds.   Pulmonary/Chest: Effort normal and breath sounds normal. No respiratory distress. She has no wheezes. She exhibits no tenderness.  Abdominal: Soft. Bowel sounds are normal. She exhibits no  distension. There is no tenderness.  Musculoskeletal: Normal range of motion.  Neurological: She is alert and oriented to person, place, and time. She displays weakness (rt > lt) and abnormal speech. No cranial nerve deficit.  Skin: Skin is warm and dry. No rash noted.  Psychiatric: Mood and affect normal.   LABORATORY PANEL:   CBC  Recent Labs Lab 02/27/16 0341  WBC 10.3  HGB 14.5  HCT 42.5  PLT 203   ------------------------------------------------------------------------------------------------------------------ Chemistries   Recent Labs Lab 02/26/16 1610 02/27/16 0341  NA 137  --   K 4.4  --   CL 103  --   CO2 25  --   GLUCOSE 134*  --   BUN 19  --   CREATININE 0.95 1.04*  CALCIUM 9.8  --    RADIOLOGY:  Dg Chest 2 View  Result Date: 02/26/2016 CLINICAL DATA:  83 y/o  F; dizziness and weakness. EXAM: CHEST  2 VIEW COMPARISON:  07/10/2013 chest radiograph FINDINGS: Stable cardiac silhouette within normal limits given projection and technique. Aortic atherosclerosis with calcification. No focal consolidation of the lungs. Mild biapical pleuroparenchymal scarring. Multilevel degenerative changes of the thoracic spine. Expanded lungs with flattened diaphragms compatible with COPD. IMPRESSION: No active cardiopulmonary disease. Aortic atherosclerosis. Findings of COPD. Electronically Signed   By: Mitzi Hansen M.D.   On: 02/26/2016 20:58   Ct Head Wo Contrast  Result Date: 02/26/2016 CLINICAL DATA:  Acute onset of generalized weakness and slurred speech. Dizziness. Initial encounter. EXAM: CT HEAD WITHOUT CONTRAST TECHNIQUE: Contiguous axial images were obtained from the base of the skull through the vertex without intravenous contrast. COMPARISON:  CT of the head performed 10/25/2014 FINDINGS: Brain: No evidence of acute infarction, hemorrhage, hydrocephalus,  extra-axial collection or mass lesion/mass effect. Prominence of the ventricles and sulci reflects mild to  moderate cortical volume loss. Scattered periventricular and subcortical white matter change likely reflects small vessel ischemic microangiopathy. Cerebellar atrophy is noted. The brainstem and fourth ventricle are within normal limits. The basal ganglia are unremarkable in appearance. The cerebral hemispheres demonstrate grossly normal gray-white differentiation. No mass effect or midline shift is seen. Vascular: No hyperdense vessel or unexpected calcification. Skull: There is no evidence of fracture; visualized osseous structures are unremarkable in appearance. Sinuses/Orbits: The orbits are within normal limits. The paranasal sinuses and mastoid air cells are well-aerated. Other: No significant soft tissue abnormalities are seen. IMPRESSION: 1. No acute intracranial pathology seen on CT. 2. Mild to moderate cortical volume loss and scattered small vessel ischemic microangiopathy. Electronically Signed   By: Roanna Raider M.D.   On: 02/26/2016 20:49   Mr Brain Wo Contrast  Result Date: 02/27/2016 CLINICAL DATA:  Stroke EXAM: MRI HEAD WITHOUT CONTRAST MRA HEAD WITHOUT CONTRAST TECHNIQUE: Multiplanar, multiecho pulse sequences of the brain and surrounding structures were obtained without intravenous contrast. Angiographic images of the head were obtained using MRA technique without contrast. COMPARISON:  CT 02/26/2016 FINDINGS: MRI HEAD FINDINGS Brain: Acute infarct left corona radiata extending into the putamen. Small area of acute infarct body of the caudate on the left. No other areas of acute infarct. Mild generalized atrophy. Moderate chronic microvascular ischemic changes in the white matter and pons. Negative for hemorrhage or mass. No hydrocephalus or shift of the midline structures. Skull and upper cervical spine: Negative Sinuses/Orbits: Mild mucosal edema in the paranasal sinuses. Bilateral lens replacement. Other: None MRA HEAD FINDINGS Distal right vertebral artery patent to the basilar. Left  vertebral artery non dominant and patent to the basilar. Basilar widely patent. Right PICA patent. Left PICA not visualized. AICA, superior cerebellar arteries patent bilaterally. Fetal origin of the posterior cerebral artery bilaterally with hypoplastic distal basilar. Internal carotid artery patent bilaterally without significant stenosis. Hypoplastic left A1 segment. Both anterior cerebral arteries supplied from the right. Middle cerebral arteries patent bilaterally. Negative for cerebral aneurysm. IMPRESSION: Acute infarct in the deep white matter on the left extending into the caudate and putamen. Atrophy with moderate chronic microvascular ischemia No significant intracranial stenosis.  No large vessel occlusion. Electronically Signed   By: Marlan Palau M.D.   On: 02/27/2016 11:01   US Carotid Bilateral (at [facility] And [facility] Only)  Result Date: 02/27/2016 CLINICAL DATA:  Stroke. History of hypertension, syncope, visual disturbance, hyperlipidemia. EXAM: BILATERAL CAROTID DUPLEX ULTRASOUND TECHNIQUE: Wallace Cullens scale imaging, color Doppler and duplex ultrasound were performed of bilateral carotid and vertebral arteries in the neck. COMPARISON:  None. FINDINGS: Criteria: Quantification of carotid stenosis is based on velocity parameters that correlate the residual internal carotid diameter with NASCET-based stenosis levels, using the diameter of the distal internal carotid lumen as the denominator for stenosis measurement. The following velocity measurements were obtained: RIGHT ICA:  126/19 cm/sec CCA:  77/12 cm/sec SYSTOLIC ICA/CCA RATIO:  1.6 DIASTOLIC ICA/CCA RATIO:  1.6 ECA:  93 cm/sec LEFT ICA:  109/24 cm/sec CCA:  94/14 cm/sec SYSTOLIC ICA/CCA RATIO:  1.2 DIASTOLIC ICA/CCA RATIO:  1.6 ECA:  152 cm/sec RIGHT CAROTID ARTERY: Minimal atherosclerotic change in the carotid bulb without significant stenosis identified. Normal homogeneous flow without significant turbulence. RIGHT VERTEBRAL ARTERY:  Antegrade flow  direction is demonstrated. LEFT CAROTID ARTERY: Mild areas of calcific atherosclerotic change in the carotid bulb. No significant stenosis identified. Normal homogeneous flow  without significant turbulence. LEFT VERTEBRAL ARTERY:  Antegrade flow direction is demonstrated. IMPRESSION: No evidence of hemodynamically significant stenosis in either right or the left internal carotid artery. Nonocclusive calcific plaque formation in the left carotid bulb. Electronically Signed   By: Burman NievesWilliam  Stevens M.D.   On: 02/27/2016 06:42   Mr Maxine GlennMra Head/brain ZOWo Cm  Result Date: 02/27/2016 CLINICAL DATA:  Stroke EXAM: MRI HEAD WITHOUT CONTRAST MRA HEAD WITHOUT CONTRAST TECHNIQUE: Multiplanar, multiecho pulse sequences of the brain and surrounding structures were obtained without intravenous contrast. Angiographic images of the head were obtained using MRA technique without contrast. COMPARISON:  CT 02/26/2016 FINDINGS: MRI HEAD FINDINGS Brain: Acute infarct left corona radiata extending into the putamen. Small area of acute infarct body of the caudate on the left. No other areas of acute infarct. Mild generalized atrophy. Moderate chronic microvascular ischemic changes in the white matter and pons. Negative for hemorrhage or mass. No hydrocephalus or shift of the midline structures. Skull and upper cervical spine: Negative Sinuses/Orbits: Mild mucosal edema in the paranasal sinuses. Bilateral lens replacement. Other: None MRA HEAD FINDINGS Distal right vertebral artery patent to the basilar. Left vertebral artery non dominant and patent to the basilar. Basilar widely patent. Right PICA patent. Left PICA not visualized. AICA, superior cerebellar arteries patent bilaterally. Fetal origin of the posterior cerebral artery bilaterally with hypoplastic distal basilar. Internal carotid artery patent bilaterally without significant stenosis. Hypoplastic left A1 segment. Both anterior cerebral arteries supplied from the right. Middle  cerebral arteries patent bilaterally. Negative for cerebral aneurysm. IMPRESSION: Acute infarct in the deep white matter on the left extending into the caudate and putamen. Atrophy with moderate chronic microvascular ischemia No significant intracranial stenosis.  No large vessel occlusion. Electronically Signed   By: Marlan Palauharles  Clark M.D.   On: 02/27/2016 11:01   ASSESSMENT AND PLAN:  This is a 83 y.o. female with a history of hypertension, vertigo, hyperlipidemia, GERD, osteoporosis, overactive bladder, hypothyroidism now being admitted with:  1. Acute CVA - Patient has persistent right facial droop and right upper extremity weakness for greater than 24 hours. - CT brain was negative for any acute infarct. - MRI of the brain shows an acute badal ganglia infarct.   - Carotid dopplers show no evidence of hemodynamically significant stenosis.  Echocardiogram pending. - ASA 325 mg - PT,OT,ST eval  2. Hypertension-  Resume Norvasc, lisinopril and metoprolol now, initially held to let BP autoregulate BP in acute CVA phase 3. Hypothyroidism-continue Synthroid 4. GERD-continue Prilosec 5. Hyperlipdemia: statin 6. History of osteoporosis-continue vitamin D.     All the records are reviewed and case discussed with Care Management/Social Worker. Management plans discussed with the patient, family and they are in agreement.  CODE STATUS: DNR  TOTAL TIME TAKING CARE OF THIS PATIENT: 35 minutes.   More than 50% of the time was spent in counseling/coordination of care: YES  POSSIBLE D/C IN 1-2 DAYS, DEPENDING ON CLINICAL CONDITION. AND Neuro work up   Delfino LovettVipul Amberlie Gaillard M.D on 02/27/2016 at 3:34 PM  Between 7am to 6pm - Pager - [phone]  After 6pm go to [email or web address] - Social research officer, governmentpassword [email or web address] [facility]  [facility] [place] [facility]  Office  [phone]  CC: Primary care physician; Dorothey BasemanAVID BRONSTEIN, MD  Note: This dictation was prepared with Dragon dictation along with smaller phrase technology.  Any transcriptional errors that result from this process are unintentional.

## 2016-02-27 NOTE — Progress Notes (Signed)
Report called to Mary, RN on 1C.  

## 2016-02-27 NOTE — H&P (Signed)
History and Physical   [facility] - [place] [facility] Admission History and Physical [employer] Deem Marmol, D.O.    Patient Name: Christina Bradshaw MR#: [id number] Date of Birth: 1924/08/18 Date of Admission: 02/26/2016  Referring MD/NP/PA: Dr. Pershing ProudSchaevitz Primary Care Physician: Dorothey BasemanAVID BRONSTEIN, MD Patient coming from: Home, lives independently  Chief Complaint: Weakness  HPI: Christina Bradshaw is a 83 y.o. female with a known history of hypertension, vertigo, hyperlipidemia, GERD, osteoporosis, overactive bladder, hypothyroidism was last seen normal when she spoke to her daughter about 24 hours prior to arrival. Patient slept through the night, but in the morning she received a call from her daughter-in-law who commented that her speech was weak and that the patient did not sound like herself. The patient stated that she felt dizzy so she took some meclizine and took a nap. Patient's son then called later in the day and found her to still sound unlike herself so he went to her home, found her to be weak with speech disturbance and a right sided facial droop.   Here in the emergency department the patient complains only of generalized weakness. She does recall being dizzy but that has since resolved. She states that despite her speech difficulty she has been ambulating about her home and did not notice any focal weakness, confusion, sensory disturbance. Her daughter noted that she had difficulty writing her name in triage.   Patient has not had any significant cardiac history and has never had a cardiac or stroke workup.  Otherwise there has been no change in status. Patient has been taking medication as prescribed and there has been no recent change in medication or diet.  No recent antibiotics.  There has been no recent illness, hospitalizations, travel or sick contacts.    Patient denies fevers/chills, weakness, chest pain, shortness of breath, N/V/C/D, abdominal pain, dysuria/frequency,  changes in mental status.   ED Course: Patient received aspirin 324  Review of Systems:  CONSTITUTIONAL: No fever/chills, fatigue,  weight gain/loss, headache. Positive weakness and dizziness. EYES: No blurry or double vision. ENT: No tinnitus, postnasal drip, redness or soreness of the oropharynx. RESPIRATORY: No cough, dyspnea, wheeze.  No hemoptysis.  CARDIOVASCULAR: No chest pain, palpitations, syncope, orthopnea. No lower extremity edema.  GASTROINTESTINAL: No nausea, vomiting, abdominal pain, diarrhea, constipation.  No hematemesis, melena or hematochezia. GENITOURINARY: No dysuria, frequency, hematuria. ENDOCRINE: No polyuria or nocturia. No heat or cold intolerance. HEMATOLOGY: No anemia, bruising, bleeding. INTEGUMENTARY: No rashes, ulcers, lesions. MUSCULOSKELETAL: No arthritis, gout, dyspnea. NEUROLOGIC: No numbness, tingling, ataxia, seizure-type activity, positive generalized weakness and speech difficulty PSYCHIATRIC: No anxiety, depression, insomnia.   Past Medical History:  Diagnosis Date  . Hypertension   Hyperlipidemia Hypothyroidism GERD Overactive bladder Osteoporosis Past Surgical History:  Procedure Laterality Date  . ABDOMINAL HYSTERECTOMY       reports that she has never smoked. She does not have any smokeless tobacco history on file. She reports that she does not drink alcohol. Her drug history is not on file.  No Known Allergies  Father had no known medical problems, mother had breast cancer Family history has been reviewed and confirmed with patient.   Prior to Admission medications   Medication Sig Start Date End Date Taking? Authorizing Provider  amLODipine (NORVASC) 5 MG tablet Take 5 mg by mouth daily.   Yes Historical Provider, MD  docusate sodium (COLACE) 100 MG capsule Take 100 mg by mouth at bedtime.   Yes Historical Provider, MD  levothyroxine (SYNTHROID, LEVOTHROID) 75 MCG tablet  Take 75 mcg by mouth daily before breakfast.   Yes  Historical Provider, MD  lisinopril (PRINIVIL,ZESTRIL) 40 MG tablet Take 40 mg by mouth daily.   Yes Historical Provider, MD  metoprolol tartrate (LOPRESSOR) 25 MG tablet Take 25 mg by mouth 2 (two) times daily.   Yes Historical Provider, MD  Multiple Vitamin (MULTIVITAMIN WITH MINERALS) TABS tablet Take 1 tablet by mouth daily.   Yes Historical Provider, MD  omeprazole (PRILOSEC) 20 MG capsule Take 20 mg by mouth daily.   Yes Historical Provider, MD  Vitamin D, Cholecalciferol, 400 UNITS CAPS Take 400 Units by mouth daily.   Yes Historical Provider, MD  Influenza Virus Vaccine Split (FLUZONE IM) Inject 1 Dose into the muscle once.    Historical Provider, MD    Physical Exam: Vitals:   02/26/16 2007 02/26/16 2200 02/26/16 2230 02/26/16 2306  BP: (!) 199/97 (!) 164/81 (!) 197/85   Pulse: (!) 101 81 87   Resp: 17 17 (!) 22   Temp: 97.6 F (36.4 C)   97.5 F (36.4 C)  TempSrc: Oral     SpO2: 99% 97% 98%   Weight:      Height:        GENERAL: 83 y.o.-year-old Pale white female patient, well-developed, well-nourished lying in the bed in no acute distress.  Pleasant and cooperative.  Generally weak HEENT: Head atraumatic, normocephalic. Pupils equal, round, reactive to light and accommodation. No scleral icterus. Extraocular muscles intact. Nares are patent. Oropharynx is clear. Mucus membranes moist. NECK: Supple, full range of motion. No JVD, no bruit heard. No thyroid enlargement, no tenderness, no cervical lymphadenopathy. CHEST: Normal breath sounds bilaterally. No wheezing, rales, rhonchi or crackles. No use of accessory muscles of respiration.  No reproducible chest wall tenderness.  CARDIOVASCULAR: S1, S2 normal. No murmurs, rubs, or gallops. Cap refill <2 seconds. Pulses intact distally.  ABDOMEN: Soft, nondistended, nontender. No rebound, guarding, rigidity. Normoactive bowel sounds present in all four quadrants. No organomegaly or mass. EXTREMITIES: No pedal edema, cyanosis, or  clubbing. No calf tenderness or Homan's sign.  NEUROLOGIC: The patient is alert and oriented x 3. Cranial nerves II through XII are grossly intact with no focal sensorimotor deficit with the exception of right facial droop. Muscle strength 4/5 in right upper extremity and 5/5 in all other extremities. Sensation intact. Gait not checked. There is no limb ataxia Cerebellar signs are intact. Finger to nose is slightly sluggish. Speech is weak. NIH score is 2. SKIN: Warm, dry, and intact without obvious rash, lesion, or ulcer.    Labs on Admission:  CBC:  Recent Labs Lab 02/26/16 1610  WBC 9.4  HGB 15.8  HCT 47.0  MCV 92.6  PLT 226   Basic Metabolic Panel:  Recent Labs Lab 02/26/16 1610  NA 137  K 4.4  CL 103  CO2 25  GLUCOSE 134*  BUN 19  CREATININE 0.95  CALCIUM 9.8   GFR: Estimated Creatinine Clearance: 39 mL/min (by C-G formula based on SCr of 0.95 mg/dL). Liver Function Tests: No results for input(s): AST, ALT, ALKPHOS, BILITOT, PROT, ALBUMIN in the last 168 hours. No results for input(s): LIPASE, AMYLASE in the last 168 hours. No results for input(s): AMMONIA in the last 168 hours. Coagulation Profile: No results for input(s): INR, PROTIME in the last 168 hours. Cardiac Enzymes:  Recent Labs Lab 02/26/16 1610 02/26/16 2015  TROPONINI <0.03 <0.03   BNP (last 3 results) No results for input(s): PROBNP in the last 8760 hours.  HbA1C: No results for input(s): HGBA1C in the last 72 hours. CBG: No results for input(s): GLUCAP in the last 168 hours. Lipid Profile: No results for input(s): CHOL, HDL, LDLCALC, TRIG, CHOLHDL, LDLDIRECT in the last 72 hours. Thyroid Function Tests: No results for input(s): TSH, T4TOTAL, FREET4, T3FREE, THYROIDAB in the last 72 hours. Anemia Panel: No results for input(s): VITAMINB12, FOLATE, FERRITIN, TIBC, IRON, RETICCTPCT in the last 72 hours. Urine analysis:    Component Value Date/Time   COLORURINE YELLOW (A) 02/26/2016 1610    APPEARANCEUR CLEAR (A) 02/26/2016 1610   LABSPEC 1.009 02/26/2016 1610   PHURINE 7.0 02/26/2016 1610   GLUCOSEU NEGATIVE 02/26/2016 1610   HGBUR NEGATIVE 02/26/2016 1610   BILIRUBINUR NEGATIVE 02/26/2016 1610   KETONESUR NEGATIVE 02/26/2016 1610   PROTEINUR 100 (A) 02/26/2016 1610   NITRITE NEGATIVE 02/26/2016 1610   LEUKOCYTESUR NEGATIVE 02/26/2016 1610   Sepsis Labs: @LABRCNTIP (procalcitonin:4,lacticidven:4) )No results found for this or any previous visit (from the past 240 hour(s)).   Radiological Exams on Admission: Dg Chest 2 View  Result Date: 02/26/2016 CLINICAL DATA:  83 y/o  F; dizziness and weakness. EXAM: CHEST  2 VIEW COMPARISON:  07/10/2013 chest radiograph FINDINGS: Stable cardiac silhouette within normal limits given projection and technique. Aortic atherosclerosis with calcification. No focal consolidation of the lungs. Mild biapical pleuroparenchymal scarring. Multilevel degenerative changes of the thoracic spine. Expanded lungs with flattened diaphragms compatible with COPD. IMPRESSION: No active cardiopulmonary disease. Aortic atherosclerosis. Findings of COPD. Electronically Signed   By: Mitzi Hansen M.D.   On: 02/26/2016 20:58   Ct Head Wo Contrast  Result Date: 02/26/2016 CLINICAL DATA:  Acute onset of generalized weakness and slurred speech. Dizziness. Initial encounter. EXAM: CT HEAD WITHOUT CONTRAST TECHNIQUE: Contiguous axial images were obtained from the base of the skull through the vertex without intravenous contrast. COMPARISON:  CT of the head performed 10/25/2014 FINDINGS: Brain: No evidence of acute infarction, hemorrhage, hydrocephalus, extra-axial collection or mass lesion/mass effect. Prominence of the ventricles and sulci reflects mild to moderate cortical volume loss. Scattered periventricular and subcortical white matter change likely reflects small vessel ischemic microangiopathy. Cerebellar atrophy is noted. The brainstem and fourth  ventricle are within normal limits. The basal ganglia are unremarkable in appearance. The cerebral hemispheres demonstrate grossly normal gray-white differentiation. No mass effect or midline shift is seen. Vascular: No hyperdense vessel or unexpected calcification. Skull: There is no evidence of fracture; visualized osseous structures are unremarkable in appearance. Sinuses/Orbits: The orbits are within normal limits. The paranasal sinuses and mastoid air cells are well-aerated. Other: No significant soft tissue abnormalities are seen. IMPRESSION: 1. No acute intracranial pathology seen on CT. 2. Mild to moderate cortical volume loss and scattered small vessel ischemic microangiopathy. Electronically Signed   By: Roanna Raider M.D.   On: 02/26/2016 20:49    EKG: Normal sinus rhythm at 92 bpm with normal axis. There is a left anterior hemiblock, T-wave inversions in aVL and V2 and nonspecific ST and T wave changes.  Assessment/Plan Active Problems:   TIA (transient ischemic attack)    This is a 83 y.o. female with a history of hypertension, vertigo, hyperlipidemia, GERD, osteoporosis, overactive bladder, hypothyroidism now being admitted with: 1.  CVA - patient has persistent right facial droop and right upper extremity weakness for greater than 24 hours. CT brain was negative for any acute infarct. - Admit telemetry inpatient for neuro workup including: - Studies: MRA/MRI, Echo, Carotids - Labs: CBC, BMP, Lipids, TFTs, A1C - Nursing: Neurochecks,  O2, dysphagia screen, permissive hypertension.  - Consults: Neurology, PT/OT, S/S consults.  - Meds: Daily aspirin 81mg .   - Fluids: IVNS@75cc /hr.   - Routine DVT Px: with Lovenox, SCDs, early ambulation  2. Hypertension-hold antihypertensives at this time (Norvasc, lisinopril and metoprolol) 3. Hypothyroidism-continue Synthroid 4. GERD-continue Prilosec 5. Hyperlipdemia, not currently medicated 6. History of osteoporosis-continue vitamin  D.  Admission status: Observation, telemetry IV Fluids: NS Diet/Nutrition: NPO Consults called: Neuro, PT  DVT Px: Lovenox, SCDs and early ambulation. Code Status: Patient expresses that she would like to be a DO NOT RESUSCITATE. Her daughter is her healthcare proxy and will be with her overnight in the hospital. Disposition Plan: Likely to significant bradycardia  All the records are reviewed and case discussed with ED provider. Management plans discussed with the patient and/or family who express understanding and agree with plan of care.  Keller Mikels D.O. on 02/27/2016 at 12:11 AM Between 7am to 6pm - Pager - [phone] After 6pm go to [email or web address] - Social research officer, government [facility] [place] [facility] Office [phone] CC: Primary care physician; Dorothey Baseman, MD   02/27/2016, 12:11 AM

## 2016-02-27 NOTE — ED Notes (Signed)
Patient transported to MRI 

## 2016-02-27 NOTE — ED Notes (Signed)
Spoke with MD due to pts BP increase and change in mental/neuro status. MD recommendations include: changing to step down unit, close monitor on neuro status and BP.

## 2016-02-27 NOTE — Plan of Care (Signed)
Problem: SLP Dysphagia Goals Goal: Misc Dysphagia Goal Pt will safely tolerate po diet of least restrictive consistency w/ no overt s/s of aspiration noted by Staff/pt/family x3 sessions.    

## 2016-02-27 NOTE — ED Notes (Signed)
Lab notified to do pt daily blood draw.

## 2016-02-28 ENCOUNTER — Inpatient Hospital Stay: Payer: Medicare Other

## 2016-02-28 DIAGNOSIS — I63312 Cerebral infarction due to thrombosis of left middle cerebral artery: Secondary | ICD-10-CM

## 2016-02-28 LAB — TROPONIN I
TROPONIN I: 0.06 ng/mL — AB (ref <0.03)
TROPONIN I: 0.06 ng/mL — AB (ref <0.03)
TROPONIN I: 0.06 ng/mL — AB (ref <0.03)
Troponin I: 0.06 ng/mL (ref <0.03)
Troponin I: 0.05 ng/mL (ref <0.03)

## 2016-02-28 LAB — HEMOGLOBIN A1C
Hgb A1c MFr Bld: 6 % — ABNORMAL HIGH (ref 4.8–5.6)
MEAN PLASMA GLUCOSE: 126 mg/dL

## 2016-02-28 MED ORDER — LISINOPRIL 20 MG PO TABS
40.0000 mg | ORAL_TABLET | Freq: Every day | ORAL | Status: DC
Start: 2016-02-28 — End: 2016-02-29
  Administered 2016-02-28 – 2016-02-29 (×2): 40 mg via ORAL
  Filled 2016-02-28 (×2): qty 2

## 2016-02-28 MED ORDER — AMLODIPINE BESYLATE 5 MG PO TABS
5.0000 mg | ORAL_TABLET | Freq: Every day | ORAL | Status: DC
  Administered 2016-02-28 – 2016-02-29 (×2): 5 mg via ORAL
  Filled 2016-02-28 (×2): qty 1

## 2016-02-28 MED ORDER — METOPROLOL TARTRATE 25 MG PO TABS
25.0000 mg | ORAL_TABLET | Freq: Two times a day (BID) | ORAL | Status: DC
  Administered 2016-02-28 – 2016-02-29 (×3): 25 mg via ORAL
  Filled 2016-02-28 (×4): qty 1

## 2016-02-28 MED ORDER — SIMETHICONE 40 MG/0.6ML PO SUSP
40.0000 mg | Freq: Four times a day (QID) | ORAL | Status: DC | PRN
  Administered 2016-02-28: 40 mg via ORAL
  Filled 2016-02-28 (×2): qty 0.6

## 2016-02-28 NOTE — Care Management (Signed)
Admitted to [facility] with the diagnosis of stroke. Lives alone. Daughter is Vivianne SpenceKathy Dotson [phone]). Last seen Dr, Terance HartBronstein 08/19/16. No home health in the past. No skilled facility in the past. No home oxygen. Does have Life Alert in the home. No falls. Eats small meals. Takes care of all basic activities of daily living herself, doesn't drive. No equipment in the home. Physical therapy evaluation completed. Recommending Inpatient Acute Rehab. Explain to Ms. Conard NovakHinshaw and daughter that Inpatient [facility]. The PT/OT would be 3 hours a day. Daughter said they would arrange follow-up care in the home if needed after discharging from Inpatient Acute Rehab. All family members are in agreement with this plan. Received text message from Roderic PalauGenie Logue, RN representative for Inpatient Acute Rehab. There are beds available. Will review case. Gwenette GreetBrenda S Joscelynn Brutus RN MSN CCM Care Management

## 2016-02-28 NOTE — Progress Notes (Addendum)
[facility] - [place] at [facility]   PATIENT NAME: Christina Bradshaw    MR#:  [id number]  DATE OF BIRTH:  17-Jul-1924  SUBJECTIVE:  CHIEF COMPLAINT:   Chief Complaint  Patient presents with  . Weakness  continues to have speech difficulty and rt sided weakness, troponins up REVIEW OF SYSTEMS:  Review of Systems  Constitutional: Negative for chills, fever and weight loss.  HENT: Negative for nosebleeds and sore throat.   Eyes: Negative for blurred vision.  Respiratory: Negative for cough, shortness of breath and wheezing.   Cardiovascular: Negative for chest pain, orthopnea, leg swelling and PND.  Gastrointestinal: Negative for abdominal pain, constipation, diarrhea, heartburn, nausea and vomiting.  Genitourinary: Negative for dysuria and urgency.  Musculoskeletal: Negative for back pain.  Skin: Negative for rash.  Neurological: Positive for speech change and focal weakness. Negative for dizziness and headaches.  Endo/Heme/Allergies: Does not bruise/bleed easily.  Psychiatric/Behavioral: Negative for depression.    DRUG ALLERGIES:  No Known Allergies VITALS:  Blood pressure (!) 174/67, pulse 96, temperature 97.8 F (36.6 C), temperature source Oral, resp. rate 20, height 5\' 7"  (1.702 m), weight 66.5 kg (146 lb 9.7 oz), SpO2 96 %. PHYSICAL EXAMINATION:  Physical Exam  Constitutional: She is oriented to person, place, and time and well-developed, well-nourished, and in no distress.  HENT:  Head: Normocephalic and atraumatic.  Eyes: Conjunctivae and EOM are normal. Pupils are equal, round, and reactive to light.  Neck: Normal range of motion. Neck supple. No tracheal deviation present. No thyromegaly present.  Cardiovascular: Normal rate, regular rhythm and normal heart sounds.   Pulmonary/Chest: Effort normal and breath sounds normal. No respiratory distress. She has no wheezes. She exhibits no tenderness.  Abdominal: Soft. Bowel sounds are normal. She  exhibits no distension. There is no tenderness.  Musculoskeletal: Normal range of motion.  Neurological: She is alert and oriented to person, place, and time. She displays weakness (rt > lt) and abnormal speech. No cranial nerve deficit.  Skin: Skin is warm and dry. No rash noted.  Psychiatric: Mood and affect normal.   LABORATORY PANEL:   CBC  Recent Labs Lab 02/27/16 0341  WBC 10.3  HGB 14.5  HCT 42.5  PLT 203   ------------------------------------------------------------------------------------------------------------------ Chemistries   Recent Labs Lab 02/26/16 1610 02/27/16 0341  NA 137  --   K 4.4  --   CL 103  --   CO2 25  --   GLUCOSE 134*  --   BUN 19  --   CREATININE 0.95 1.04*  CALCIUM 9.8  --    RADIOLOGY:  Mr Brain Wo Contrast  Result Date: 02/27/2016 CLINICAL DATA:  Stroke EXAM: MRI HEAD WITHOUT CONTRAST MRA HEAD WITHOUT CONTRAST TECHNIQUE: Multiplanar, multiecho pulse sequences of the brain and surrounding structures were obtained without intravenous contrast. Angiographic images of the head were obtained using MRA technique without contrast. COMPARISON:  CT 02/26/2016 FINDINGS: MRI HEAD FINDINGS Brain: Acute infarct left corona radiata extending into the putamen. Small area of acute infarct body of the caudate on the left. No other areas of acute infarct. Mild generalized atrophy. Moderate chronic microvascular ischemic changes in the white matter and pons. Negative for hemorrhage or mass. No hydrocephalus or shift of the midline structures. Skull and upper cervical spine: Negative Sinuses/Orbits: Mild mucosal edema in the paranasal sinuses. Bilateral lens replacement. Other: None MRA HEAD FINDINGS Distal right vertebral artery patent to the basilar. Left vertebral artery non dominant and patent to the basilar. Basilar widely  patent. Right PICA patent. Left PICA not visualized. AICA, superior cerebellar arteries patent bilaterally. Fetal origin of the posterior  cerebral artery bilaterally with hypoplastic distal basilar. Internal carotid artery patent bilaterally without significant stenosis. Hypoplastic left A1 segment. Both anterior cerebral arteries supplied from the right. Middle cerebral arteries patent bilaterally. Negative for cerebral aneurysm. IMPRESSION: Acute infarct in the deep white matter on the left extending into the caudate and putamen. Atrophy with moderate chronic microvascular ischemia No significant intracranial stenosis.  No large vessel occlusion. Electronically Signed   By: Marlan Palau M.D.   On: 02/27/2016 11:01   Mr Maxine Glenn Head/brain ZO Cm  Result Date: 02/27/2016 CLINICAL DATA:  Stroke EXAM: MRI HEAD WITHOUT CONTRAST MRA HEAD WITHOUT CONTRAST TECHNIQUE: Multiplanar, multiecho pulse sequences of the brain and surrounding structures were obtained without intravenous contrast. Angiographic images of the head were obtained using MRA technique without contrast. COMPARISON:  CT 02/26/2016 FINDINGS: MRI HEAD FINDINGS Brain: Acute infarct left corona radiata extending into the putamen. Small area of acute infarct body of the caudate on the left. No other areas of acute infarct. Mild generalized atrophy. Moderate chronic microvascular ischemic changes in the white matter and pons. Negative for hemorrhage or mass. No hydrocephalus or shift of the midline structures. Skull and upper cervical spine: Negative Sinuses/Orbits: Mild mucosal edema in the paranasal sinuses. Bilateral lens replacement. Other: None MRA HEAD FINDINGS Distal right vertebral artery patent to the basilar. Left vertebral artery non dominant and patent to the basilar. Basilar widely patent. Right PICA patent. Left PICA not visualized. AICA, superior cerebellar arteries patent bilaterally. Fetal origin of the posterior cerebral artery bilaterally with hypoplastic distal basilar. Internal carotid artery patent bilaterally without significant stenosis. Hypoplastic left A1 segment. Both  anterior cerebral arteries supplied from the right. Middle cerebral arteries patent bilaterally. Negative for cerebral aneurysm. IMPRESSION: Acute infarct in the deep white matter on the left extending into the caudate and putamen. Atrophy with moderate chronic microvascular ischemia No significant intracranial stenosis.  No large vessel occlusion. Electronically Signed   By: Marlan Palau M.D.   On: 02/27/2016 11:01   ASSESSMENT AND PLAN:  This is a 83 y.o. female with a history of hypertension, vertigo, hyperlipidemia, GERD, osteoporosis, overactive bladder, hypothyroidism now being admitted with:  1. Acute CVA - Patient has persistent right facial droop and right upper extremity weakness for greater than 24 hours. - CT brain was negative for any acute infarct. - MRI of the brain shows an acute badal ganglia infarct.   - Carotid dopplers show no evidence of hemodynamically significant stenosis.  Echocardiogram wnl - ASA 325 mg & High intensity Statin - PT,OT,ST - recommends CIR - CM aware and working on it  2. Hypertension-  Resume Norvasc, lisinopril and metoprolol now, initially held to let BP autoregulate BP in acute CVA phase - hydralazine prn for SBP > 180  3. Elevated troponins: likely due to supply demand ischemia, doubt MI, will continue to trend to r/o mi, on asa. 4. GERD-continue Prilosec 5. Hyperlipdemia: High intensity statin 6. History of osteoporosis-continue vitamin D. 7. Hypothyroidism-continue Synthroid     All the records are reviewed and case discussed with Care Management/Social Worker. Management plans discussed with the patient, family and they are in agreement.  CODE STATUS: DNR  TOTAL TIME TAKING CARE OF THIS PATIENT: 35 minutes.   More than 50% of the time was spent in counseling/coordination of care: YES  POSSIBLE D/C IN 1-2 DAYS, DEPENDING ON CLINICAL CONDITION. AND Placement  Delfino LovettVipul Tanaiya Kolarik M.D on 02/28/2016 at 10:05 AM  Between 7am to 6pm - Pager -  [phone]  After 6pm go to [email or web address] - Social research officer, governmentpassword [facility] [facility]  [facility] [place] [facility]  Office  [phone]  CC: Primary care physician; Dorothey BasemanAVID BRONSTEIN, MD  Note: This dictation was prepared with Dragon dictation along with smaller phrase technology. Any transcriptional errors that result from this process are unintentional.

## 2016-02-28 NOTE — PMR Pre-admission (Signed)
[facility] PMR Admission Coordinator Pre-Admission Assessment  Patient: Christina Bradshaw is an 83 y.o., female MRN: [id number] DOB: [birth year] Height: 5\' 7"  (170.2 cm) Weight: 66.5 kg (146 lb 9.7 oz)  Insurance Information HMO: No   PPO:       PCP:       IPA:       80/20:       OTHER:   PRIMARY:  Medicare A/B      Policy#:  [id number]      Subscriber: Richardine Service CM Name:        Phone#:       Fax#:   Pre-Cert#:        Employer: Retired  Benefits:  Phone #:       Name: Checked in  [place]. Date: 12/19/91     Deduct: $1340      Out of Pocket Max: none      Life Max: unlimited CIR: 100%      SNF: 100 days Outpatient: 80%     Co-Pay: 20% Home Health: 100%      Co-Pay: none DME: 80%     Co-Pay: 20% Providers: patient's choice  SECONDARY: [employer]      Policy#: [id number]      Subscriber: Richardine Service CM Name:        Phone#:       Fax#:   Pre-Cert#:        Employer:   Benefits:  Phone #: [phone]     Name:   Eff. Date:       Deduct:        Out of Pocket Max:        Life Max:   CIR:        SNF:   Outpatient:       Co-Pay:   Home Health:        Co-Pay:   DME:       Co-Pay:    Emergency Contact Information Contact Information    Name Relation Home Work Mobile   [place] H Daughter [phone]     Starkisha, Tullis [phone]     Yomayra, Tate  [phone]        Current Medical History  Patient Admitting Diagnosis: L CVA  History of Present Illness: An 83 y.o.female with history of HTN who was admitted on 02/26/15 with complaints of dizziness and speech changes since awakening from sleep. Her symptoms did not improve and was taken to [facility] ED by family. NIHSS 7 and  MRI brain done revealing acute infarct left corona extending into putamen and left caudate. MRA negative for stenosis/large vessel occlusion. Carotid dopplers with non-occlusive calcific plaque formation in left carotid bulb and no significant ICA stenosis.  2D echo with EF 60-65% with mild LVH and  calcified mitral annulus. She did have worsening of weakness overnight with dense right hemiparesis. Dr. Thad Ranger recommends ASA and statin for stroke due to small vessel disease.   an 83 y.o.female with history of HTN who was admitted on 02/26/15 with compliants of dizziness and speech changes since awakening from sleep. Her symptoms did not improve and was taken to [facility] ED by family. NIHSS 7 and  MRI brain done revealing acute infarct left corona extending into putamen and left caudate. MRA negative for stenosis/large vessel occlusion. Carotid dopplers with non-occlusive calcific plaque formation in left carotid bulb and no significant ICA stenosis.  2D echo with EF 60-65% with mild LVH and calcified mitral annulus. an 83 y.o.female with  history of HTN who was admitted on 02/26/15 with compliants of dizziness and speech changes since awakening from sleep. Her symptoms did not improve and was taken to [facility] ED by family. NIHSS 7 and  MRI brain done revealing acute infarct left corona extending into putamen and left caudate. MRA negative for stenosis/large vessel occlusion. Carotid dopplers with non-occlusive calcific plaque formation in left carotid bulb and no significant ICA stenosis.  2D echo with EF 60-65% with mild LVH and calcified mitral annulus. She did worsening of weakness overnight with dense right hemiparesis. Dr. Thad Ranger recommends ASA and statin for stroke due to small vessel disease.  BSS done revealing signs of aspiration with thin and placed on dysphagia 1, nectar liquids. She did worsening of weakness overnight with dense right hemiparesis. Dr. Thad Ranger recommends ASA and statin for stroke due to small vessel disease.   Patient's medical record from [facility] has been reviewed by the rehabilitation admission coordinator and physician.  NIH Stroke scale: 7  Past Medical History  Past Medical History:  Diagnosis Date  . Hypertension   . Stroke [facility])     Family  History   family history is not on file.  Prior Rehab/Hospitalizations Has the patient had major surgery during 100 days prior to admission? No   Current Medications See MAR from [facility]  Patients Current Diet:  Dys 1, nectar thick liquids  Precautions / Restrictions Precautions Precautions: Fall Precaution Comments: Aspiration; Seizure Restrictions Weight Bearing Restrictions: No   Has the patient had 2 or more falls or a fall with injury in the past year?No  Prior Activity Level Limited Community (1-2x/wk): Went to church on Sundays, to grocery store.  Did not drive herself.  Walked daily to [employer] with her cane.  Prior Functional Level Self Care: Did the patient need help bathing, dressing, using the toilet or eating?  Independent  Indoor Mobility: Did the patient need assistance with walking from room to room (with or without device)? Independent  Stairs: Did the patient need assistance with internal or external stairs (with or without device)? Independent  Functional Cognition: Did the patient need help planning regular tasks such as shopping or remembering to take medications? Independent  Home Assistive Devices / Equipment Home Assistive Devices/Equipment: Cane (specify quad or straight) (straight) Home Equipment: Cane - single point, Cane - quad  Prior Device Use: Indicate devices/aids used by the patient prior to current illness, exacerbation or injury? Straight cane.  Used it to walk to and from the mail box daily.   Prior Functional Level Current Functional Level  Bed Mobility  Independent  Mod assist   Transfers  Independent  Mod assist   Mobility - Walk/Wheelchair  Independent  Mod assist (Amb 3 ft +2 mod assist hemiwalker.)   Upper Body Dressing  Independent  Mod assist   Lower Body Dressing  Independent  Max assist   Grooming  Independent  Other (Set up)   Eating/Drinking  Independent  Min assist   Toilet  Transfer  Independent  Mod assist   Bladder Continence   WDL  Incontinence, using bedpan   Bowel Management  WDL  NO BM since 02/26/16   Stair Climbing  Independent Other (Not tried.)   Communication  Intact  Dysarthria, low volume   Memory  Intact  Confusion   Cooking/Meal Prep  Independent      Housework  Independent    Money Management  Independent    Driving  No, does  not drive.     Special needs/care consideration BiPAP/CPAP No CPM No Continuous Drip IV 0.9% NS at 75 mL/hr Dialysis No       Life Vest No Oxygen No Special Bed No Trach Size No Wound Vac (area) No    Skin Had some facial precancer spots removed in the past                             Bowel mgmt: Last BM 02/26/16 Bladder mgmt: Voiding on bedpan with incontinence at times Diabetic mgmt No  Previous Home Environment Living Arrangements: Alone  Lives With: Family Available Help at Discharge: Family, Available PRN/intermittently Type of Home: House Home Layout: Two level, Able to live on main level with bedroom/bathroom Home Access: Stairs to enter Entrance Stairs-Rails: Left Entrance Stairs-Number of Steps: 5 STE Bathroom Shower/Tub: Engineer, manufacturing systemsTub/shower unit Bathroom Toilet: Standard Bathroom Accessibility: Yes How Accessible: Accessible via walker Home Care Services: No  Discharge Living Setting Plans for Discharge Living Setting: Patient's home, House (Will have help at time of discharge.  Lives alone.) Type of Home at Discharge: House Discharge Home Layout: Two level, Able to live on main level with bedroom/bathroom Alternate Level Stairs-Number of Steps: Flight - does not have to go upstairs Discharge Home Access: Stairs to enter Entrance Stairs-Number of Steps: 5 steps Does the patient have any problems obtaining your medications?: No  Social/Family/Support Systems Patient Roles: Parent (Has 1 dtr and 2 sons.) Contact Information: Family to share caregiver role, hire help if  needed. Anticipated Caregiver: Vivianne SpenceKathy Dotson - dtr Anticipated Caregiver's Contact Information: Burt EkKathy - dtr - [phone]- [phone] Ability/Limitations of Caregiver: Dtr and sons work. Caregiver Availability: Other (Comment) (Family aware of need for 24/7 assistance after rehab.) Discharge Plan Discussed with Primary Caregiver: Yes Is Caregiver In Agreement with Plan?: Yes Does Caregiver/Family have Issues with Lodging/Transportation while Pt is in Rehab?: No  Goals/Additional Needs Patient/Family Goal for Rehab: PT/OT min assist, SLP mod I and supervision goals Expected length of stay: 14-18 days [facility] Dietary Needs: Dys 1, nectar thick liquids Equipment Needs: TBD Pt/Family Agrees to Admission and willing to participate: Yes Program Orientation Provided & Reviewed with Pt/Caregiver Including Roles  & Responsibilities: Yes  Patient Condition:  I have reviewed all notes in the medical record and have spoken with patient's daughter.  Patient lived alone and was independent PTA.  Family understands that patient will need supervision post rehab discharge.  Patient will benefit and can tolerate 3 hours of therapy a day.  Family all in agreement to acute inpatient rehab admission [facility].  I have reviewed all progress with rehab MD and I have approval for acute inpatient rehab admission for today.  Preadmission Screen Completed By:  Trish MageLogue, Eugenia M, 02/28/2016 2:40 PM ______________________________________________________________________   Discussed status with Dr. Riley KillSwartz on 02/29/16 at 0919 and received telephone approval for admission today.  Admission Coordinator:  Trish MageLogue, Eugenia M, time 0919/Date 02/29/16   Assessment/Plan:  Diagnosis: left subcortical infarct 1. Does the need for close, 24 hr/day  Medical supervision in concert with the patient's rehab needs make it unreasonable for this patient to be served in a less intensive setting?  Yes 2. Co-Morbidities requiring supervision/potential complications: htn 3. Due to bladder management, bowel management, safety, skin/wound care, disease management, medication administration, pain management and patient education, does the patient require 24 hr/day rehab nursing? Yes 4. Does the patient require coordinated care of a physician,  rehab nurse, PT (1-2 hrs/day, 5 days/week), OT (1-2 hrs/day, 5 days/week) and SLP (1-2 hrs/day, 5 days/week) to address physical and functional deficits in the context of the above medical diagnosis(es)? Yes Addressing deficits in the following areas: balance, endurance, locomotion, strength, transferring, bowel/bladder control, bathing, dressing, feeding, grooming, toileting, cognition, speech and psychosocial support 5. Can the patient actively participate in an intensive therapy program of at least 3 hrs of therapy 5 days a week? Yes 6. The potential for patient to make measurable gains while on inpatient rehab is excellent 7. Anticipated functional outcomes upon discharge from inpatients are: min assist PT, min assist OT, modified independent and supervision SLP 8. Estimated rehab length of stay to reach the above functional goals is: 14-18 days 9. Does the patient have adequate social supports to accommodate these discharge functional goals? Yes 10. Anticipated D/C setting: Home 11. Anticipated post D/C treatments: HH therapy and Outpatient therapy 12. Overall Rehab/Functional Prognosis: excellent    RECOMMENDATIONS: This patient's condition is appropriate for continued rehabilitative care in the following setting: CIR Patient has agreed to participate in recommended program. Yes Note that insurance prior authorization may be required for reimbursement for recommended care.  Comment:  Pt to be admitted to inpatient rehab today. [name], MD, [facility] Physical Medicine & Rehabilitation 02/29/2016   Trish Mage 02/28/2016

## 2016-02-28 NOTE — H&P (Signed)
Physical Medicine and Rehabilitation Admission H&P    Chief Complaint  Patient presents with  . Right sided weakness, slurred speech and dysphagia    HPI: Christina Bradshaw is an 83 y.o. female with history of HTN, palpitations,  who was admitted on 02/26/15 with compliants of dizziness and speech changes since awakening from sleep. Her symptoms did not improve and was taken to [facility] ED by family. NIHSS 7 and  MRI brain done revealing acute infarct left corona radiata extending into putamen and left caudate. MRA negative for stenosis/large vessel occlusion. Carotid dopplers with non-occlusive calcific plaque formation in left carotid bulb and no significant ICA stenosis.  2D echo with EF 60-65% with mild LVH and calcified mitral annulus. She did worsening of weakness overnight with dense right hemiparesis. Dr. Doy Mince recommends ASA and statin for stroke due to small vessel disease.  Cardiac enzymes being monitored and elevation felt to be due to demand ischemia and doubt MI per reports. BSS done revealing signs of aspiration with thin and placed on dysphagia 1, nectar liquids. Therapy evaluations done revealing decreased balance with posterior lean and RLE instability. CIR recommended for follow up therapy.    Review of Systems  HENT: Negative for hearing loss and tinnitus.   Eyes: Negative for blurred vision and double vision.  Respiratory: Negative for cough and shortness of breath.   Cardiovascular: Positive for palpitations.  Gastrointestinal: Positive for constipation (chronic issues). Negative for heartburn.  Genitourinary: Negative for dysuria and urgency.  Musculoskeletal: Negative for back pain, joint pain and myalgias.  Skin: Negative for itching and rash.  Neurological: Positive for speech change and focal weakness. Negative for dizziness and headaches.  Psychiatric/Behavioral: The patient is nervous/anxious. The patient does not have insomnia.       Past Medical History:    Diagnosis Date  . Hypertension   . Stroke [facility])     Past Surgical History:  Procedure Laterality Date  . ABDOMINAL HYSTERECTOMY      History reviewed. No pertinent family history.    Social History:  Widowed. Lives alone and independent PTA. Does not drive. reports that she has never smoked. She has never used smokeless tobacco. She reports that she does not drink alcohol or use drugs.    Allergies: No Known Allergies    Medications Prior to Admission  Medication Sig Dispense Refill  . amLODipine (NORVASC) 5 MG tablet Take 5 mg by mouth daily.    Marland Kitchen docusate sodium (COLACE) 100 MG capsule Take 100 mg by mouth at bedtime.    Marland Kitchen levothyroxine (SYNTHROID, LEVOTHROID) 75 MCG tablet Take 75 mcg by mouth daily before breakfast.    . lisinopril (PRINIVIL,ZESTRIL) 40 MG tablet Take 40 mg by mouth daily.    . metoprolol tartrate (LOPRESSOR) 25 MG tablet Take 25 mg by mouth 2 (two) times daily.    . Multiple Vitamin (MULTIVITAMIN WITH MINERALS) TABS tablet Take 1 tablet by mouth daily.    Marland Kitchen omeprazole (PRILOSEC) 20 MG capsule Take 20 mg by mouth daily.    . Vitamin D, Cholecalciferol, 400 UNITS CAPS Take 400 Units by mouth daily.    . Influenza Virus Vaccine Split (FLUZONE IM) Inject 1 Dose into the muscle once.      Home: Home Living Family/patient expects to be discharged to:: Private residence Living Arrangements: Alone Available Help at Discharge: Family, Available PRN/intermittently Type of Home: House Home Access: Stairs to enter [employer] of Steps: 5 STE Entrance Stairs-Rails: Left Home Layout: Two  level, Able to live on main level with bedroom/bathroom Bathroom Shower/Tub: Chiropodist: Standard Bathroom Accessibility: Yes Home Equipment: Cane - single point, Cane - quad   Functional History: Prior Function Level of Independence: Independent with assistive device(s) Comments: Pt independent (except does not drive) and occasionally uses  SPC outside on uneven ground.  Pt denies any falls in past 6 months.  Functional Status:  Mobility: Bed Mobility Overal bed mobility: Needs Assistance Bed Mobility: Supine to Sit Supine to sit: Mod assist, HOB elevated Sit to supine: Max assist, HOB elevated General bed mobility comments: assist for R LE and trunk supine to/from sit; vc's for technique required Transfers Overall transfer level: Needs assistance Equipment used: Hemi-walker Transfers: Sit to/from Stand, [employer] Transfers Sit to Stand: Mod assist Stand pivot transfers: Mod assist, +2 physical assistance General transfer comment: able to stand and transfer with HW and RUE supported in sling/belt.  R knee hyperextends during standing/stepping Ambulation/Gait Ambulation/Gait assistance: Mod assist, +2 physical assistance Ambulation Distance (Feet): 3 Feet Assistive device: Hemi-walker Gait Pattern/deviations: Step-to pattern, Trunk flexed General Gait Details: limited ambulation     ADL:    Cognition: Cognition Overall Cognitive Status: Within Functional Limits for tasks assessed Orientation Level: Oriented X4 Cognition Arousal/Alertness: Awake/alert Behavior During Therapy: WFL for tasks assessed/performed Overall Cognitive Status: Within Functional Limits for tasks assessed   Blood pressure (!) 174/67, pulse 96, temperature 97.8 F (36.6 C), temperature source Oral, resp. rate 20, height 5' 7" (1.702 m), weight 66.5 kg (146 lb 9.7 oz), SpO2 96 %. Physical Exam  Nursing note and vitals reviewed. Constitutional: She is oriented to person, place, and time. She appears well-developed and well-nourished.  Fatigued appearing elderly female with soft voice.   HENT:  Head: Normocephalic and atraumatic.  Eyes: Conjunctivae and EOM are normal. Pupils are equal, round, and reactive to light.  Neck: Normal range of motion. Neck supple.  Cardiovascular: Normal rate and regular rhythm.   Murmur heard. Respiratory:  No stridor. She is in respiratory distress. She has no wheezes.  GI: Soft. Bowel sounds are normal. She exhibits no distension. There is no tenderness. There is no rebound.  Musculoskeletal: She exhibits no edema.  Neurological: She is alert and oriented to person, place, and time. A cranial nerve deficit is present.  Right facial weakness with moderate to severe dysarthria and tongue with deviation to right. Soft voice. Able to follow basic commands and answer biographic information without difficulty. Reasonable insight and awareness.  Dense right hemiparesis with extensor tone RLE and flexor tone RUE. MAS tr -1 KE,PF and tr-1 right pecs, tr biceps. DTR's 3+ RUE and RLE and 2+ LUE and LLE. Motor: RUE- 0/5 prox to distal. RLE: 1/5 HF, KE and tr ADF/PF. Senses touch fairly equally right to left.    Skin: Skin is warm and dry.  Psychiatric: She has a normal mood and affect. Her behavior is normal.    Results for orders placed or performed during the hospital encounter of 02/26/16 (from the past 48 hour(s))  Basic metabolic panel     Status: Abnormal   Collection Time: 02/26/16  4:10 PM  Result Value Ref Range   Sodium 137 135 - 145 mmol/L   Potassium 4.4 3.5 - 5.1 mmol/L    Comment: HEMOLYSIS AT THIS LEVEL MAY AFFECT RESULT   Chloride 103 101 - 111 mmol/L   CO2 25 22 - 32 mmol/L   Glucose, Bld 134 (H) 65 - 99 mg/dL   BUN  19 6 - 20 mg/dL   Creatinine, Ser 0.95 0.44 - 1.00 mg/dL   Calcium 9.8 8.9 - 10.3 mg/dL   GFR calc non Af Amer 52 (L) >60 mL/min   GFR calc Af Amer 60 (L) >60 mL/min    Comment: (NOTE) The eGFR has been calculated using the CKD EPI equation. This calculation has not been validated in all clinical situations. eGFR's persistently <60 mL/min signify possible Chronic Kidney Disease.    Anion gap 9 5 - 15  CBC     Status: None   Collection Time: 02/26/16  4:10 PM  Result Value Ref Range   WBC 9.4 3.6 - 11.0 K/uL   RBC 5.07 3.80 - 5.20 MIL/uL   Hemoglobin 15.8 12.0 -  16.0 g/dL   HCT 47.0 35.0 - 47.0 %   MCV 92.6 80.0 - 100.0 fL   MCH 31.2 26.0 - 34.0 pg   MCHC 33.7 32.0 - 36.0 g/dL   RDW 14.2 11.5 - 14.5 %   Platelets 226 150 - 440 K/uL  Urinalysis, Complete w Microscopic     Status: Abnormal   Collection Time: 02/26/16  4:10 PM  Result Value Ref Range   Color, Urine YELLOW (A) YELLOW   APPearance CLEAR (A) CLEAR   Specific Gravity, Urine 1.009 1.005 - 1.030   pH 7.0 5.0 - 8.0   Glucose, UA NEGATIVE NEGATIVE mg/dL   Hgb urine dipstick NEGATIVE NEGATIVE   Bilirubin Urine NEGATIVE NEGATIVE   Ketones, ur NEGATIVE NEGATIVE mg/dL   Protein, ur 100 (A) NEGATIVE mg/dL   Nitrite NEGATIVE NEGATIVE   Leukocytes, UA NEGATIVE NEGATIVE   RBC / HPF 0-5 0 - 5 RBC/hpf   WBC, UA 0-5 0 - 5 WBC/hpf   Bacteria, UA NONE SEEN NONE SEEN   Squamous Epithelial / LPF NONE SEEN NONE SEEN  Troponin I     Status: None   Collection Time: 02/26/16  4:10 PM  Result Value Ref Range   Troponin I <0.03 <0.03 ng/mL  Troponin I     Status: None   Collection Time: 02/26/16  8:15 PM  Result Value Ref Range   Troponin I <0.03 <0.03 ng/mL  CBC     Status: None   Collection Time: 02/27/16  3:41 AM  Result Value Ref Range   WBC 10.3 3.6 - 11.0 K/uL   RBC 4.62 3.80 - 5.20 MIL/uL   Hemoglobin 14.5 12.0 - 16.0 g/dL   HCT 42.5 35.0 - 47.0 %   MCV 92.2 80.0 - 100.0 fL   MCH 31.3 26.0 - 34.0 pg   MCHC 34.0 32.0 - 36.0 g/dL   RDW 14.0 11.5 - 14.5 %   Platelets 203 150 - 440 K/uL  Creatinine, serum     Status: Abnormal   Collection Time: 02/27/16  3:41 AM  Result Value Ref Range   Creatinine, Ser 1.04 (H) 0.44 - 1.00 mg/dL   GFR calc non Af Amer 46 (L) >60 mL/min   GFR calc Af Amer 54 (L) >60 mL/min    Comment: (NOTE) The eGFR has been calculated using the CKD EPI equation. This calculation has not been validated in all clinical situations. eGFR's persistently <60 mL/min signify possible Chronic Kidney Disease.   Troponin I     Status: None   Collection Time: 02/27/16   3:41 AM  Result Value Ref Range   Troponin I <0.03 <0.03 ng/mL  Urine Drug Screen, Qualitative ([facility] only)     Status: None  Collection Time: 02/27/16  6:30 AM  Result Value Ref Range   Tricyclic, Ur Screen NONE DETECTED NONE DETECTED   Amphetamines, Ur Screen NONE DETECTED NONE DETECTED   MDMA (Ecstasy)Ur Screen NONE DETECTED NONE DETECTED   Cocaine Metabolite,Ur [place] NONE DETECTED NONE DETECTED   Opiate, Ur Screen NONE DETECTED NONE DETECTED   Phencyclidine (PCP) Ur S NONE DETECTED NONE DETECTED   Cannabinoid 50 Ng, Ur [place] NONE DETECTED NONE DETECTED   Barbiturates, Ur Screen NONE DETECTED NONE DETECTED   Benzodiazepine, Ur Scrn NONE DETECTED NONE DETECTED   Methadone Scn, Ur NONE DETECTED NONE DETECTED    Comment: (NOTE) 025  Tricyclics, urine               Cutoff 1000 ng/mL 200  Amphetamines, urine             Cutoff 1000 ng/mL 300  MDMA (Ecstasy), urine           Cutoff 500 ng/mL 400  Cocaine Metabolite, urine       Cutoff 300 ng/mL 500  Opiate, urine                   Cutoff 300 ng/mL 600  Phencyclidine (PCP), urine      Cutoff 25 ng/mL 700  Cannabinoid, urine              Cutoff 50 ng/mL 800  Barbiturates, urine             Cutoff 200 ng/mL 900  Benzodiazepine, urine           Cutoff 200 ng/mL 1000 Methadone, urine                Cutoff 300 ng/mL 1100 1200 The urine drug screen provides only a preliminary, unconfirmed 1300 analytical test result and should not be used for non-medical 1400 purposes. Clinical consideration and professional judgment should 1500 be applied to any positive drug screen result due to possible 1600 interfering substances. A more specific alternate chemical method 1700 must be used in order to obtain a confirmed analytical result.  1800 Gas chromato graphy / mass spectrometry (GC/MS) is the preferred 1900 confirmatory method.   Urinalysis, dipstick only     Status: Abnormal   Collection Time: 02/27/16  6:30 AM  Result Value Ref Range   Color,  Urine YELLOW (A) YELLOW   APPearance CLEAR (A) CLEAR   Specific Gravity, Urine 1.011 1.005 - 1.030   pH 7.0 5.0 - 8.0   Glucose, UA NEGATIVE NEGATIVE mg/dL   Hgb urine dipstick NEGATIVE NEGATIVE   Bilirubin Urine NEGATIVE NEGATIVE   Ketones, ur NEGATIVE NEGATIVE mg/dL   Protein, ur 30 (A) NEGATIVE mg/dL   Nitrite NEGATIVE NEGATIVE   Leukocytes, UA SMALL (A) NEGATIVE  Troponin I     Status: None   Collection Time: 02/27/16  9:21 AM  Result Value Ref Range   Troponin I <0.03 <0.03 ng/mL  Hemoglobin A1c     Status: Abnormal   Collection Time: 02/27/16  9:21 AM  Result Value Ref Range   Hgb A1c MFr Bld 6.0 (H) 4.8 - 5.6 %    Comment: (NOTE)         Pre-diabetes: 5.7 - 6.4         Diabetes: >6.4         Glycemic control for adults with diabetes: <7.0    Mean Plasma Glucose 126 mg/dL    Comment: (NOTE) Performed At: [facility] [address]  [address] [place], [place] [id number] Lindon Romp MD YW:[id number]   Lipid panel     Status: Abnormal   Collection Time: 02/27/16  9:21 AM  Result Value Ref Range   Cholesterol 205 (H) 0 - 200 mg/dL   Triglycerides 156 (H) <150 mg/dL   HDL 47 >40 mg/dL   Total CHOL/HDL Ratio 4.4 RATIO   VLDL 31 0 - 40 mg/dL   LDL Cholesterol 127 (H) 0 - 99 mg/dL    Comment:        Total Cholesterol/HDL:CHD Risk Coronary Heart Disease Risk Table                     Men   Women  1/2 Average Risk   3.4   3.3  Average Risk       5.0   4.4  2 X Average Risk   9.6   7.1  3 X Average Risk  23.4   11.0        Use the calculated Patient Ratio above and the CHD Risk Table to determine the patient's CHD Risk.        ATP III CLASSIFICATION (LDL):  <100     mg/dL   Optimal  100-129  mg/dL   Near or Above                    Optimal  130-159  mg/dL   Borderline  160-189  mg/dL   High  >190     mg/dL   Very High   Glucose, capillary     Status: Abnormal   Collection Time: 02/27/16 11:42 AM  Result Value Ref Range   Glucose-Capillary 108 (H) 65 - 99  mg/dL  MRSA PCR Screening     Status: None   Collection Time: 02/27/16 12:05 PM  Result Value Ref Range   MRSA by PCR NEGATIVE NEGATIVE    Comment:        The GeneXpert MRSA Assay (FDA approved for NASAL specimens only), is one component of a comprehensive MRSA colonization surveillance program. It is not intended to diagnose MRSA infection nor to guide or monitor treatment for MRSA infections.   Troponin I     Status: None   Collection Time: 02/27/16  3:32 PM  Result Value Ref Range   Troponin I <0.03 <0.03 ng/mL  Troponin I     Status: Abnormal   Collection Time: 02/27/16  9:19 PM  Result Value Ref Range   Troponin I 0.04 (HH) <0.03 ng/mL    Comment: CRITICAL RESULT CALLED TO, READ BACK BY AND VERIFIED WITH CALLED SILVIA FUENTES AT 2210 ON 02/27/16 BY SNJ   Troponin I     Status: Abnormal   Collection Time: 02/28/16  3:24 AM  Result Value Ref Range   Troponin I 0.06 (HH) <0.03 ng/mL    Comment: CRITICAL VALUE NOTED. VALUE IS CONSISTENT WITH PREVIOUSLY REPORTED/CALLED VALUE  SDR   Troponin I     Status: Abnormal   Collection Time: 02/28/16  9:21 AM  Result Value Ref Range   Troponin I 0.06 (HH) <0.03 ng/mL    Comment: CRITICAL VALUE NOTED. VALUE IS CONSISTENT WITH PREVIOUSLY REPORTED/CALLED VALUE JLJ   Dg Chest 2 View  Result Date: 02/26/2016 CLINICAL DATA:  83 y/o  F; dizziness and weakness. EXAM: CHEST  2 VIEW COMPARISON:  07/10/2013 chest radiograph FINDINGS: Stable cardiac silhouette within normal limits given projection and technique. Aortic atherosclerosis with calcification. No focal consolidation of the  lungs. Mild biapical pleuroparenchymal scarring. Multilevel degenerative changes of the thoracic spine. Expanded lungs with flattened diaphragms compatible with COPD. IMPRESSION: No active cardiopulmonary disease. Aortic atherosclerosis. Findings of COPD. Electronically Signed   By: Kristine Garbe M.D.   On: 02/26/2016 20:58   Ct Head Wo Contrast  Result  Date: 02/26/2016 CLINICAL DATA:  Acute onset of generalized weakness and slurred speech. Dizziness. Initial encounter. EXAM: CT HEAD WITHOUT CONTRAST TECHNIQUE: Contiguous axial images were obtained from the base of the skull through the vertex without intravenous contrast. COMPARISON:  CT of the head performed 10/25/2014 FINDINGS: Brain: No evidence of acute infarction, hemorrhage, hydrocephalus, extra-axial collection or mass lesion/mass effect. Prominence of the ventricles and sulci reflects mild to moderate cortical volume loss. Scattered periventricular and subcortical white matter change likely reflects small vessel ischemic microangiopathy. Cerebellar atrophy is noted. The brainstem and fourth ventricle are within normal limits. The basal ganglia are unremarkable in appearance. The cerebral hemispheres demonstrate grossly normal gray-white differentiation. No mass effect or midline shift is seen. Vascular: No hyperdense vessel or unexpected calcification. Skull: There is no evidence of fracture; visualized osseous structures are unremarkable in appearance. Sinuses/Orbits: The orbits are within normal limits. The paranasal sinuses and mastoid air cells are well-aerated. Other: No significant soft tissue abnormalities are seen. IMPRESSION: 1. No acute intracranial pathology seen on CT. 2. Mild to moderate cortical volume loss and scattered small vessel ischemic microangiopathy. Electronically Signed   By: Garald Balding M.D.   On: 02/26/2016 20:49   Mr Brain Wo Contrast  Result Date: 02/27/2016 CLINICAL DATA:  Stroke EXAM: MRI HEAD WITHOUT CONTRAST MRA HEAD WITHOUT CONTRAST TECHNIQUE: Multiplanar, multiecho pulse sequences of the brain and surrounding structures were obtained without intravenous contrast. Angiographic images of the head were obtained using MRA technique without contrast. COMPARISON:  CT 02/26/2016 FINDINGS: MRI HEAD FINDINGS Brain: Acute infarct left corona radiata extending into the  putamen. Small area of acute infarct body of the caudate on the left. No other areas of acute infarct. Mild generalized atrophy. Moderate chronic microvascular ischemic changes in the white matter and pons. Negative for hemorrhage or mass. No hydrocephalus or shift of the midline structures. Skull and upper cervical spine: Negative Sinuses/Orbits: Mild mucosal edema in the paranasal sinuses. Bilateral lens replacement. Other: None MRA HEAD FINDINGS Distal right vertebral artery patent to the basilar. Left vertebral artery non dominant and patent to the basilar. Basilar widely patent. Right PICA patent. Left PICA not visualized. AICA, superior cerebellar arteries patent bilaterally. Fetal origin of the posterior cerebral artery bilaterally with hypoplastic distal basilar. Internal carotid artery patent bilaterally without significant stenosis. Hypoplastic left A1 segment. Both anterior cerebral arteries supplied from the right. Middle cerebral arteries patent bilaterally. Negative for cerebral aneurysm. IMPRESSION: Acute infarct in the deep white matter on the left extending into the caudate and putamen. Atrophy with moderate chronic microvascular ischemia No significant intracranial stenosis.  No large vessel occlusion. Electronically Signed   By: Franchot Gallo M.D.   On: 02/27/2016 11:01   US Carotid Bilateral (at [facility] Only)  Result Date: 02/27/2016 CLINICAL DATA:  Stroke. History of hypertension, syncope, visual disturbance, hyperlipidemia. EXAM: BILATERAL CAROTID DUPLEX ULTRASOUND TECHNIQUE: Pearline Cables scale imaging, color Doppler and duplex ultrasound were performed of bilateral carotid and vertebral arteries in the neck. COMPARISON:  None. FINDINGS: Criteria: Quantification of carotid stenosis is based on velocity parameters that correlate the residual internal carotid diameter with NASCET-based stenosis levels, using the diameter of the distal internal carotid lumen  as the denominator for stenosis  measurement. The following velocity measurements were obtained: RIGHT ICA:  126/19 cm/sec CCA:  75/64 cm/sec SYSTOLIC ICA/CCA RATIO:  1.6 DIASTOLIC ICA/CCA RATIO:  1.6 ECA:  93 cm/sec LEFT ICA:  109/24 cm/sec CCA:  33/29 cm/sec SYSTOLIC ICA/CCA RATIO:  1.2 DIASTOLIC ICA/CCA RATIO:  1.6 ECA:  152 cm/sec RIGHT CAROTID ARTERY: Minimal atherosclerotic change in the carotid bulb without significant stenosis identified. Normal homogeneous flow without significant turbulence. RIGHT VERTEBRAL ARTERY:  Antegrade flow direction is demonstrated. LEFT CAROTID ARTERY: Mild areas of calcific atherosclerotic change in the carotid bulb. No significant stenosis identified. Normal homogeneous flow without significant turbulence. LEFT VERTEBRAL ARTERY:  Antegrade flow direction is demonstrated. IMPRESSION: No evidence of hemodynamically significant stenosis in either right or the left internal carotid artery. Nonocclusive calcific plaque formation in the left carotid bulb. Electronically Signed   By: Lucienne Capers M.D.   On: 02/27/2016 06:42   Mr Jodene Nam Head/brain JJ Cm  Result Date: 02/27/2016 CLINICAL DATA:  Stroke EXAM: MRI HEAD WITHOUT CONTRAST MRA HEAD WITHOUT CONTRAST TECHNIQUE: Multiplanar, multiecho pulse sequences of the brain and surrounding structures were obtained without intravenous contrast. Angiographic images of the head were obtained using MRA technique without contrast. COMPARISON:  CT 02/26/2016 FINDINGS: MRI HEAD FINDINGS Brain: Acute infarct left corona radiata extending into the putamen. Small area of acute infarct body of the caudate on the left. No other areas of acute infarct. Mild generalized atrophy. Moderate chronic microvascular ischemic changes in the white matter and pons. Negative for hemorrhage or mass. No hydrocephalus or shift of the midline structures. Skull and upper cervical spine: Negative Sinuses/Orbits: Mild mucosal edema in the paranasal sinuses. Bilateral lens replacement. Other: None MRA  HEAD FINDINGS Distal right vertebral artery patent to the basilar. Left vertebral artery non dominant and patent to the basilar. Basilar widely patent. Right PICA patent. Left PICA not visualized. AICA, superior cerebellar arteries patent bilaterally. Fetal origin of the posterior cerebral artery bilaterally with hypoplastic distal basilar. Internal carotid artery patent bilaterally without significant stenosis. Hypoplastic left A1 segment. Both anterior cerebral arteries supplied from the right. Middle cerebral arteries patent bilaterally. Negative for cerebral aneurysm. IMPRESSION: Acute infarct in the deep white matter on the left extending into the caudate and putamen. Atrophy with moderate chronic microvascular ischemia No significant intracranial stenosis.  No large vessel occlusion. Electronically Signed   By: Franchot Gallo M.D.   On: 02/27/2016 11:01       Medical Problem List and Plan: 1.  Right hemiparesis and dysarthria/dysphagia secondary to left corona radiata,putamen,caudate infarct 2.  DVT Prophylaxis/Anticoagulation: Pharmaceutical: Lovenox 3. Pain Management: tylenol prn 4. Mood:  Reporting some anxiety and fear--wanted to go ahead and start medication. Will start prozac to help with mood and recovery.  Has history of anxiety--monitor for now. LCSW to follow for evaluation and support.  5. Neuropsych: This patient is capable of making decisions on her own behalf. 6. Skin/Wound Care: Routine pressure relief measures. Maintain adequate nutritional and hydration status.  7. Fluids/Electrolytes/Nutrition: Monitor I/O. Supplements between meals. Recheck lytes in am.  8. HTN: Monitor BP bid--avoid hypotension to allow for adequate perfusion. On Norvasc, lisinopril and metoprolol bid.  9. Dysphagia: Continue dysphagia 1, nectar liquids. Offer fluids between meals to maintain adequate hydration.   10. Dyslipidemia: on lipitor 11. Prediabetes: Hgb A1c- 6.0--has been stable. Will modify  diet to  HH/CM. RD to educate patient and family on appropriate diet.  12. GERD: Managed On Protonix.  13. Chronic  constipation: Uses softners as well as suppository daily. Will start senna S at supper followed by suppository in am.   14. Emerging right spastic hemiparesis: Will order baclofen prn for now. Resting hand splint and R-AFO. ROM with therapy.   -reviewed stretching,positioning with family    Post Admission Physician Evaluation: 1. Functional deficits secondary  to left subcortical/deep cortical infarct. 2. Patient is admitted to receive collaborative, interdisciplinary care between the physiatrist, rehab nursing staff, and therapy team. 3. Patient's level of medical complexity and substantial therapy needs in context of that medical necessity cannot be provided at a lesser intensity of care such as a SNF. 4. Patient has experienced substantial functional loss from his/her baseline which was documented above under the "Functional History" and "Functional Status" headings.  Judging by the patient's diagnosis, physical exam, and functional history, the patient has potential for functional progress which will result in measurable gains while on inpatient rehab.  These gains will be of substantial and practical use upon discharge  in facilitating mobility and self-care at the household level. 5. Physiatrist will provide 24 hour management of medical needs as well as oversight of the therapy plan/treatment and provide guidance as appropriate regarding the interaction of the two. 6. The Preadmission Screening has been reviewed and patient status is unchanged unless otherwise stated above. 7. 24 hour rehab nursing will assist with bladder management, bowel management, safety, skin/wound care, disease management, medication administration and patient education  and help integrate therapy concepts, techniques,education, etc. 8. PT will assess and treat for/with: Lower extremity strength, range of  motion, stamina, balance, functional mobility, safety, adaptive techniques and equipment, NMR, spasticity mgt, w/c use, stroke education, community re-entry.   Goals are: min to mod assist 9. OT will assess and treat for/with: Lower extremity strength, range of motion, stamina, balance, functional mobility, safety, adaptive techniques and equipment, NMR, spastiicty mgt, orthotics, family education, ego support.   Goals are: min assist. Therapy may proceed with showering this patient. 10. SLP will assess and treat for/with: communication, swallowing, education.  Goals are: mod I to supervision. 11. Case Management and Social Worker will assess and treat for psychological issues and discharge planning. 12. Team conference will be held weekly to assess progress toward goals and to determine barriers to discharge. 13. Patient will receive at least 3 hours of therapy per day at least 5 days per week. 14. ELOS: 18-24 days       15. Prognosis:  excellent  Spent time after patient evaluation counseling patient's family regarding prognosis and expectations moving forward.    Meredith Staggers, MD, [facility] 02/29/2016  Bary Leriche, PA-C 02/28/2016

## 2016-02-28 NOTE — Progress Notes (Signed)
Physical Therapy Treatment Patient Details Name: Christina Bradshaw MRN: [id number] DOB: [birth year] Today's Date: 02/28/2016    History of Present Illness Pt is an 83 y.o. female presenting to hospital with weakness (R sided), dizziness, slurred speech, and R facial droop.  Pt admitted with acute basal ganglia infarct.  PMH includes htn, vertigo, overactive bladder.    PT Comments    Pt agrees to session and remain highly motivated to increase independence.  Participated in exercises as described below.  She continues to display decreased strength throughout RLE.  She is able to complete all supine exercises with minimal assist  from writer but has increased difficulty with seated exercises requiring more assist and is unable to initiate movement without assist.  Supine to edge of bed with mod a x 1.  Once sitting she is able to hold her position with occasional min assist to prevent LOB.  She was able to stand at bedside with mod a x 2 and use of hemiwalker.  Due to flacid RUE a makeshift sling was used to support her RUE/shoulder.  She was able to stand for 1 minute with weight shift left and right and attempted to pick up her RLE but was unable to fully clear her foot off the floor.  After seated rest, she was able to stand and transfer with hemiwalker to recliner at bedside.  Pt required mod a x 2 due to flexed posture and post/right lean.  She tolerated session well and remained up in recliner.  Discussed transfer status with CNA.  Daughter in attendance for session.   Follow Up Recommendations  CIR     Equipment Recommendations       Recommendations for Other Services       Precautions / Restrictions Precautions Precautions: Fall Precaution Comments: Aspiration; Seizure Restrictions Weight Bearing Restrictions: No    Mobility  Bed Mobility Overal bed mobility: Needs Assistance Bed Mobility: Supine to Sit     Supine to sit: Mod assist;HOB elevated     General bed mobility  comments: assist for R LE and trunk supine to/from sit; vc's for technique required  Transfers Overall transfer level: Needs assistance Equipment used: Hemi-walker Transfers: Sit to/from [employer] Sit to Stand: Mod assist Stand pivot transfers: Mod assist;+2 physical assistance       General transfer comment: able to stand and transfer with HW and RUE supported in sling/belt.  R knee hyperextends during standing/stepping  Ambulation/Gait Ambulation/Gait assistance: Mod assist;+2 physical assistance Ambulation Distance (Feet): 3 Feet Assistive device: Hemi-walker Gait Pattern/deviations: Step-to pattern;Trunk flexed     General Gait Details: limited ambulation    Stairs            Wheelchair Mobility    Modified Rankin (Stroke Patients Only)       Balance Overall balance assessment: Needs assistance Sitting-balance support: Single extremity supported;Feet unsupported Sitting balance-Leahy Scale: Poor Sitting balance - Comments: intermittent posterior lean requiring min assist to correct (otherwise pt CGA) Postural control: Posterior lean;Left lateral lean Standing balance support: Single extremity supported Standing balance-Leahy Scale: Poor Standing balance comment: unable to remain standing without assist                    Cognition Arousal/Alertness: Awake/alert Behavior During Therapy: WFL for tasks assessed/performed Overall Cognitive Status: Within Functional Limits for tasks assessed                      Exercises Other Exercises Other Exercises:  supine exercises with focus on RLE for ankle pumps, heel slides, ab/add, and SAQ AAROM x 10 Other Exercises: bridging x 10 with good effort.  she was able to clear her hips off of the bed. Other Exercises: seated pillow squeeze, LAQ and hip/knee flexion.  Pt needed increased assist for LAQ and hip/knee flex.    General Comments        Pertinent Vitals/Pain Pain  Assessment: No/denies pain    Home Living                      Prior Function            PT Goals (current goals can now be found in the care plan section) Progress towards PT goals: Progressing toward goals    Frequency    7X/week      PT Plan Current plan remains appropriate    Co-evaluation             End of Session Equipment Utilized During Treatment: Gait belt Activity Tolerance: Patient tolerated treatment well Patient left: in chair;with call bell/phone within reach;with family/visitor present;with chair alarm set     Time: [id number] PT Time Calculation (min) (ACUTE ONLY): 28 min  Charges:  $Therapeutic Exercise: 8-22 mins $Therapeutic Activity: 8-22 mins                    G Codes:      Danielle DessSarah Mikinzie Maciejewski 02/28/2016, 11:50 AM

## 2016-02-28 NOTE — Progress Notes (Signed)
Subjective: Worsening of right sided weakness.    Objective: Current vital signs: BP (!) 144/66   Pulse 95   Temp 97.9 F (36.6 C) (Oral)   Resp 20   Ht 5\' 7"  (1.702 m)   Wt 66.5 kg (146 lb 9.7 oz)   SpO2 98%   BMI 22.96 kg/m  Vital signs in last 24 hours: Temp:  [97.8 F (36.6 C)-99.4 F (37.4 C)] 97.9 F (36.6 C) (01/11 1313) Pulse Rate:  [93-119] 95 (01/11 1313) Resp:  [18-21] 20 (01/11 0456) BP: (144-195)/(66-80) 144/66 (01/11 1313) SpO2:  [96 %-99 %] 98 % (01/11 1313)  Intake/Output from previous day: 01/10 0701 - 01/11 0700 In: 707 [I.V.:707] Out: -  Intake/Output this shift: No intake/output data recorded. Nutritional status: DIET - DYS 1 Room service appropriate? Yes with Assist; Fluid consistency: Nectar Thick  Neurologic Exam: Mental Status: Alert, thought content appropriate.  Noted to have some word finding issues.  Able to follow 3 step commands without difficulty. Cranial Nerves: II: Discs flat bilaterally; Visual fields grossly normal, pupils equal, round, reactive to light and accommodation III,IV, VI: ptosis not present, extra-ocular motions intact bilaterally V,VII: right facial droop, facial light touch sensation normal bilaterally VIII: hearing normal bilaterally IX,X: gag reflex reduced XI: bilateral shoulder shrug XII: midline tongue extension Motor: Right :  Upper extremity   0/5                                      Left:     Upper extremity   5/5             Lower extremity   0/5                                                  Lower extremity   5/5 Tone and bulk:normal tone throughout; no atrophy noted   Lab Results: Basic Metabolic Panel:  Recent Labs Lab 02/26/16 1610 02/27/16 0341  NA 137  --   K 4.4  --   CL 103  --   CO2 25  --   GLUCOSE 134*  --   BUN 19  --   CREATININE 0.95 1.04*  CALCIUM 9.8  --     Liver Function Tests: No results for input(s): AST, ALT, ALKPHOS, BILITOT, PROT, ALBUMIN in the last 168 hours. No  results for input(s): LIPASE, AMYLASE in the last 168 hours. No results for input(s): AMMONIA in the last 168 hours.  CBC:  Recent Labs Lab 02/26/16 1610 02/27/16 0341  WBC 9.4 10.3  HGB 15.8 14.5  HCT 47.0 42.5  MCV 92.6 92.2  PLT 226 203    Cardiac Enzymes:  Recent Labs Lab 02/27/16 0921 02/27/16 1532 02/27/16 2119 02/28/16 0324 02/28/16 0921  TROPONINI <0.03 <0.03 0.04* 0.06* 0.06*    Lipid Panel:  Recent Labs Lab 02/27/16 0921  CHOL 205*  TRIG 156*  HDL 47  CHOLHDL 4.4  VLDL 31  LDLCALC 161*    CBG:  Recent Labs Lab 02/27/16 1142  GLUCAP 108*    Microbiology: Results for orders placed or performed during the hospital encounter of 02/26/16  MRSA PCR Screening     Status: None   Collection Time: 02/27/16 12:05 PM  Result Value Ref Range Status  MRSA by PCR NEGATIVE NEGATIVE Final    Comment:        The GeneXpert MRSA Assay (FDA approved for NASAL specimens only), is one component of a comprehensive MRSA colonization surveillance program. It is not intended to diagnose MRSA infection nor to guide or monitor treatment for MRSA infections.     Coagulation Studies: No results for input(s): LABPROT, INR in the last 72 hours.  Imaging: Dg Chest 2 View  Result Date: 02/26/2016 CLINICAL DATA:  83 y/o  F; dizziness and weakness. EXAM: CHEST  2 VIEW COMPARISON:  07/10/2013 chest radiograph FINDINGS: Stable cardiac silhouette within normal limits given projection and technique. Aortic atherosclerosis with calcification. No focal consolidation of the lungs. Mild biapical pleuroparenchymal scarring. Multilevel degenerative changes of the thoracic spine. Expanded lungs with flattened diaphragms compatible with COPD. IMPRESSION: No active cardiopulmonary disease. Aortic atherosclerosis. Findings of COPD. Electronically Signed   By: Mitzi Hansen M.D.   On: 02/26/2016 20:58   Ct Head Wo Contrast  Result Date: 02/26/2016 CLINICAL DATA:  Acute  onset of generalized weakness and slurred speech. Dizziness. Initial encounter. EXAM: CT HEAD WITHOUT CONTRAST TECHNIQUE: Contiguous axial images were obtained from the base of the skull through the vertex without intravenous contrast. COMPARISON:  CT of the head performed 10/25/2014 FINDINGS: Brain: No evidence of acute infarction, hemorrhage, hydrocephalus, extra-axial collection or mass lesion/mass effect. Prominence of the ventricles and sulci reflects mild to moderate cortical volume loss. Scattered periventricular and subcortical white matter change likely reflects small vessel ischemic microangiopathy. Cerebellar atrophy is noted. The brainstem and fourth ventricle are within normal limits. The basal ganglia are unremarkable in appearance. The cerebral hemispheres demonstrate grossly normal gray-white differentiation. No mass effect or midline shift is seen. Vascular: No hyperdense vessel or unexpected calcification. Skull: There is no evidence of fracture; visualized osseous structures are unremarkable in appearance. Sinuses/Orbits: The orbits are within normal limits. The paranasal sinuses and mastoid air cells are well-aerated. Other: No significant soft tissue abnormalities are seen. IMPRESSION: 1. No acute intracranial pathology seen on CT. 2. Mild to moderate cortical volume loss and scattered small vessel ischemic microangiopathy. Electronically Signed   By: Roanna Raider M.D.   On: 02/26/2016 20:49   Mr Brain Wo Contrast  Result Date: 02/27/2016 CLINICAL DATA:  Stroke EXAM: MRI HEAD WITHOUT CONTRAST MRA HEAD WITHOUT CONTRAST TECHNIQUE: Multiplanar, multiecho pulse sequences of the brain and surrounding structures were obtained without intravenous contrast. Angiographic images of the head were obtained using MRA technique without contrast. COMPARISON:  CT 02/26/2016 FINDINGS: MRI HEAD FINDINGS Brain: Acute infarct left corona radiata extending into the putamen. Small area of acute infarct body of  the caudate on the left. No other areas of acute infarct. Mild generalized atrophy. Moderate chronic microvascular ischemic changes in the white matter and pons. Negative for hemorrhage or mass. No hydrocephalus or shift of the midline structures. Skull and upper cervical spine: Negative Sinuses/Orbits: Mild mucosal edema in the paranasal sinuses. Bilateral lens replacement. Other: None MRA HEAD FINDINGS Distal right vertebral artery patent to the basilar. Left vertebral artery non dominant and patent to the basilar. Basilar widely patent. Right PICA patent. Left PICA not visualized. AICA, superior cerebellar arteries patent bilaterally. Fetal origin of the posterior cerebral artery bilaterally with hypoplastic distal basilar. Internal carotid artery patent bilaterally without significant stenosis. Hypoplastic left A1 segment. Both anterior cerebral arteries supplied from the right. Middle cerebral arteries patent bilaterally. Negative for cerebral aneurysm. IMPRESSION: Acute infarct in the deep white matter on  the left extending into the caudate and putamen. Atrophy with moderate chronic microvascular ischemia No significant intracranial stenosis.  No large vessel occlusion. Electronically Signed   By: Marlan Palauharles  Clark M.D.   On: 02/27/2016 11:01   [place] Carotid Bilateral (at [facility] And Ap Only)  Result Date: 02/27/2016 CLINICAL DATA:  Stroke. History of hypertension, syncope, visual disturbance, hyperlipidemia. EXAM: BILATERAL CAROTID DUPLEX ULTRASOUND TECHNIQUE: Wallace CullensGray scale imaging, color Doppler and duplex ultrasound were performed of bilateral carotid and vertebral arteries in the neck. COMPARISON:  None. FINDINGS: Criteria: Quantification of carotid stenosis is based on velocity parameters that correlate the residual internal carotid diameter with NASCET-based stenosis levels, using the diameter of the distal internal carotid lumen as the denominator for stenosis measurement. The following velocity measurements  were obtained: RIGHT ICA:  126/19 cm/sec CCA:  77/12 cm/sec SYSTOLIC ICA/CCA RATIO:  1.6 DIASTOLIC ICA/CCA RATIO:  1.6 ECA:  93 cm/sec LEFT ICA:  109/24 cm/sec CCA:  94/14 cm/sec SYSTOLIC ICA/CCA RATIO:  1.2 DIASTOLIC ICA/CCA RATIO:  1.6 ECA:  152 cm/sec RIGHT CAROTID ARTERY: Minimal atherosclerotic change in the carotid bulb without significant stenosis identified. Normal homogeneous flow without significant turbulence. RIGHT VERTEBRAL ARTERY:  Antegrade flow direction is demonstrated. LEFT CAROTID ARTERY: Mild areas of calcific atherosclerotic change in the carotid bulb. No significant stenosis identified. Normal homogeneous flow without significant turbulence. LEFT VERTEBRAL ARTERY:  Antegrade flow direction is demonstrated. IMPRESSION: No evidence of hemodynamically significant stenosis in either right or the left internal carotid artery. Nonocclusive calcific plaque formation in the left carotid bulb. Electronically Signed   By: Burman NievesWilliam  Stevens M.D.   On: 02/27/2016 06:42   Mr Maxine GlennMra Head/brain ZOWo Cm  Result Date: 02/27/2016 CLINICAL DATA:  Stroke EXAM: MRI HEAD WITHOUT CONTRAST MRA HEAD WITHOUT CONTRAST TECHNIQUE: Multiplanar, multiecho pulse sequences of the brain and surrounding structures were obtained without intravenous contrast. Angiographic images of the head were obtained using MRA technique without contrast. COMPARISON:  CT 02/26/2016 FINDINGS: MRI HEAD FINDINGS Brain: Acute infarct left corona radiata extending into the putamen. Small area of acute infarct body of the caudate on the left. No other areas of acute infarct. Mild generalized atrophy. Moderate chronic microvascular ischemic changes in the white matter and pons. Negative for hemorrhage or mass. No hydrocephalus or shift of the midline structures. Skull and upper cervical spine: Negative Sinuses/Orbits: Mild mucosal edema in the paranasal sinuses. Bilateral lens replacement. Other: None MRA HEAD FINDINGS Distal right vertebral artery  patent to the basilar. Left vertebral artery non dominant and patent to the basilar. Basilar widely patent. Right PICA patent. Left PICA not visualized. AICA, superior cerebellar arteries patent bilaterally. Fetal origin of the posterior cerebral artery bilaterally with hypoplastic distal basilar. Internal carotid artery patent bilaterally without significant stenosis. Hypoplastic left A1 segment. Both anterior cerebral arteries supplied from the right. Middle cerebral arteries patent bilaterally. Negative for cerebral aneurysm. IMPRESSION: Acute infarct in the deep white matter on the left extending into the caudate and putamen. Atrophy with moderate chronic microvascular ischemia No significant intracranial stenosis.  No large vessel occlusion. Electronically Signed   By: Marlan Palauharles  Clark M.D.   On: 02/27/2016 11:01    Medications:  I have reviewed the patient's current medications. Scheduled: . amLODipine  5 mg Oral Daily  . aspirin  81 mg Oral Daily  . atorvastatin  40 mg Oral q1800  . cholecalciferol  500 Units Oral Daily  . docusate sodium  100 mg Oral QHS  . enoxaparin (LOVENOX) injection  40 mg  Subcutaneous Q24H  . levothyroxine  75 mcg Oral QAC breakfast  . lisinopril  40 mg Oral Daily  . mouth rinse  15 mL Mouth Rinse BID  . metoprolol tartrate  25 mg Oral BID  . multivitamin with minerals  1 tablet Oral Daily  . pantoprazole  40 mg Oral Daily    Assessment/Plan: Patient with worsening of right sided strength.  Carotid dopplers show no evidence of hemodynamically significant stenosis.  Echocardiogram shows no cardiac source of emboli with an EF of 60-65%.  A1c 6.0, LDL 127.  On ASA.    Recommendations: 1.  Continue ASA and statin 2.  Continue therapy   LOS: 1 day   Thana Farr, MD Neurology [phone] 02/28/2016  1:41 PM

## 2016-02-28 NOTE — Progress Notes (Signed)
[facility] admissions - Daughter, sons and patient [facility] that [facility]  I will follow up with case manager in am with bed availability and room number.  Will plan inpatient rehab admit for tomorrow.  Call me for questions.  #[phone]

## 2016-02-28 NOTE — Evaluation (Signed)
Objective Swallowing Evaluation: Type of Study: MBS-Modified Barium Swallow Study  Patient Details  Name: Christina Bradshaw MRN: [id number] Date of Birth: 09-07-1924  Today's Date: 02/28/2016 Time: SLP Start Time (ACUTE ONLY): 1400-SLP Stop Time (ACUTE ONLY): 1500 SLP Time Calculation (min) (ACUTE ONLY): 60 min  Past Medical History:  Past Medical History:  Diagnosis Date  . Hypertension   . Stroke [facility](H[facility])    Past Surgical History:  Past Surgical History:  Procedure Laterality Date  . ABDOMINAL HYSTERECTOMY     HPI: Pt is a 83 y.o. female with a known history of hypertension, vertigo, hyperlipidemia, GERD, osteoporosis, overactive bladder, hypothyroidism was last seen normal when she spoke to her daughter about 24 hours prior to arrival. Patient slept through the night, but in the morning she received a call from her daughter-in-law who commented that her speech was weak and that the patient did not sound like herself. The patient stated that she felt dizzy so she took some meclizine and took a nap. Patient's son then called later in the day and found her to still sound unlike herself so he went to her home, found her to be weak with speech disturbance and a right sided facial droop. She states that despite her speech difficulty she has been ambulating about her home and did not notice any focal weakness, confusion, sensory disturbance. Her daughter noted that she had difficulty writing her name in triage. Currently, pt is verbal but w/ low volume of speech mildy dysarthric but understandable. She followed basic instructions; y/n questions re: self.   [facility] alert with accurate yes/no response.  Minimal speech.   Assessment / Plan / Recommendation  CHL IP CLINICAL IMPRESSIONS 02/28/2016  Therapy Diagnosis Moderate oral phase dysphagia;Moderate pharyngeal phase dysphagia  Clinical Impression This 83 year old woman; with acute CVA 02/27/2016; is presenting with  moderate oropharyngeal dysphagia characterized by slow and disorganized oral management, delayed pharyngeal swallow initiation, decreased tongue base retraction, decreased hyolaryngeal movement, and incomplete epiglottic inversion (with mild-to-moderate vallecular residue depending on bolus size).  With thin liquids there was no laryngeal penetration with scant teaspoon presentation and deep laryngeal penetration (to the vocal cords) before the swallow.  The patient had an immediate throat clear that appears to be effective (no visible contrast in the laryngeal vestibule in the next view).  With nectar-thick liquid there was laryngeal penetration (with spontaneous, effective throat clear) in 2 of 8 trials.  The patient had significant difficulty pulling nectar-thick liquid through a straw.  There was no observed laryngeal penetration with pureed solid or crumbled graham cracker in applesauce.  This study supports Dysphagia I diet with nectar-thick liquids.  Recommend therapeutic trials of thin liquid by scant teaspoon and Dysphagia II solids with speech therapy.  The patient will benefit from oral motor and laryngeal/pharyngeal progressive strengthening exercises targeting bolus manipulation, hyolaryngeal movement, and tongue base retraction.    Impact on safety and function --      CHL IP TREATMENT RECOMMENDATION 02/28/2016  Treatment Recommendations Defer treatment plan to f/u with SLP     Prognosis 02/28/2016  Prognosis for Safe Diet Advancement Good  Barriers to Reach Goals --  Barriers/Prognosis Comment --    CHL IP DIET RECOMMENDATION 02/28/2016  SLP Diet Recommendations Dysphagia 1 (Puree) solids;Nectar thick liquid  Liquid Administration via Cup  Medication Administration Whole meds with puree  Compensations --  Postural Changes --      CHL IP OTHER RECOMMENDATIONS 02/28/2016  Recommended Consults --  Oral Care Recommendations Oral care before and after PO  Other Recommendations --       CHL IP FOLLOW UP RECOMMENDATIONS 02/27/2016  Follow up Recommendations Skilled Nursing facility      [facility] IP FREQUENCY AND DURATION 02/27/2016  Speech Therapy Frequency (ACUTE ONLY) min 3x week  Treatment Duration 2 weeks           CHL IP ORAL PHASE 02/28/2016  Oral Phase Impaired  Oral - Pudding Teaspoon --  Oral - Pudding Cup --  Oral - Honey Teaspoon --  Oral - Honey Cup --  Oral - Nectar Teaspoon --  Oral - Nectar Cup --  Oral - Nectar Straw --  Oral - Thin Teaspoon --  Oral - Thin Cup --  Oral - Thin Straw --  Oral - Puree --  Oral - Mech Soft --  Oral - Regular --  Oral - Multi-Consistency --  Oral - Pill --  Oral Phase - Comment Patient demonstrates slowed, disorganized posterior transfer.  Reduced ability to pull nectar-thick liquid via straw.  Able to manage crumbled graham cracker in applesauce.    CHL IP PHARYNGEAL PHASE 02/28/2016  Pharyngeal Phase Impaired  Pharyngeal- Pudding Teaspoon --  Pharyngeal --  Pharyngeal- Pudding Cup --  Pharyngeal --  Pharyngeal- Honey Teaspoon --  Pharyngeal --  Pharyngeal- Honey Cup --  Pharyngeal --  Pharyngeal- Nectar Teaspoon --  Pharyngeal --  Pharyngeal- Nectar Cup --  Pharyngeal --  Pharyngeal- Nectar Straw --  Pharyngeal --  Pharyngeal- Thin Teaspoon --  Pharyngeal --  Pharyngeal- Thin Cup --  Pharyngeal --  Pharyngeal- Thin Straw --  Pharyngeal --  Pharyngeal- Puree --  Pharyngeal --  Pharyngeal- Mechanical Soft --  Pharyngeal --  Pharyngeal- Regular --  Pharyngeal --  Pharyngeal- Multi-consistency --  Pharyngeal --  Pharyngeal- Pill --  Pharyngeal --  Pharyngeal Comment delayed pharyngeal swallow initiation (triggers while falling from the valleculae to the pyriform sinuses), decreased tongue base retraction, decreased hyolaryngeal movement, and incomplete epiglottic inversion, with mild-to-moderate vallecular residue depending on bolus size.      CHL IP CERVICAL ESOPHAGEAL PHASE 02/28/2016  Cervical  Esophageal Phase WFL  Pudding Teaspoon --  Pudding Cup --  Honey Teaspoon --  Honey Cup --  Nectar Teaspoon --  Nectar Cup --  Nectar Straw --  Thin Teaspoon --  Thin Cup --  Thin Straw --  Puree --  Mechanical Soft --  Regular --  Multi-consistency --  Pill --  Cervical Esophageal Comment --    No flowsheet data found.   Dollene Primrose, MS/CCC- SLP  Leandrew Koyanagi 02/28/2016, 11:34 AM

## 2016-02-28 NOTE — Progress Notes (Signed)
[facility] admissions - I spoke with patient's daughter by phone.  She wants to speak with her brothers about rehab prior to [place].  I do have a bed available today for patient.  Call me for questions.  #[phone]

## 2016-02-28 NOTE — Evaluation (Signed)
Occupational Therapy Evaluation Patient Details Name: MICHEALE SCHLACK MRN: [id number] DOB: 1925-01-30 Today's Date: 02/28/2016    History of Present Illness Pt is an 83 y.o. female presenting to hospital with weakness (R sided), dizziness, slurred speech, and R facial droop.  Pt admitted with acute basal ganglia infarct.  PMH includes htn, vertigo, overactive bladder.   Clinical Impression   83yo female pt presenting with R sided weakness s/p acute basal ganglia infarct. Pt very motivated to participate with good family support. Pt able to follow 1-2 step instructions and able to answer questions with slightly slurred speech and low volume.  Pt unable to initiate movement in RUE, ROM WFL. Pt and family educated in positioning to protect RUE and minimize swelling/edema and injury. Pt would benefit from CIR for skilled OT services to maximize functional return to R side and independence with ADL.     Follow Up Recommendations  CIR    Equipment Recommendations  Other (comment) (pending pt progress)    Recommendations for Other Services       Precautions / Restrictions Precautions Precautions: Fall Precaution Comments: Aspiration; Seizure Restrictions Weight Bearing Restrictions: No      Mobility Bed Mobility Overal bed mobility: Needs Assistance Bed Mobility: Supine to Sit     Supine to sit: Mod assist;HOB elevated     General bed mobility comments: did not attempt bed mobility during session, please defer to PT tx notes for 1/11  Transfers Overall transfer level: Needs assistance Equipment used: Hemi-walker Transfers: Sit to/from [employer] Sit to Stand: Mod assist Stand pivot transfers: Mod assist;+2 physical assistance       General transfer comment: did not attempt transfers during session due to pt fatigue and safety, please defer to PT tx notes for 1/11    Balance Overall balance assessment: Needs assistance Sitting-balance support:  Single extremity supported;Feet unsupported Sitting balance-Leahy Scale: Poor Sitting balance - Comments: intermittent posterior lean requiring min assist to correct (otherwise pt CGA) Postural control: Posterior lean;Left lateral lean Standing balance support: Single extremity supported Standing balance-Leahy Scale: Poor Standing balance comment: unable to remain standing without assist                            ADL Overall ADL's : Needs assistance/impaired Eating/Feeding: Set up;Supervision/ safety;Minimal assistance Eating/Feeding Details (indicate cue type and reason): Pt requires set up of tray and assist to open containers/packaging, pt able to self feed using L hand indep Grooming: Wash/dry face;Oral care;Set up;Sitting;Cueing for compensatory techniques Grooming Details (indicate cue type and reason): using LUE Upper Body Bathing: Bed level;Moderate assistance   Lower Body Bathing: Maximal assistance;Bed level   Upper Body Dressing : Cueing for compensatory techniques;Moderate assistance Upper Body Dressing Details (indicate cue type and reason): using 1-handed techniques Lower Body Dressing: Maximal assistance;Sit to/from stand;With adaptive equipment;Cueing for compensatory techniques     Toilet Transfer Details (indicate cue type and reason): did not attempt due to fatigue/safety following PT session out of bed and pt in chair ready for meal           General ADL Comments: Pt requires varying levels of assistance for ADL task due to R sided weakness and would benefit from education/training in AE and compensatory strategies to improve functional independence with ADL.     Vision Vision Assessment?: No apparent visual deficits   Perception     Praxis      Pertinent Vitals/Pain Pain Assessment: No/denies  pain     Hand Dominance     Extremity/Trunk Assessment Upper Extremity Assessment Upper Extremity Assessment: RUE deficits/detail;LUE  deficits/detail RUE Deficits / Details: RUE flaccid, difficult to assess activation in shoulder RUE Coordination: decreased fine motor;decreased gross motor LUE Deficits / Details: strength and ROM WFL   Lower Extremity Assessment Lower Extremity Assessment: Defer to PT evaluation       Communication Communication Communication: No difficulties   Cognition Arousal/Alertness: Awake/alert Behavior During Therapy: WFL for tasks assessed/performed Overall Cognitive Status: Within Functional Limits for tasks assessed                     General Comments       Exercises   Other Exercises Other Exercises: supine exercises with focus on RLE for ankle pumps, heel slides, ab/add, and SAQ AAROM x 10 Other Exercises: bridging x 10 with good effort.  she was able to clear her hips off of the bed. Other Exercises: seated pillow squeeze, LAQ and hip/knee flexion.  Pt needed increased assist for LAQ and hip/knee flex.   Shoulder Instructions      Home Living Family/patient expects to be discharged to:: Private residence Living Arrangements: Alone Available Help at Discharge: Family;Available PRN/intermittently Type of Home: House Home Access: Stairs to enter Entrance Stairs-Number of Steps: 5 STE Entrance Stairs-Rails: Left Home Layout: Two level;Able to live on main level with bedroom/bathroom     Bathroom Shower/Tub: Tub/shower unit Shower/tub characteristics: Engineer, building servicesCurtain Bathroom Toilet: Standard Bathroom Accessibility: Yes How Accessible: Accessible via walker Home Equipment: Cane - single point;Cane - quad          Prior Functioning/Environment Level of Independence: Independent with assistive device(s)        Comments: Pt independent (except does not drive) and occasionally uses SPC outside on uneven ground.  Pt denies any falls in past 6 months.        OT Problem List: Decreased strength;Decreased range of motion;Impaired sensation;Decreased knowledge of use of  DME or AE;Impaired UE functional use   OT Treatment/Interventions: Self-care/ADL training;Therapeutic exercise;Therapeutic activities;Neuromuscular education;Patient/family education;DME and/or AE instruction    OT Goals(Current goals can be found in the care plan section) Acute Rehab OT Goals Patient Stated Goal: get stronger OT Goal Formulation: With patient/family Time For Goal Achievement: 03/13/16 Potential to Achieve Goals: Good  OT Frequency: Min 1X/week   Barriers to D/C: Decreased caregiver support  PRN assistance available from family at home, lives alone       Co-evaluation              End of Session    Activity Tolerance: Patient tolerated treatment well Patient left: in chair;with call bell/phone within reach;with chair alarm set;with family/visitor present   Time: [id number] OT Time Calculation (min): 20 min Charges:  OT General Charges $OT Visit: 1 Procedure OT Evaluation $OT Eval Moderate Complexity: 1 Procedure OT Treatments $Self Care/Home Management : 8-22 mins G-Codes:    Eliezer BottomJamie L Stiller, OTR/L 02/28/2016, 12:23 PM

## 2016-02-29 ENCOUNTER — Encounter (HOSPITAL_COMMUNITY): Payer: Self-pay | Admitting: *Deleted

## 2016-02-29 ENCOUNTER — Encounter: Payer: Self-pay | Admitting: Physical Medicine and Rehabilitation

## 2016-02-29 ENCOUNTER — Inpatient Hospital Stay (HOSPITAL_COMMUNITY)
Admission: RE | Admit: 2016-02-29 | Discharge: 2016-03-26 | DRG: 057 | Disposition: A | Discharge status: Skilled Nursing Facility | Payer: Medicare Other | Source: Intra-hospital | Attending: Physical Medicine & Rehabilitation | Admitting: Physical Medicine & Rehabilitation

## 2016-02-29 DIAGNOSIS — B961 Klebsiella pneumoniae [K. pneumoniae] as the cause of diseases classified elsewhere: Secondary | ICD-10-CM | POA: Diagnosis present

## 2016-02-29 DIAGNOSIS — E46 Unspecified protein-calorie malnutrition: Secondary | ICD-10-CM | POA: Diagnosis present

## 2016-02-29 DIAGNOSIS — I69322 Dysarthria following cerebral infarction: Secondary | ICD-10-CM

## 2016-02-29 DIAGNOSIS — E8809 Other disorders of plasma-protein metabolism, not elsewhere classified: Secondary | ICD-10-CM

## 2016-02-29 DIAGNOSIS — N179 Acute kidney failure, unspecified: Secondary | ICD-10-CM | POA: Diagnosis present

## 2016-02-29 DIAGNOSIS — R29707 NIHSS score 7: Secondary | ICD-10-CM | POA: Diagnosis present

## 2016-02-29 DIAGNOSIS — R1310 Dysphagia, unspecified: Secondary | ICD-10-CM

## 2016-02-29 DIAGNOSIS — Z79899 Other long term (current) drug therapy: Secondary | ICD-10-CM | POA: Diagnosis not present

## 2016-02-29 DIAGNOSIS — E871 Hypo-osmolality and hyponatremia: Secondary | ICD-10-CM | POA: Diagnosis not present

## 2016-02-29 DIAGNOSIS — I69391 Dysphagia following cerebral infarction: Secondary | ICD-10-CM | POA: Diagnosis not present

## 2016-02-29 DIAGNOSIS — I10 Essential (primary) hypertension: Secondary | ICD-10-CM

## 2016-02-29 DIAGNOSIS — F419 Anxiety disorder, unspecified: Secondary | ICD-10-CM | POA: Diagnosis present

## 2016-02-29 DIAGNOSIS — I639 Cerebral infarction, unspecified: Secondary | ICD-10-CM | POA: Diagnosis present

## 2016-02-29 DIAGNOSIS — N183 Chronic kidney disease, stage 3 unspecified: Secondary | ICD-10-CM

## 2016-02-29 DIAGNOSIS — Z90710 Acquired absence of both cervix and uterus: Secondary | ICD-10-CM | POA: Diagnosis not present

## 2016-02-29 DIAGNOSIS — G8111 Spastic hemiplegia affecting right dominant side: Secondary | ICD-10-CM | POA: Diagnosis not present

## 2016-02-29 DIAGNOSIS — I129 Hypertensive chronic kidney disease with stage 1 through stage 4 chronic kidney disease, or unspecified chronic kidney disease: Secondary | ICD-10-CM | POA: Diagnosis present

## 2016-02-29 DIAGNOSIS — M792 Neuralgia and neuritis, unspecified: Secondary | ICD-10-CM | POA: Diagnosis not present

## 2016-02-29 DIAGNOSIS — I69351 Hemiplegia and hemiparesis following cerebral infarction affecting right dominant side: Secondary | ICD-10-CM | POA: Diagnosis present

## 2016-02-29 DIAGNOSIS — K219 Gastro-esophageal reflux disease without esophagitis: Secondary | ICD-10-CM | POA: Diagnosis present

## 2016-02-29 DIAGNOSIS — E870 Hyperosmolality and hypernatremia: Secondary | ICD-10-CM | POA: Diagnosis not present

## 2016-02-29 DIAGNOSIS — F4322 Adjustment disorder with anxiety: Secondary | ICD-10-CM | POA: Diagnosis present

## 2016-02-29 DIAGNOSIS — K5901 Slow transit constipation: Secondary | ICD-10-CM

## 2016-02-29 DIAGNOSIS — E876 Hypokalemia: Secondary | ICD-10-CM | POA: Diagnosis not present

## 2016-02-29 DIAGNOSIS — E785 Hyperlipidemia, unspecified: Secondary | ICD-10-CM | POA: Diagnosis present

## 2016-02-29 DIAGNOSIS — Z6820 Body mass index (BMI) 20.0-20.9, adult: Secondary | ICD-10-CM | POA: Diagnosis not present

## 2016-02-29 DIAGNOSIS — R1312 Dysphagia, oropharyngeal phase: Secondary | ICD-10-CM | POA: Diagnosis not present

## 2016-02-29 DIAGNOSIS — R7303 Prediabetes: Secondary | ICD-10-CM | POA: Diagnosis present

## 2016-02-29 DIAGNOSIS — D72829 Elevated white blood cell count, unspecified: Secondary | ICD-10-CM

## 2016-02-29 DIAGNOSIS — R0989 Other specified symptoms and signs involving the circulatory and respiratory systems: Secondary | ICD-10-CM | POA: Diagnosis not present

## 2016-02-29 DIAGNOSIS — IMO0002 Reserved for concepts with insufficient information to code with codable children: Secondary | ICD-10-CM

## 2016-02-29 DIAGNOSIS — N390 Urinary tract infection, site not specified: Secondary | ICD-10-CM

## 2016-02-29 DIAGNOSIS — R938 Abnormal findings on diagnostic imaging of other specified body structures: Secondary | ICD-10-CM | POA: Diagnosis not present

## 2016-02-29 LAB — TROPONIN I
TROPONIN I: 0.06 ng/mL — AB (ref <0.03)
TROPONIN I: 0.05 ng/mL — AB (ref <0.03)

## 2016-02-29 MED ORDER — BACLOFEN 5 MG HALF TABLET
5.0000 mg | ORAL_TABLET | Freq: Three times a day (TID) | ORAL | Status: DC | PRN
  Administered 2016-02-29: 5 mg via ORAL
  Filled 2016-02-29: qty 1

## 2016-02-29 MED ORDER — ALUM & MAG HYDROXIDE-SIMETH 200-200-20 MG/5ML PO SUSP: 30.0000 mL | ORAL | Status: DC | PRN

## 2016-02-29 MED ORDER — GUAIFENESIN-DM 100-10 MG/5ML PO SYRP: 5.0000 mL | ORAL_SOLUTION | Freq: Four times a day (QID) | ORAL | Status: DC | PRN

## 2016-02-29 MED ORDER — ENOXAPARIN SODIUM 40 MG/0.4ML ~~LOC~~ SOLN
40.0000 mg | SUBCUTANEOUS | Status: DC
  Administered 2016-02-29 – 2016-03-03 (×4): 40 mg via SUBCUTANEOUS
  Filled 2016-02-29 (×4): qty 0.4

## 2016-02-29 MED ORDER — FLEET ENEMA 7-19 GM/118ML RE ENEM: 1.0000 | ENEMA | Freq: Once | RECTAL | Status: DC | PRN

## 2016-02-29 MED ORDER — FLUOXETINE HCL 10 MG PO CAPS
10.0000 mg | ORAL_CAPSULE | Freq: Every day | ORAL | Status: DC
  Administered 2016-03-01 – 2016-03-20 (×20): 10 mg via ORAL
  Filled 2016-02-29 (×20): qty 1

## 2016-02-29 MED ORDER — SIMETHICONE 40 MG/0.6ML PO SUSP: 40.0000 mg | Freq: Four times a day (QID) | ORAL | 0 refills | Status: AC | PRN

## 2016-02-29 MED ORDER — PANTOPRAZOLE SODIUM 40 MG PO TBEC
40.0000 mg | DELAYED_RELEASE_TABLET | Freq: Every day | ORAL | Status: DC
  Filled 2016-02-29: qty 1

## 2016-02-29 MED ORDER — ASPIRIN EC 325 MG PO TBEC: 325.0000 mg | DELAYED_RELEASE_TABLET | Freq: Every day | ORAL | 0 refills | Status: DC

## 2016-02-29 MED ORDER — METOPROLOL TARTRATE 25 MG PO TABS
25.0000 mg | ORAL_TABLET | Freq: Two times a day (BID) | ORAL | Status: DC
  Administered 2016-02-29 – 2016-03-26 (×50): 25 mg via ORAL
  Filled 2016-02-29 (×17): qty 1
  Filled 2016-02-29: qty 2
  Filled 2016-02-29 (×36): qty 1

## 2016-02-29 MED ORDER — LISINOPRIL 40 MG PO TABS
40.0000 mg | ORAL_TABLET | Freq: Every day | ORAL | Status: DC
  Administered 2016-03-02 – 2016-03-16 (×15): 40 mg via ORAL
  Filled 2016-02-29 (×17): qty 1

## 2016-02-29 MED ORDER — TRAZODONE HCL 50 MG PO TABS
25.0000 mg | ORAL_TABLET | Freq: Every evening | ORAL | Status: DC | PRN
  Administered 2016-03-02: 50 mg via ORAL
  Filled 2016-02-29: qty 1

## 2016-02-29 MED ORDER — ASPIRIN 81 MG PO CHEW
81.0000 mg | CHEWABLE_TABLET | Freq: Every day | ORAL | Status: DC
  Administered 2016-03-01 – 2016-03-26 (×26): 81 mg via ORAL
  Filled 2016-02-29 (×26): qty 1

## 2016-02-29 MED ORDER — METOPROLOL TARTRATE 25 MG PO TABS
25.0000 mg | ORAL_TABLET | Freq: Once | ORAL | Status: AC
  Administered 2016-02-29: 11:00:00 25 mg via ORAL

## 2016-02-29 MED ORDER — ATORVASTATIN CALCIUM 40 MG PO TABS: 40.0000 mg | ORAL_TABLET | Freq: Every day | ORAL | 0 refills | Status: AC

## 2016-02-29 MED ORDER — SENNOSIDES-DOCUSATE SODIUM 8.6-50 MG PO TABS
2.0000 | ORAL_TABLET | Freq: Every day | ORAL | Status: DC
  Filled 2016-02-29: qty 2

## 2016-02-29 MED ORDER — BISACODYL 10 MG RE SUPP: 10.0000 mg | Freq: Every day | RECTAL | Status: DC | PRN

## 2016-02-29 MED ORDER — PROCHLORPERAZINE EDISYLATE 5 MG/ML IJ SOLN
5.0000 mg | Freq: Four times a day (QID) | INTRAMUSCULAR | Status: DC | PRN
  Administered 2016-03-12: 5 mg via INTRAMUSCULAR
  Filled 2016-02-29: qty 2

## 2016-02-29 MED ORDER — ORAL CARE MOUTH RINSE
15.0000 mL | Freq: Two times a day (BID) | OROMUCOSAL | Status: DC
  Administered 2016-02-29 – 2016-03-26 (×44): 15 mL via OROMUCOSAL

## 2016-02-29 MED ORDER — LEVOTHYROXINE SODIUM 75 MCG PO TABS
75.0000 ug | ORAL_TABLET | Freq: Every day | ORAL | Status: DC
  Administered 2016-03-01 – 2016-03-26 (×26): 75 ug via ORAL
  Filled 2016-02-29 (×26): qty 1

## 2016-02-29 MED ORDER — AMLODIPINE BESYLATE 5 MG PO TABS
5.0000 mg | ORAL_TABLET | Freq: Every day | ORAL | Status: DC
  Administered 2016-03-02: 5 mg via ORAL
  Filled 2016-02-29 (×2): qty 1

## 2016-02-29 MED ORDER — DIPHENHYDRAMINE HCL 12.5 MG/5ML PO ELIX
12.5000 mg | ORAL_SOLUTION | Freq: Four times a day (QID) | ORAL | Status: DC | PRN
  Filled 2016-02-29: qty 10

## 2016-02-29 MED ORDER — VITAMIN D 1000 UNITS PO TABS
500.0000 [IU] | ORAL_TABLET | Freq: Every day | ORAL | Status: DC
Start: 2016-03-01 — End: 2016-03-26
  Administered 2016-03-01 – 2016-03-26 (×25): 500 [IU] via ORAL
  Filled 2016-02-29 (×25): qty 1

## 2016-02-29 MED ORDER — METOPROLOL TARTRATE 25 MG PO TABS: 50.0000 mg | ORAL_TABLET | Freq: Two times a day (BID) | ORAL | 0 refills | Status: DC

## 2016-02-29 MED ORDER — ADULT MULTIVITAMIN W/MINERALS CH
1.0000 | ORAL_TABLET | Freq: Every day | ORAL | Status: DC
  Administered 2016-03-01 – 2016-03-04 (×4): 1 via ORAL
  Filled 2016-02-29 (×5): qty 1

## 2016-02-29 MED ORDER — PROCHLORPERAZINE 25 MG RE SUPP: 12.5000 mg | Freq: Four times a day (QID) | RECTAL | Status: DC | PRN

## 2016-02-29 MED ORDER — SIMETHICONE 40 MG/0.6ML PO SUSP
40.0000 mg | Freq: Four times a day (QID) | ORAL | Status: DC | PRN
  Filled 2016-02-29: qty 0.6

## 2016-02-29 MED ORDER — BISACODYL 10 MG RE SUPP
10.0000 mg | Freq: Every day | RECTAL | Status: DC
  Administered 2016-02-29 – 2016-03-16 (×3): 10 mg via RECTAL
  Filled 2016-02-29 (×7): qty 1

## 2016-02-29 MED ORDER — ATORVASTATIN CALCIUM 40 MG PO TABS
40.0000 mg | ORAL_TABLET | Freq: Every day | ORAL | Status: DC
  Administered 2016-02-29 – 2016-03-25 (×26): 40 mg via ORAL
  Filled 2016-02-29 (×26): qty 1

## 2016-02-29 MED ORDER — PROCHLORPERAZINE MALEATE 5 MG PO TABS: 5.0000 mg | ORAL_TABLET | Freq: Four times a day (QID) | ORAL | Status: DC | PRN

## 2016-02-29 MED ORDER — ACETAMINOPHEN 325 MG PO TABS
325.0000 mg | ORAL_TABLET | ORAL | Status: DC | PRN
  Administered 2016-03-01 – 2016-03-18 (×11): 650 mg via ORAL
  Administered 2016-03-19: 325 mg via ORAL
  Administered 2016-03-19 – 2016-03-25 (×10): 650 mg via ORAL
  Filled 2016-02-29 (×6): qty 2
  Filled 2016-02-29: qty 1
  Filled 2016-02-29 (×17): qty 2

## 2016-02-29 NOTE — Discharge Instructions (Signed)
Continue inpatient rehabilitation Follow up with primary care physician in one to 2 weeks Follow-up with neurology Dr. Sherryll BurgerSHAH in a month Cardiac diet pure diet with nectar thick liquids

## 2016-02-29 NOTE — Discharge Summary (Addendum)
[facility] [place] at [facility]   PATIENT NAME: Christina Bradshaw    MR#:  [id number]  DATE OF BIRTH:  [birth year]  DATE OF ADMISSION:  02/26/2016 ADMITTING PHYSICIAN: Tonye Royalty, DO  DATE OF DISCHARGE: 02/29/16 PRIMARY CARE PHYSICIAN: Dorothey Baseman, MD    ADMISSION DIAGNOSIS:  CVA (cerebral vascular accident) (HCC) [I63.9] Cerebrovascular accident (CVA), unspecified mechanism (HCC) [I63.9]  DISCHARGE DIAGNOSIS:  Acute CVA  SECONDARY DIAGNOSIS:   Past Medical History:  Diagnosis Date  . Hypertension   . Stroke [facility])     HOSPITAL COURSE:   This is a 83 y.o.femalewith a history of hypertension, vertigo, hyperlipidemia, GERD, osteoporosis, overactive bladder, hypothyroidismnow being admitted with:  1. AcuteCVA - Patient has persistent right facial droop and right upper extremity weakness for greater than 24 hours. - CT brain was negative for any acute infarct. - MRI of the brain shows an acute basal ganglia infarct.  - Carotid dopplers show no evidence of hemodynamically significant stenosis. Echocardiogram wnl - ASA 325 mg & High intensity Statin - PT,OT,ST - recommends CIR  2. Hypertension- Blood pressure is elevated after 24 hours   Resume Norvasc, lisinopril and metoprolol . Increase the metoprolol dose to 50 mg by mouth twice a day  initially held to let BP autoregulate BP in acute CVA phase - 3. Elevated troponins: likely due to supply demand ischemia, doubt MI, will continue to trend to r/o mi, on asa. 4. GERD-continue Prilosec 5. Hyperlipdemia: High intensity statin 6. History of osteoporosis-continue vitamin D. 7. Hypothyroidism-continue Synthroid  DISCHARGE CONDITIONS:   fair  CONSULTS OBTAINED:  Treatment Team:  Kym Groom, MD   PROCEDURES  none  DRUG ALLERGIES:  No Known Allergies  DISCHARGE MEDICATIONS:   Current Discharge Medication List    START taking these medications   Details  aspirin  EC 325 MG tablet Take 1 tablet (325 mg total) by mouth daily. Qty: 30 tablet, Refills: 0    atorvastatin (LIPITOR) 40 MG tablet Take 1 tablet (40 mg total) by mouth daily at 6 PM. Qty: 30 tablet, Refills: 0    simethicone (MYLICON) 40 MG/0.6ML drops Take 0.6 mLs (40 mg total) by mouth every 6 (six) hours as needed for flatulence. Qty: 30 mL, Refills: 0      CONTINUE these medications which have CHANGED   Details  metoprolol tartrate (LOPRESSOR) 25 MG tablet Take 2 tablets (50 mg total) by mouth 2 (two) times daily. Qty: 60 tablet, Refills: 0      CONTINUE these medications which have NOT CHANGED   Details  amLODipine (NORVASC) 5 MG tablet Take 5 mg by mouth daily.    docusate sodium (COLACE) 100 MG capsule Take 100 mg by mouth at bedtime.    levothyroxine (SYNTHROID, LEVOTHROID) 75 MCG tablet Take 75 mcg by mouth daily before breakfast.    lisinopril (PRINIVIL,ZESTRIL) 40 MG tablet Take 40 mg by mouth daily.    Multiple Vitamin (MULTIVITAMIN WITH MINERALS) TABS tablet Take 1 tablet by mouth daily.    omeprazole (PRILOSEC) 20 MG capsule Take 20 mg by mouth daily.    Vitamin D, Cholecalciferol, 400 UNITS CAPS Take 400 Units by mouth daily.    Influenza Virus Vaccine Split (FLUZONE IM) Inject 1 Dose into the muscle once.         DISCHARGE INSTRUCTIONS:  Follow-up with primary care physician in a week after discharge  Continue inpatient rehabilitation Follow up with primary care physician in one to 2 weeks Follow-up with neurology  Dr. Sherryll Burger in a month Cardiac diet pure diet with nectar thick liquids   DIET:  Cardiac diet  DISCHARGE CONDITION:  Stable  ACTIVITY:  Activity as tolerated per PT  OXYGEN:  Home Oxygen: No.   Oxygen Delivery: room air  DISCHARGE LOCATION:  Inpatient rehabilitation   If you experience worsening of your admission symptoms, develop shortness of breath, life threatening emergency, suicidal or homicidal thoughts you must seek medical  attention immediately by calling 911 or calling your MD immediately  if symptoms less severe.  You Must read complete instructions/literature along with all the possible adverse reactions/side effects for all the Medicines you take and that have been prescribed to you. Take any new Medicines after you have completely understood and accpet all the possible adverse reactions/side effects.   Please note  You were cared for by a hospitalist during your hospital stay. If you have any questions about your discharge medications or the care you received while you were in the hospital after you are discharged, you can call the unit and asked to speak with the hospitalist on call if the hospitalist that took care of you is not available. Once you are discharged, your primary care physician will handle any further medical issues. Please note that NO REFILLS for any discharge medications will be authorized once you are discharged, as it is imperative that you return to your primary care physician (or establish a relationship with a primary care physician if you do not have one) for your aftercare needs so that they can reassess your need for medications and monitor your lab values.     Today  Chief Complaint  Patient presents with  . Weakness   Patient is tolerating pured diet with nectar thick liquids. Still feeling weak on the right side. Son and daughter are at the bedside  ROS:  CONSTITUTIONAL: Denies fevers, chills. Denies any fatigue, weakness.  EYES: Denies blurry vision, double vision, eye pain. EARS, NOSE, THROAT: Denies tinnitus, ear pain, hearing loss. RESPIRATORY: Denies cough, wheeze, shortness of breath.  CARDIOVASCULAR: Denies chest pain, palpitations, edema.  GASTROINTESTINAL: Denies nausea, vomiting, diarrhea, abdominal pain. Denies bright red blood per rectum. GENITOURINARY: Denies dysuria, hematuria. ENDOCRINE: Denies nocturia or thyroid problems. HEMATOLOGIC AND LYMPHATIC: Denies  easy bruising or bleeding. SKIN: Denies rash or lesion. MUSCULOSKELETAL: Denies pain in neck, back, shoulder, knees, hips or arthritic symptoms.  NEUROLOGIC: Reporting right-sided weakness PSYCHIATRIC: Denies anxiety or depressive symptoms.   VITAL SIGNS:  Blood pressure (!) 160/69, pulse 81, temperature 98.2 F (36.8 C), temperature source Oral, resp. rate 18, height 5\' 7"  (1.702 m), weight 66.5 kg (146 lb 9.7 oz), SpO2 98 %.  I/O:    Intake/Output Summary (Last 24 hours) at 02/29/16 1110 Last data filed at 02/29/16 0534  Gross per 24 hour  Intake             2234 ml  Output                0 ml  Net             2234 ml    PHYSICAL EXAMINATION:  GENERAL:  83 y.o.-year-old patient lying in the bed with no acute distress.  EYES: Pupils equal, round, reactive to light and accommodation. No scleral icterus. Extraocular muscles intact.  HEENT: Head atraumatic, normocephalic. Oropharynx and nasopharynx clear.  NECK:  Supple, no jugular venous distention. No thyroid enlargement, no tenderness.  LUNGS: Normal breath sounds bilaterally, no wheezing, rales,rhonchi or crepitation. No  use of accessory muscles of respiration.  CARDIOVASCULAR: S1, S2 normal. No murmurs, rubs, or gallops.  ABDOMEN: Soft, non-tender, non-distended. Bowel sounds present. No organomegaly or mass.  EXTREMITIES: No pedal edema, cyanosis, or clubbing.  NEUROLOGIC: Right-sided upper and lower motor 3 out of 5. Left upper and lower extremity 5 out of 5. Deviated angle of mouth to the left with right facial droop Sensory touch and pain stimuli are intact bilaterally. Gait not checked.  PSYCHIATRIC: The patient is alert and oriented x 3.  SKIN: No obvious rash, lesion, or ulcer.   DATA REVIEW:   CBC  Recent Labs Lab 02/27/16 0341  WBC 10.3  HGB 14.5  HCT 42.5  PLT 203    Chemistries   Recent Labs Lab 02/26/16 1610 02/27/16 0341  NA 137  --   K 4.4  --   CL 103  --   CO2 25  --   GLUCOSE 134*  --    BUN 19  --   CREATININE 0.95 1.04*  CALCIUM 9.8  --     Cardiac Enzymes  Recent Labs Lab 02/29/16 0905  TROPONINI 0.05*    Microbiology Results  Results for orders placed or performed during the hospital encounter of 02/26/16  MRSA PCR Screening     Status: None   Collection Time: 02/27/16 12:05 PM  Result Value Ref Range Status   MRSA by PCR NEGATIVE NEGATIVE Final    Comment:        The GeneXpert MRSA Assay (FDA approved for NASAL specimens only), is one component of a comprehensive MRSA colonization surveillance program. It is not intended to diagnose MRSA infection nor to guide or monitor treatment for MRSA infections.     RADIOLOGY:  Dg Chest 2 View  Result Date: 02/26/2016 CLINICAL DATA:  83 y/o  F; dizziness and weakness. EXAM: CHEST  2 VIEW COMPARISON:  07/10/2013 chest radiograph FINDINGS: Stable cardiac silhouette within normal limits given projection and technique. Aortic atherosclerosis with calcification. No focal consolidation of the lungs. Mild biapical pleuroparenchymal scarring. Multilevel degenerative changes of the thoracic spine. Expanded lungs with flattened diaphragms compatible with COPD. IMPRESSION: No active cardiopulmonary disease. Aortic atherosclerosis. Findings of COPD. Electronically Signed   By: Mitzi Hansen M.D.   On: 02/26/2016 20:58   Ct Head Wo Contrast  Result Date: 02/26/2016 CLINICAL DATA:  Acute onset of generalized weakness and slurred speech. Dizziness. Initial encounter. EXAM: CT HEAD WITHOUT CONTRAST TECHNIQUE: Contiguous axial images were obtained from the base of the skull through the vertex without intravenous contrast. COMPARISON:  CT of the head performed 10/25/2014 FINDINGS: Brain: No evidence of acute infarction, hemorrhage, hydrocephalus, extra-axial collection or mass lesion/mass effect. Prominence of the ventricles and sulci reflects mild to moderate cortical volume loss. Scattered periventricular and subcortical  white matter change likely reflects small vessel ischemic microangiopathy. Cerebellar atrophy is noted. The brainstem and fourth ventricle are within normal limits. The basal ganglia are unremarkable in appearance. The cerebral hemispheres demonstrate grossly normal gray-white differentiation. No mass effect or midline shift is seen. Vascular: No hyperdense vessel or unexpected calcification. Skull: There is no evidence of fracture; visualized osseous structures are unremarkable in appearance. Sinuses/Orbits: The orbits are within normal limits. The paranasal sinuses and mastoid air cells are well-aerated. Other: No significant soft tissue abnormalities are seen. IMPRESSION: 1. No acute intracranial pathology seen on CT. 2. Mild to moderate cortical volume loss and scattered small vessel ischemic microangiopathy. Electronically Signed   By: Beryle Beams.D.  On: 02/26/2016 20:49   Mr Brain Wo Contrast  Result Date: 02/27/2016 CLINICAL DATA:  Stroke EXAM: MRI HEAD WITHOUT CONTRAST MRA HEAD WITHOUT CONTRAST TECHNIQUE: Multiplanar, multiecho pulse sequences of the brain and surrounding structures were obtained without intravenous contrast. Angiographic images of the head were obtained using MRA technique without contrast. COMPARISON:  CT 02/26/2016 FINDINGS: MRI HEAD FINDINGS Brain: Acute infarct left corona radiata extending into the putamen. Small area of acute infarct body of the caudate on the left. No other areas of acute infarct. Mild generalized atrophy. Moderate chronic microvascular ischemic changes in the white matter and pons. Negative for hemorrhage or mass. No hydrocephalus or shift of the midline structures. Skull and upper cervical spine: Negative Sinuses/Orbits: Mild mucosal edema in the paranasal sinuses. Bilateral lens replacement. Other: None MRA HEAD FINDINGS Distal right vertebral artery patent to the basilar. Left vertebral artery non dominant and patent to the basilar. Basilar widely  patent. Right PICA patent. Left PICA not visualized. AICA, superior cerebellar arteries patent bilaterally. Fetal origin of the posterior cerebral artery bilaterally with hypoplastic distal basilar. Internal carotid artery patent bilaterally without significant stenosis. Hypoplastic left A1 segment. Both anterior cerebral arteries supplied from the right. Middle cerebral arteries patent bilaterally. Negative for cerebral aneurysm. IMPRESSION: Acute infarct in the deep white matter on the left extending into the caudate and putamen. Atrophy with moderate chronic microvascular ischemia No significant intracranial stenosis.  No large vessel occlusion. Electronically Signed   By: Marlan Palauharles  Clark M.D.   On: 02/27/2016 11:01   [place] Carotid Bilateral (at [facility] And [facility] Only)  Result Date: 02/27/2016 CLINICAL DATA:  Stroke. History of hypertension, syncope, visual disturbance, hyperlipidemia. EXAM: BILATERAL CAROTID DUPLEX ULTRASOUND TECHNIQUE: Wallace CullensGray scale imaging, color Doppler and duplex ultrasound were performed of bilateral carotid and vertebral arteries in the neck. COMPARISON:  None. FINDINGS: Criteria: Quantification of carotid stenosis is based on velocity parameters that correlate the residual internal carotid diameter with NASCET-based stenosis levels, using the diameter of the distal internal carotid lumen as the denominator for stenosis measurement. The following velocity measurements were obtained: RIGHT ICA:  126/19 cm/sec CCA:  77/12 cm/sec SYSTOLIC ICA/CCA RATIO:  1.6 DIASTOLIC ICA/CCA RATIO:  1.6 ECA:  93 cm/sec LEFT ICA:  109/24 cm/sec CCA:  94/14 cm/sec SYSTOLIC ICA/CCA RATIO:  1.2 DIASTOLIC ICA/CCA RATIO:  1.6 ECA:  152 cm/sec RIGHT CAROTID ARTERY: Minimal atherosclerotic change in the carotid bulb without significant stenosis identified. Normal homogeneous flow without significant turbulence. RIGHT VERTEBRAL ARTERY:  Antegrade flow direction is demonstrated. LEFT CAROTID ARTERY: Mild areas of calcific  atherosclerotic change in the carotid bulb. No significant stenosis identified. Normal homogeneous flow without significant turbulence. LEFT VERTEBRAL ARTERY:  Antegrade flow direction is demonstrated. IMPRESSION: No evidence of hemodynamically significant stenosis in either right or the left internal carotid artery. Nonocclusive calcific plaque formation in the left carotid bulb. Electronically Signed   By: Burman NievesWilliam  Stevens M.D.   On: 02/27/2016 06:42   Mr Maxine GlennMra Head/brain NFWo Cm  Result Date: 02/27/2016 CLINICAL DATA:  Stroke EXAM: MRI HEAD WITHOUT CONTRAST MRA HEAD WITHOUT CONTRAST TECHNIQUE: Multiplanar, multiecho pulse sequences of the brain and surrounding structures were obtained without intravenous contrast. Angiographic images of the head were obtained using MRA technique without contrast. COMPARISON:  CT 02/26/2016 FINDINGS: MRI HEAD FINDINGS Brain: Acute infarct left corona radiata extending into the putamen. Small area of acute infarct body of the caudate on the left. No other areas of acute infarct. Mild generalized atrophy. Moderate chronic microvascular ischemic changes  in the white matter and pons. Negative for hemorrhage or mass. No hydrocephalus or shift of the midline structures. Skull and upper cervical spine: Negative Sinuses/Orbits: Mild mucosal edema in the paranasal sinuses. Bilateral lens replacement. Other: None MRA HEAD FINDINGS Distal right vertebral artery patent to the basilar. Left vertebral artery non dominant and patent to the basilar. Basilar widely patent. Right PICA patent. Left PICA not visualized. AICA, superior cerebellar arteries patent bilaterally. Fetal origin of the posterior cerebral artery bilaterally with hypoplastic distal basilar. Internal carotid artery patent bilaterally without significant stenosis. Hypoplastic left A1 segment. Both anterior cerebral arteries supplied from the right. Middle cerebral arteries patent bilaterally. Negative for cerebral aneurysm.  IMPRESSION: Acute infarct in the deep white matter on the left extending into the caudate and putamen. Atrophy with moderate chronic microvascular ischemia No significant intracranial stenosis.  No large vessel occlusion. Electronically Signed   By: Marlan Palau M.D.   On: 02/27/2016 11:01    EKG:   Orders placed or performed during the hospital encounter of 02/26/16  . ED EKG  . ED EKG      Management plans discussed with the patient, family and they are in agreement.  CODE STATUS:     Code Status Orders        Start     Ordered   02/27/16 1534  Do not attempt resuscitation (DNR)  Continuous    Question Answer Comment  In the event of cardiac or respiratory ARREST Do not call a "code blue"   In the event of cardiac or respiratory ARREST Do not perform Intubation, CPR, defibrillation or ACLS   In the event of cardiac or respiratory ARREST Use medication by any route, position, wound care, and other measures to relive pain and suffering. May use oxygen, suction and manual treatment of airway obstruction as needed for comfort.      02/27/16 1533    Code Status History    Date Active Date Inactive Code Status Order ID Comments User Context   02/27/2016  3:23 AM 02/27/2016  3:33 PM Full Code [id number]  Tonye Royalty, DO ED   02/27/2016  1:33 AM 02/27/2016  3:23 AM DNR [id number]  Tonye Royalty, DO ED    Advance Directive Documentation   Flowsheet Row Most Recent Value  Type of Advance Directive  Living will [DNR]  Pre-existing out of facility DNR order (yellow form or pink MOST form)  No data  "MOST" Form in Place?  No data      TOTAL TIME TAKING CARE OF THIS PATIENT: 45  minutes.   Note: This dictation was prepared with Dragon dictation along with smaller phrase technology. Any transcriptional errors that result from this process are unintentional.   @MEC @  on 02/29/2016 at 11:10 AM  Between 7am to 6pm - Pager - [phone]  After 6pm go to [email or web address] -  password [email or web address] [facility]  [place] [facility]  Office  [phone]  CC: Primary care physician; Dorothey Baseman, MD

## 2016-02-29 NOTE — Progress Notes (Signed)
Patient ID: Christina Bradshaw, female   DOB: [birth year], 83 y.o.   MRN: [id number] Received report from Marchelle FolksAmanda, RN at [facility] Patient arrived via [facility]. Patient and family oriented to rehab process, rehab schedule, fall prevention plan, rehab safety plan, health resource notebook, and nurse call system. Patient resting comfortably in bed with family in room.

## 2016-02-29 NOTE — Plan of Care (Signed)
Problem: Education: Goal: Knowledge of [facility] Education information/materials will improve Outcome: Progressing Hypertensive during shift, but WDL.  VSS otherwise, free of falls during shift.  Reports leg pain 5-9/10, improved w/ PRN Tylenol 650mg  x2.  Reported gas, Dr. Anne HahnWillis paged, received PRN Simethicone 40mg .  Received PRN Senna-Docusate for constipation.  No other complaints overnight, son at bedside.  Bed in low position, bed alarm on.  Call bell within reach, WCTM.

## 2016-02-29 NOTE — Care Management (Signed)
Received telephone call from Roderic PalauGenie Logue RN [facility] Will be able to accept Ms. Romberg into their facility today. Will be going to [address]  Dr. Adelene AmasZachary Schartz will be the accepting physician. Dr. Amado CoeGouru update.  Transportation will be  arranged per [facility] Gwenette GreetBrenda S Valentina Alcoser RN MSN CCM Care Management

## 2016-02-29 NOTE — Progress Notes (Signed)
[facility] [place], [facility] called for [facility], family at bedside

## 2016-02-29 NOTE — Care Management Important Message (Signed)
Important Message  Patient Details  Name: Christina Bradshaw MRN: [id number] Date of Birth: 1924/03/26   Medicare Important Message Given:  Yes    Gwenette GreetBrenda S Zachari Alberta, RN 02/29/2016, 8:28 AM

## 2016-02-29 NOTE — H&P (Addendum)
Physical Medicine and Rehabilitation Admission H&P     Chief Complaint  Patient presents with  . Right sided weakness, slurred speech and dysphagia  HPI: Christina Bradshaw is an 83 y.o. female with history of HTN, palpitations, who was admitted on 02/26/16 with compliants of dizziness and speech changes since awakening from sleep. Her symptoms did not improve and [facility]. NIHSS 7 and MRI brain done revealing acute infarct left corona radiata extending into putamen and left caudate. MRA negative for stenosis/large vessel occlusion. Carotid dopplers with non-occlusive calcific plaque formation in left carotid bulb and no significant ICA stenosis. 2D echo with EF 60-65% with mild LVH and calcified mitral annulus. She did worsening of weakness overnight with dense right hemiparesis. Dr. Thad Rangereynolds recommends ASA and statin for stroke due to small vessel disease. Cardiac enzymes being monitored and elevation felt to be due to demand ischemia and doubt MI per reports. BSS done revealing signs of aspiration with thin and placed on dysphagia 1, nectar liquids. Therapy evaluations done revealing decreased balance with posterior lean and RLE instability. CIR recommended for follow up therapy.  Review of Systems  HENT: Negative for hearing loss and tinnitus.  Eyes: Negative for blurred vision and double vision.  Respiratory: Negative for cough and shortness of breath.  Cardiovascular: Positive for palpitations.  Gastrointestinal: Positive for constipation (chronic issues). Negative for heartburn.  Genitourinary: Negative for dysuria and urgency.  Musculoskeletal: Negative for back pain, joint pain and myalgias.  Skin: Negative for itching and rash.  Neurological: Positive for speech change and focal weakness. Negative for dizziness and headaches.  Psychiatric/Behavioral: The patient is nervous/anxious. The patient does not have insomnia.       Past Medical History:  Diagnosis Date  .  Hypertension   . Stroke [facility](HCC)         Past Surgical History:  Procedure Laterality Date  . ABDOMINAL HYSTERECTOMY     History reviewed. No pertinent family history.  Social History: Widowed. Lives alone and independent PTA. Does not drive. reports that she has never smoked. She has never used smokeless tobacco. She reports that she does not drink alcohol or use drugs.  Allergies: No Known Allergies        Medications Prior to Admission  Medication Sig Dispense Refill  . amLODipine (NORVASC) 5 MG tablet Take 5 mg by mouth daily.    Marland Kitchen. docusate sodium (COLACE) 100 MG capsule Take 100 mg by mouth at bedtime.    Marland Kitchen. levothyroxine (SYNTHROID, LEVOTHROID) 75 MCG tablet Take 75 mcg by mouth daily before breakfast.    . lisinopril (PRINIVIL,ZESTRIL) 40 MG tablet Take 40 mg by mouth daily.    . metoprolol tartrate (LOPRESSOR) 25 MG tablet Take 25 mg by mouth 2 (two) times daily.    . Multiple Vitamin (MULTIVITAMIN WITH MINERALS) TABS tablet Take 1 tablet by mouth daily.    Marland Kitchen. omeprazole (PRILOSEC) 20 MG capsule Take 20 mg by mouth daily.    . Vitamin D, Cholecalciferol, 400 UNITS CAPS Take 400 Units by mouth daily.    . Influenza Virus Vaccine Split (FLUZONE IM) Inject 1 Dose into the muscle once.     Home:  Home Living  Family/patient expects to be discharged to:: Private residence  Living Arrangements: Alone  Available Help at Discharge: Family, Available PRN/intermittently  Type of Home: House  Home Access: Stairs to enter  Entergy CorporationEntrance Stairs-Number of Steps: 5 STE  Entrance Stairs-Rails: Left  Home Layout: Two level, Able  to live on main level with bedroom/bathroom  Bathroom Shower/Tub: Medical sales representative: Standard  Bathroom Accessibility: Yes  Home Equipment: Cane - single point, Cane - quad  Functional History:  Prior Function  Level of Independence: Independent with assistive device(s)  Comments: Pt independent (except does not drive) and occasionally uses SPC outside on  uneven ground. Pt denies any falls in past 6 months.  Functional Status:  Mobility:  Bed Mobility  Overal bed mobility: Needs Assistance  Bed Mobility: Supine to Sit  Supine to sit: Mod assist, HOB elevated  Sit to supine: Max assist, HOB elevated  General bed mobility comments: assist for R LE and trunk supine to/from sit; vc's for technique required  Transfers  Overall transfer level: Needs assistance  Equipment used: Hemi-walker  Transfers: Sit to/from Stand, [employer] Transfers  Sit to Stand: Mod assist  Stand pivot transfers: Mod assist, +2 physical assistance  General transfer comment: able to stand and transfer with HW and RUE supported in sling/belt. R knee hyperextends during standing/stepping  Ambulation/Gait  Ambulation/Gait assistance: Mod assist, +2 physical assistance  Ambulation Distance (Feet): 3 Feet  Assistive device: Hemi-walker  Gait Pattern/deviations: Step-to pattern, Trunk flexed  General Gait Details: limited ambulation   ADL:   Cognition:  Cognition  Overall Cognitive Status: Within Functional Limits for tasks assessed  Orientation Level: Oriented X4  Cognition  Arousal/Alertness: Awake/alert  Behavior During Therapy: WFL for tasks assessed/performed  Overall Cognitive Status: Within Functional Limits for tasks assessed  Blood pressure (!) 174/67, pulse 96, temperature 97.8 F (36.6 C), temperature source Oral, resp. rate 20, height 5\' 7"  (1.702 m), weight 66.5 kg (146 lb 9.7 oz), SpO2 96 %.  Physical Exam  Nursing note and vitals reviewed.  Constitutional: She is oriented to person, place, and time. She appears well-developed and well-nourished.  Fatigued appearing elderly female with soft voice.  HENT:  Head: Normocephalic and atraumatic.  Eyes: Conjunctivae and EOM are normal. Pupils are equal, round, and reactive to light.  Neck: Normal range of motion. Neck supple.  Cardiovascular: Normal rate and regular rhythm.  Murmur heard.    Respiratory: No stridor. She is in respiratory distress. She has no wheezes.  GI: Soft. Bowel sounds are normal. She exhibits no distension. There is no tenderness. There is no rebound.  Musculoskeletal: She exhibits no edema.  Neurological: She is alert and oriented to person, place, and time. A cranial nerve deficit is present.  Right facial weakness with moderate to severe dysarthria and tongue with deviation to right. Soft voice. Able to follow basic commands and answer biographic information without difficulty. Reasonable insight and awareness. Dense right hemiparesis with extensor tone RLE and flexor tone RUE. MAS tr -1 KE,PF and tr-1 right pecs, tr biceps. DTR's 3+ RUE and RLE and 2+ LUE and LLE. Motor: RUE- 0/5 prox to distal. RLE: 1/5 HF, KE and tr ADF/PF. Senses touch fairly equally right to left.  Skin: Skin is warm and dry.  Psychiatric: She has a normal mood and affect. Her behavior is normal.   Lab Results Last 48 Hours  Imaging  Results (Last 48 hours)     Medical Problem List and Plan:  1. Right hemiparesis and dysarthria/dysphagia secondary to left corona radiata,putamen,caudate infarct  2. DVT Prophylaxis/Anticoagulation: Pharmaceutical: Lovenox  3. Pain Management: tylenol prn  4. Mood: Reporting some anxiety and fear--wanted to go ahead and start medication. Will start prozac to help with mood and recovery.  Has history of anxiety--monitor for now. LCSW to follow for evaluation and support.  5. Neuropsych: This patient is capable of making decisions on her own behalf.  6. Skin/Wound Care: Routine pressure relief measures. Maintain adequate nutritional and hydration status.  7. Fluids/Electrolytes/Nutrition: Monitor I/O. Supplements between meals. Recheck lytes in am.  8. HTN: Monitor BP bid--avoid hypotension to allow for adequate perfusion. On Norvasc, lisinopril and metoprolol bid.  9. Dysphagia: Continue dysphagia 1, nectar liquids. Offer fluids between meals to maintain adequate hydration.  10. Dyslipidemia: on lipitor  11. Prediabetes: Hgb A1c- 6.0--has been stable. Will modify diet to HH/CM. RD to educate patient and family on appropriate diet.  12. GERD: Managed On Protonix.  13. Chronic constipation: Uses softners as well as suppository daily. Will start senna S at supper followed by suppository in am.  14. Emerging right spastic hemiparesis: Will order baclofen prn for now. Resting hand splint and R-AFO. ROM with therapy.  -reviewed stretching,positioning with family  Post Admission Physician Evaluation:  1. Functional deficits secondary to left subcortical/deep cortical infarct. 2. Patient is admitted to receive collaborative, interdisciplinary care between the physiatrist, rehab nursing staff, and therapy team. 3. Patient's level of medical complexity and substantial therapy needs in context of that medical necessity cannot be provided at a lesser intensity of care such as a SNF. 4. Patient has  experienced substantial functional loss from his/her baseline which was documented above under the "Functional History" and "Functional Status" headings. Judging by the patient's diagnosis, physical exam, and functional history, the patient has potential for functional progress which will result in measurable gains while on inpatient rehab. These gains will be of substantial and practical use upon discharge in facilitating mobility and self-care at the household level. 5. Physiatrist will provide 24 hour management of medical needs as well as oversight of the therapy plan/treatment and provide guidance as appropriate regarding the interaction of the two. 6. The Preadmission Screening has been reviewed and patient status is unchanged unless otherwise stated above. 7. 24 hour rehab nursing will assist with bladder management, bowel management, safety, skin/wound care, disease management, medication administration and patient education and help integrate therapy concepts, techniques,education, etc. 8. PT will assess and treat for/with: Lower extremity strength, range of motion, stamina, balance, functional mobility, safety, adaptive techniques and equipment, NMR, spasticity mgt, w/c use, stroke education, community re-entry. Goals are: min to mod assist 9. OT will assess and treat for/with: Lower extremity strength, range of motion, stamina, balance, functional mobility, safety, adaptive techniques and equipment, NMR, spastiicty mgt, orthotics, family education, ego support. Goals are: min assist. Therapy may proceed with showering this patient. 10. SLP will assess and treat for/with: communication, swallowing, education. Goals are: mod I to supervision. 11. Case Management and Social Worker will assess and treat for psychological issues and discharge planning. 12. Team conference will be held weekly to assess progress toward goals and to determine barriers to discharge. 13. Patient will receive at least 3  hours of therapy per day at least 5 days per week. 14. ELOS: 18-24 days  15. Prognosis: excellent   Spent time after patient evaluation counseling patient's  family regarding prognosis and expectations moving forward.    [name], MD, [facility]  [facility] Physical Medicine & Rehabilitation  02/29/2016  Jacquelynn Cree, PA-C  02/28/2016   H&P edited today to reflect correct calendar year 2018. [name], MD, [facility] Physical Medicine & Rehabilitation 03/03/2016

## 2016-02-29 NOTE — Progress Notes (Signed)
Christina Oyster, MD Physician Signed Physical Medicine and Rehabilitation  PMR Pre-admission Date of Service: 02/28/2016 2:40 PM  Related encounter: ED to Hosp-Admission (Discharged) from 02/26/2016 in [facility] (1C)       [] Hide copied text   [employer] PMR Admission Coordinator Pre-Admission Assessment  Patient: Christina Bradshaw is an 83 y.o., female MRN: [id number] DOB: 1924-09-14 Height: 5\' 7"  (170.2 cm) Weight: 66.5 kg (146 lb 9.7 oz)  Insurance Information HMO: No   PPO:       PCP:       IPA:       80/20:       OTHER:   PRIMARY:  Medicare A/B      Policy#:  [id number]      Subscriber: Richardine Service CM Name:        Phone#:       Fax#:   Pre-Cert#:        Employer: Retired  Benefits:  Phone #:       Name: Checked in  [place]. Date: 12/19/91     Deduct: $1340      Out of Pocket Max: none      Life Max: unlimited CIR: 100%      SNF: 100 days Outpatient: 80%     Co-Pay: 20% Home Health: 100%      Co-Pay: none DME: 80%     Co-Pay: 20% Providers: patient's choice  SECONDARY: AARP      Policy#: [id number]      Subscriber: Richardine Service CM Name:        Phone#:       Fax#:   Pre-Cert#:        Employer:   Benefits:  Phone #: [phone]     Name:   Eff. Date:       Deduct:        Out of Pocket Max:        Life Max:   CIR:        SNF:   Outpatient:       Co-Pay:   Home Health:        Co-Pay:   DME:       Co-Pay:    Emergency Contact Information        Contact Information    Name Relation Home Work Mobile   Christina Bradshaw Daughter [phone]     Christina Bradshaw, [name] [phone]     Christina Bradshaw, [name]  [phone]        Current Medical History  Patient Admitting Diagnosis: L CVA  History of Present Illness: An 83 y.o.female with history of HTN who was admitted on 02/26/15 with complaints of dizziness and speech changes since awakening from sleep. Her symptoms did not improve and was taken to [facility] ED by family. NIHSS 7  and MRI brain done revealing acute infarct left corona extending into putamen and left caudate. MRA negative for stenosis/large vessel occlusion. Carotid dopplers with non-occlusive calcific plaque formation in left carotid bulb and no significant ICA stenosis. 2D echo with EF 60-65% with mild LVH and calcified mitral annulus. She did have worsening of weakness overnight with dense right hemiparesis. Dr. Thad Ranger recommends ASA and statin for stroke due to small vessel disease.   an 83 y.o.female with history of HTN who was admitted on 02/26/15 with compliants of dizziness and speech changes since awakening from sleep. Her symptoms did not improve and was taken to [facility] ED by family. NIHSS 7 and MRI brain done revealing  acute infarct left corona extending into putamen and left caudate. MRA negative for stenosis/large vessel occlusion. Carotid dopplers with non-occlusive calcific plaque formation in left carotid bulb and no significant ICA stenosis. 2D echo with EF 60-65% with mild LVH and calcified mitral annulus. an 83 y.o.female with history of HTN who was admitted on 02/26/15 with compliants of dizziness and speech changes since awakening from sleep. Her symptoms did not improve and [facility]. NIHSS 7 and MRI brain done revealing acute infarct left corona extending into putamen and left caudate. MRA negative for stenosis/large vessel occlusion. Carotid dopplers with non-occlusive calcific plaque formation in left carotid bulb and no significant ICA stenosis. 2D echo with EF 60-65% with mild LVH and calcified mitral annulus. She did worsening of weakness overnight with dense right hemiparesis. Dr. Thad Rangereynolds recommends ASA and statin for stroke due to small vessel disease.  BSS done revealing signs of aspiration with thin and placed on dysphagia 1, nectar liquids. She did worsening of weakness overnight with dense right hemiparesis. Dr. Thad Rangereynolds recommends ASA and statin for stroke due  to small vessel disease.   [facility] has been reviewed by the rehabilitation admission coordinator and physician.  NIH Stroke scale: 7  Past Medical History      Past Medical History:  Diagnosis Date  . Hypertension   . Stroke [facility](HCC)     Family History   family history is not on file.  Prior Rehab/Hospitalizations Has the patient had major surgery during 100 days prior to admission? No              Current Medications See MAR [facility]  Patients Current Diet:  Dys 1, nectar thick liquids  Precautions / Restrictions Precautions Precautions: Fall Precaution Comments: Aspiration; Seizure Restrictions Weight Bearing Restrictions: No   Has the patient had 2 or more falls or a fall with injury in the past year?No  Prior Activity Level Limited Community (1-2x/wk): Went to church on Sundays, to grocery store.  Did not drive herself.  Walked daily to AMR Corporationmailbox with her cane.  Prior Functional Level Self Care: Did the patient need help bathing, dressing, using the toilet or eating?  Independent  Indoor Mobility: Did the patient need assistance with walking from room to room (with or without device)? Independent  Stairs: Did the patient need assistance with internal or external stairs (with or without device)? Independent  Functional Cognition: Did the patient need help planning regular tasks such as shopping or remembering to take medications? Independent  Home Assistive Devices / Equipment Home Assistive Devices/Equipment: Cane (specify quad or straight) (straight) Home Equipment: Cane - single point, Cane - quad  Prior Device Use: Indicate devices/aids used by the patient prior to current illness, exacerbation or injury? Straight cane.  Used it to walk to and from the mail box daily.   Prior Functional Level Current Functional Level  Bed Mobility Independent Mod assist    Transfers Independent Mod assist  Mobility - Walk/Wheelchair Independent Mod assist (Amb 3 ft +2 mod assist hemiwalker.)  Upper Body Dressing Independent Mod assist  Lower Body Dressing Independent Max assist  Grooming Independent Other (Set up)  Eating/Drinking Independent Min assist  Toilet Transfer Independent Mod assist  Bladder Continence  WDL Incontinence, using bedpan  Bowel Management WDL NO BM since 02/26/16  Stair Climbing Independent Other (Not tried.)  Communication Intact Dysarthria, low volume  Memory Intact Confusion  Cooking/Meal Prep  Independent     Housework Independent   Money Management Independent   Driving No, does not drive.     Special needs/care consideration BiPAP/CPAP No CPM No Continuous Drip IV 0.9% NS at 75 mL/hr Dialysis No       Life Vest No Oxygen No Special Bed No Trach Size No Wound Vac (area) No    Skin Had some facial precancer spots removed in the past                             Bowel mgmt: Last BM 02/26/16 Bladder mgmt: Voiding on bedpan with incontinence at times Diabetic mgmt No  Previous Home Environment Living Arrangements: Alone  Lives With: Family Available Help at Discharge: Family, Available PRN/intermittently Type of Home: House Home Layout: Two level, Able to live on main level with bedroom/bathroom Home Access: Stairs to enter Entrance Stairs-Rails: Left Entrance Stairs-Number of Steps: 5 STE Bathroom Shower/Tub: Engineer, manufacturing systems: Standard Bathroom Accessibility: Yes How Accessible: Accessible via walker Home Care Services: No  Discharge Living Setting Plans for Discharge Living Setting: Patient's home, House (Will have help at time of discharge.  Lives alone.) Type of Home at Discharge: House Discharge Home Layout: Two level, Able to live on main level with bedroom/bathroom Alternate Level Stairs-Number of Steps: Flight - does not have to go upstairs Discharge Home Access: Stairs to  enter Entrance Stairs-Number of Steps: 5 steps Does the patient have any problems obtaining your medications?: No  Social/Family/Support Systems Patient Roles: Parent (Has 1 dtr and 2 sons.) Contact Information: Family to share caregiver role, hire help if needed. Anticipated Caregiver: Vivianne Spence - dtr Anticipated Caregiver's Contact Information: Burt Ek - [phone] Ability/Limitations of Caregiver: Dtr and sons work. Caregiver Availability: Other (Comment) (Family aware of need for 24/7 assistance after rehab.) Discharge Plan Discussed with Primary Caregiver: Yes Is Caregiver In Agreement with Plan?: Yes Does Caregiver/Family have Issues with Lodging/Transportation while Pt is in Rehab?: No  Goals/Additional Needs Patient/Family Goal for Rehab: PT/OT min assist, SLP mod I and supervision goals Expected length of stay: 14-18 days Cultural Considerations: [employer] Dietary Needs: Dys 1, nectar thick liquids Equipment Needs: TBD Pt/Family Agrees to Admission and willing to participate: Yes Program Orientation Provided & Reviewed with Pt/Caregiver Including Roles  & Responsibilities: Yes  Patient Condition:  I have reviewed all notes in the medical record and have spoken with patient's daughter.  Patient lived alone and was independent PTA.  Family understands that patient will need supervision post rehab discharge.  Patient will benefit and can tolerate 3 hours of therapy a day.  Family all in agreement to acute inpatient rehab admission here at [facility].  I have reviewed all progress with rehab MD and I have approval for acute inpatient rehab admission for today.  Preadmission Screen Completed By:  Trish Mage, 02/28/2016 2:40 PM ______________________________________________________________________   Discussed status with Dr. Riley Kill on 02/29/16 at 0919 and received telephone approval for admission today.  Admission Coordinator:  Trish Mage, time  0919/Date 02/29/16   Assessment/Plan:  Diagnosis: left subcortical infarct 1. Does the need for close, 24 hr/day  Medical supervision in concert with the patient's rehab needs make it unreasonable for this patient to be served in a less intensive setting? Yes 2. Co-Morbidities requiring supervision/potential complications: htn 3. Due to bladder management, bowel management, safety, skin/wound care, disease management, medication administration, pain management and patient education, does the  patient require 24 hr/day rehab nursing? Yes 4. Does the patient require coordinated care of a physician, rehab nurse, PT (1-2 hrs/day, 5 days/week), OT (1-2 hrs/day, 5 days/week) and SLP (1-2 hrs/day, 5 days/week) to address physical and functional deficits in the context of the above medical diagnosis(es)? Yes Addressing deficits in the following areas: balance, endurance, locomotion, strength, transferring, bowel/bladder control, bathing, dressing, feeding, grooming, toileting, cognition, speech and psychosocial support 5. Can the patient actively participate in an intensive therapy program of at least 3 hrs of therapy 5 days a week? Yes 6. The potential for patient to make measurable gains while on inpatient rehab is excellent 7. Anticipated functional outcomes upon discharge from inpatients are: min assist PT, min assist OT, modified independent and supervision SLP 8. Estimated rehab length of stay to reach the above functional goals is: 14-18 days 9. Does the patient have adequate social supports to accommodate these discharge functional goals? Yes 10. Anticipated D/C setting: Home 11. Anticipated post D/C treatments: HH therapy and Outpatient therapy 12. Overall Rehab/Functional Prognosis: excellent    RECOMMENDATIONS: This patient's condition is appropriate for continued rehabilitative care in the following setting: CIR Patient has agreed to participate in recommended program. Yes Note that  insurance prior authorization may be required for reimbursement for recommended care.  Comment:  Pt to be admitted to inpatient rehab today. Christina Oyster, MD, [facility] Physical Medicine & Rehabilitation 02/29/2016   Trish Mage 02/28/2016

## 2016-02-29 NOTE — Progress Notes (Signed)
Clinical Child psychotherapistocial Worker (CSW) received consult for SNF placement. PT is recommending CIR. Per chart patient will admit to CIR. Please reconsult if future social work needs arise. CSW signing off.   Baker Hughes IncorporatedBailey Jaylenn Baiza, LCSW [phone]

## 2016-02-29 NOTE — Progress Notes (Signed)
[facility], pt with no complaints, no distress or discomfort noted, family remains at bedside, awaiting [facility] for transport

## 2016-03-01 ENCOUNTER — Inpatient Hospital Stay (HOSPITAL_COMMUNITY): Payer: Medicare Other | Admitting: *Deleted

## 2016-03-01 ENCOUNTER — Inpatient Hospital Stay (HOSPITAL_COMMUNITY): Payer: Medicare Other | Admitting: Occupational Therapy

## 2016-03-01 ENCOUNTER — Inpatient Hospital Stay (HOSPITAL_COMMUNITY): Payer: Medicare Other | Admitting: Speech Pathology

## 2016-03-01 DIAGNOSIS — R1312 Dysphagia, oropharyngeal phase: Secondary | ICD-10-CM

## 2016-03-01 DIAGNOSIS — G8111 Spastic hemiplegia affecting right dominant side: Secondary | ICD-10-CM

## 2016-03-01 DIAGNOSIS — E46 Unspecified protein-calorie malnutrition: Secondary | ICD-10-CM

## 2016-03-01 DIAGNOSIS — N183 Chronic kidney disease, stage 3 unspecified: Secondary | ICD-10-CM

## 2016-03-01 DIAGNOSIS — I10 Essential (primary) hypertension: Secondary | ICD-10-CM

## 2016-03-01 DIAGNOSIS — R7303 Prediabetes: Secondary | ICD-10-CM

## 2016-03-01 DIAGNOSIS — E876 Hypokalemia: Secondary | ICD-10-CM | POA: Insufficient documentation

## 2016-03-01 LAB — CBC WITH DIFFERENTIAL/PLATELET
BASOS ABS: 0 10*3/uL (ref 0.0–0.1)
BASOS PCT: 0 %
Eosinophils Absolute: 0 10*3/uL (ref 0.0–0.7)
Eosinophils Relative: 0 %
HCT: 44.9 % (ref 36.0–46.0)
Hemoglobin: 14.6 g/dL (ref 12.0–15.0)
Lymphocytes Relative: 11 %
Lymphs Abs: 1 10*3/uL (ref 0.7–4.0)
MCH: 30.9 pg (ref 26.0–34.0)
MCHC: 32.5 g/dL (ref 30.0–36.0)
MCV: 94.9 fL (ref 78.0–100.0)
MONO ABS: 0.5 10*3/uL (ref 0.1–1.0)
MONOS PCT: 6 %
NEUTROS ABS: 6.8 10*3/uL (ref 1.7–7.7)
NEUTROS PCT: 83 %
Platelets: 182 10*3/uL (ref 150–400)
RBC: 4.73 MIL/uL (ref 3.87–5.11)
RDW: 14.6 % (ref 11.5–15.5)
WBC: 8.4 10*3/uL (ref 4.0–10.5)

## 2016-03-01 LAB — COMPREHENSIVE METABOLIC PANEL
ALBUMIN: 2.8 g/dL — AB (ref 3.5–5.0)
ALT: 64 U/L — ABNORMAL HIGH (ref 14–54)
ANION GAP: 8 (ref 5–15)
AST: 82 U/L — AB (ref 15–41)
Alkaline Phosphatase: 54 U/L (ref 38–126)
BUN: 22 mg/dL — AB (ref 6–20)
CHLORIDE: 113 mmol/L — AB (ref 101–111)
CO2: 23 mmol/L (ref 22–32)
Calcium: 8.4 mg/dL — ABNORMAL LOW (ref 8.9–10.3)
Creatinine, Ser: 1.07 mg/dL — ABNORMAL HIGH (ref 0.44–1.00)
GFR calc Af Amer: 52 mL/min — ABNORMAL LOW (ref >60)
GFR calc non Af Amer: 45 mL/min — ABNORMAL LOW (ref >60)
GLUCOSE: 121 mg/dL — AB (ref 65–99)
POTASSIUM: 3.3 mmol/L — AB (ref 3.5–5.1)
SODIUM: 144 mmol/L (ref 135–145)
Total Bilirubin: 0.8 mg/dL (ref 0.3–1.2)
Total Protein: 5.6 g/dL — ABNORMAL LOW (ref 6.5–8.1)

## 2016-03-01 MED ORDER — PANTOPRAZOLE SODIUM 40 MG PO PACK
40.0000 mg | PACK | Freq: Every day | ORAL | Status: DC
  Administered 2016-03-01 – 2016-03-26 (×24): 40 mg
  Filled 2016-03-01 (×24): qty 20

## 2016-03-01 MED ORDER — POTASSIUM CHLORIDE 20 MEQ PO PACK
20.0000 meq | PACK | Freq: Two times a day (BID) | ORAL | Status: AC
  Administered 2016-03-01 – 2016-03-02 (×4): 20 meq via ORAL
  Filled 2016-03-01 (×5): qty 1

## 2016-03-01 MED ORDER — PRO-STAT SUGAR FREE PO LIQD
30.0000 mL | Freq: Two times a day (BID) | ORAL | Status: DC
  Administered 2016-03-01 – 2016-03-26 (×47): 30 mL via ORAL
  Filled 2016-03-01 (×48): qty 30

## 2016-03-01 NOTE — Progress Notes (Signed)
Christina Bradshaw PHYSICAL MEDICINE & REHABILITATION     PROGRESS NOTE  Subjective/Complaints:  Pt seen laying in bed this AM.  Per nursing, pt with temp overnight.  She states she slept well overnight.   ROS: Denies CP, SOB, N/V/D.  Objective: Vital Signs: Blood pressure (!) 164/72, pulse 85, temperature 100.2 F (37.9 C), temperature source Oral, resp. rate 16, height 5\' 7"  (1.702 m), weight 53.9 kg (118 lb 13.3 oz), SpO2 95 %. No results found.  Recent Labs  03/01/16 0516  WBC 8.4  HGB 14.6  HCT 44.9  PLT 182    Recent Labs  03/01/16 0516  NA 144  K 3.3*  CL 113*  GLUCOSE 121*  BUN 22*  CREATININE 1.07*  CALCIUM 8.4*   CBG (last 3)   Recent Labs  02/27/16 1142  GLUCAP 108*    Wt Readings from Last 3 Encounters:  03/01/16 53.9 kg (118 lb 13.3 oz)  02/27/16 66.5 kg (146 lb 9.7 oz)  10/25/14 67.6 kg (149 lb)    Physical Exam:  BP (!) 164/72 (BP Location: Left Arm)   Pulse 85   Temp 100.2 F (37.9 C) (Oral)   Resp 16   Ht 5\' 7"  (1.702 m)   Wt 53.9 kg (118 lb 13.3 oz)   SpO2 95%   BMI 18.61 kg/m  Constitutional: She appears well-developed and well-nourished.  HENT: Normocephalic and atraumatic.  Eyes: EOM are normal. No discharge.  Neck: Normal range of motion. Neck supple.  Cardiovascular: Normal rate and regular rhythm. No JVD. Murmur heard.  Respiratory: No stridor. Unlabored. Clear GI: Soft. Bowel sounds are normal.  Musculoskeletal: She exhibits no edema. No tenderness Neurological: She is alert, not oriented to date.  Right facial weakness with moderate to severe dysarthria and tongue with deviation to right.  Able to follow basic commands Dense right hemiparesis with extensor tone RLE and flexor tone RUE.  Motor: RUEprox to distal. RLE: 1/5 HF, 0/5 distally.  Sensation diminished to light touch RUE/RLE Skin: Skin is warm and dry. Intact. Psychiatric: She has a normal mood and affect. Her behavior is normal.   Assessment/Plan: 1. Functional  deficits secondary to left corona radiata,putamen,caudate infarct which require 3+ hours per day of interdisciplinary therapy in a comprehensive inpatient rehab setting. Physiatrist is providing close team supervision and 24 hour management of active medical problems listed below. Physiatrist and rehab team continue to assess barriers to discharge/monitor patient progress toward functional and medical goals.  Function:  Bathing Bathing position      Bathing parts      Bathing assist        Upper Body Dressing/Undressing Upper body dressing                    Upper body assist        Lower Body Dressing/Undressing Lower body dressing                                  Lower body assist        Toileting Toileting Toileting activity did not occur: No continent bowel/bladder event        Toileting assist     Transfers Chair/bed transfer Chair/bed transfer activity did not occur: N/A           LobbyistLocomotion Ambulation           Wheelchair  Cognition Comprehension Comprehension assist level: Understands basic 25 - 49% of the time/ requires cueing 50 - 75% of the time  Expression Expression assist level: Expresses basic 50 - 74% of the time/requires cueing 25 - 49% of the time. Needs to repeat parts of sentences.  Social Interaction Social Interaction assist level: Interacts appropriately 25 - 49% of time - Needs frequent redirection.  Problem Solving Problem solving assist level: Solves basic 25 - 49% of the time - needs direction more than half the time to initiate, plan or complete simple activities  Memory Memory assist level: Recognizes or recalls 50 - 74% of the time/requires cueing 25 - 49% of the time    Medical Problem List and Plan: 1. Right hemiparesis and dysarthria/dysphagia secondary to left corona radiata,putamen,caudate infarct on 1/9  Chart reviewed for history  MRI 1/11 reviewed see above, also with atrophy  Begin  CIR 2. DVT Prophylaxis/Anticoagulation: Pharmaceutical: Lovenox  3. Pain Management: tylenol prn  4. Mood:   Has history of anxiety  Started prozac to help with mood and recovery.   LCSW to follow for evaluation and support.  5. Neuropsych: This patient is not fully capable of making decisions on her own behalf.  6. Skin/Wound Care: Routine pressure relief measures. Maintain adequate nutritional and hydration status.  7. Fluids/Electrolytes/Nutrition: Monitor I/O. Supplements between meals.  8. HTN: Monitor BP bid  On Norvasc, lisinopril and metoprolol bid.   Elevated, monitor with increased activity, allow for permissive HTN with gradual normalization 9. Dysphagia: Continue dysphagia 1, nectar liquids. Offer fluids between meals to maintain adequate hydration.  10. Dyslipidemia: on lipitor  11. Prediabetes: Hgb A1c- 6.0--has been stable.   Modified diet to HH/CM. RD to educate patient and family on appropriate diet.   CBGs overall controlled 1/13 12. GERD: Managed On Protonix.  13. Chronic constipation: Uses softners as well as suppository daily.   Start senna S at supper followed by suppository in am.  14. Emerging right spastic hemiparesis:   Ordered baclofen prn for now.   Resting hand splint and R-AFO.   ROM with therapy.   Reviewed stretching,positioning with family  80. Hypokalemia  K+ 3.3 on 1/13  Supplemented, cont to monitor 16. Hypoalbuminemia  Supplement initiated 1/13 17. CKD  Cr. 1.07 on 1/13  Cont to monitor  LOS (Days) 1 A FACE TO FACE EVALUATION WAS PERFORMED  Jarrius Huaracha Karis Juba 03/01/2016 7:48 AM

## 2016-03-01 NOTE — Evaluation (Signed)
Occupational Therapy Assessment and Plan  Patient Details  Name: Christina Bradshaw MRN: [id number] Date of Birth: [birth year]  OT Diagnosis: flaccid hemiplegia and hemiparesis, hemiplegia affecting dominant side and muscle weakness (generalized) Rehab Potential: Rehab Potential (ACUTE ONLY): Fair ELOS: 22-25 days   Today's Date: 03/01/2016 OT Individual Time: 1000-1105 OT Individual Time Calculation (min): 65 min      Problem List: Patient Active Problem List   Diagnosis Date Noted  . Prediabetes   . Hypokalemia   . Hypoalbuminemia due to protein-calorie malnutrition ([place])   . Stage 3 chronic kidney disease   . Left sided cerebral hemisphere cerebrovascular accident (CVA) ([place]) 02/29/2016  . Spastic hemiparesis of right dominant side due to cerebral infarction ([place]) 02/29/2016  . Dysphagia 02/29/2016  . Essential hypertension 02/29/2016  . TIA (transient ischemic attack) 02/27/2016  . CVA (cerebral vascular accident) ([place]) 02/27/2016    Past Medical History:  Past Medical History:  Diagnosis Date  . Hypertension   . Stroke [facility])    Past Surgical History:  Past Surgical History:  Procedure Laterality Date  . ABDOMINAL HYSTERECTOMY      Assessment & Plan Clinical Impression: Patient is a 83 y.o. year old female with recent admission to the hospital on 02/26/15 with complaints of dizziness and speech changes since awakening from sleep. Her symptoms did not improve and was taken to [facility] ED by family. NIHSS 7 and MRI brain done revealing acute infarct left corona radiata extending into putamen and left caudate. MRA negative for stenosis/large vessel occlusion. Carotid dopplers with non-occlusive calcific plaque formation in left carotid bulb and no significant ICA stenosis. 2D echo with EF 60-65% with mild LVH and calcified mitral annulus. She did worsening of weakness overnight with dense right hemiparesis. Dr. Doy Mince recommends ASA and statin for stroke due to small vessel  disease. Cardiac enzymes being monitored and elevation felt to be due to demand ischemia and doubt MI per reports. BSS done revealing signs of aspiration with thin and placed on dysphagia 1, nectar liquids. Therapy evaluations done revealing decreased balance with posterior lean and RLE instability. CIR recommended for follow up therapy.  .  Patient transferred to CIR on 02/29/2016 .    Patient currently requires total with basic self-care skills secondary to muscle weakness, decreased cardiorespiratoy endurance, abnormal tone, unbalanced muscle activation and flaccid hemiplegia, decreased awareness, decreased problem solving and decreased memory and decreased standing balance, hemiplegia and decreased balance strategies.  Prior to hospitalization, patient could complete ADLs and IADLs with independent .  Patient will benefit from skilled intervention to decrease level of assist with basic self-care skills prior to discharge home with care partner.  Anticipate patient will require 24 hour supervision and minimal physical assistance and follow up home health.  OT - End of Session Activity Tolerance: Tolerates 30+ min activity with multiple rests Endurance Deficit: Yes Endurance Deficit Description: required multiple rest breaks throughout sessions OT Assessment Rehab Potential (ACUTE ONLY): Fair Barriers to Discharge: Decreased caregiver support Barriers to Discharge Comments: family reports they plan to provide assistance, but unsure how much OT Patient demonstrates impairments in the following area(s): Balance;Cognition;Endurance;Motor;Pain;Perception;Safety;Sensory;Skin Integrity OT Basic ADL's Functional Problem(s): Eating;Grooming;Bathing;Dressing;Toileting OT Transfers Functional Problem(s): Toilet;Tub/Shower OT Additional Impairment(s): Fuctional Use of Upper Extremity OT Plan OT Intensity: Minimum of 1-2 x/day, 45 to 90 minutes OT Frequency: 5 out of 7 days OT Duration/Estimated Length of  Stay: 22-25 days OT Treatment/Interventions: Balance/vestibular training;Cognitive remediation/compensation;Discharge planning;Disease mangement/prevention;DME/adaptive equipment instruction;Functional electrical stimulation;Functional mobility training;Neuromuscular re-education;Pain management;Patient/family education;Psychosocial support;Self  Care/advanced ADL retraining;Skin care/wound managment;Splinting/orthotics;Therapeutic Activities;Therapeutic Exercise;UE/LE Strength taining/ROM;UE/LE Coordination activities;Visual/perceptual remediation/compensation;Wheelchair propulsion/positioning OT Self Feeding Anticipated Outcome(s): Supervision/setup OT Basic Self-Care Anticipated Outcome(s): Min assist OT Toileting Anticipated Outcome(s): Min assist OT Bathroom Transfers Anticipated Outcome(s): Min assist OT Recommendation Patient destination: Home Follow Up Recommendations: Home health OT;24 hour supervision/assistance Equipment Recommended: 3 in 1 bedside comode;Tub/shower bench;To be determined   Skilled Therapeutic Intervention OT eval completed with discussion of rehab process, OT purpose, POC, goals, and ELOS.  ADL assessment completed with focus on transfers, bathing, and dressing.  Max assist squat pivot transfer bed > w/c.  Engaged in bathing seated at sink with assist to lift RUE to allow pt to wash it.  Pt reports need to toilet.  Completed squat pivot transfer w/c > toilet with max assist, then unable to complete sit > stand to doff brief.  +2 assist for sit > stand followed by use of Stedy for sit > stand for hygiene and LB dressing post toileting.  Pt requiring max assist for anterior weight shift even when able to pull up on Stedy for sit > stand.  Returned to bed due to fatigue at end of session.  Provided pt with half lap tray for increased positioning of RUE when seated in w/c as well as handout for improved positioning when up in chair as well as bed.  OT  Evaluation Precautions/Restrictions  Precautions Precautions: Fall Precaution Comments: dense Rt hemiplegia General   Vital Signs Therapy Vitals Temp: 97.8 F (36.6 C) Temp Source: Oral Pulse Rate: 73 BP: (!) 111/56 Patient Position (if appropriate): Lying Pain Pain Assessment Pain Assessment: No/denies pain Home Living/Prior Functioning Home Living Family/patient expects to be discharged to:: Private residence Living Arrangements: Alone Available Help at Discharge: Family, Available PRN/intermittently Type of Home: House Home Access: Stairs to enter Technical brewer of Steps: 4-5 Entrance Stairs-Rails: Left Home Layout: Two level, Able to live on main level with bedroom/bathroom Bathroom Shower/Tub: Chiropodist: Standard Bathroom Accessibility: Yes Additional Comments: Pt has single point cane that she used outside  Lives With: Alone IADL History Homemaking Responsibilities: Yes Meal Prep Responsibility: Primary Laundry Responsibility: Primary Cleaning Responsibility: Primary Current License: No Prior Function Level of Independence: Independent with basic ADLs, Independent with homemaking with ambulation, Independent with gait Driving: No Comments: Pt independent (except does not drive) and occasionally uses SPC outside on uneven ground. ADL  See Function Navigator Vision/Perception  Vision- History Baseline Vision/History: No visual deficits Patient Visual Report: No change from baseline Vision- Assessment Vision Assessment?: No apparent visual deficits  Cognition Overall Cognitive Status: Impaired/Different from baseline Arousal/Alertness: Awake/alert (Simultaneous filing. User may not have seen previous data.) Orientation Level: Person;Place;Situation Person: Oriented Place: Oriented Situation: Oriented Year: 2018 Month: January Day of Week: Correct Memory: Appears intact  Memory Impairment: Decreased recall of new  information Immediate Memory Recall: Sock;Blue;Bed Memory Recall: Blue;Sock;Bed Memory Recall Sock: With Cue Memory Recall Blue: Without Cue Memory Recall Bed: With Cue Attention: Sustained;Selective Sustained Attention: Appears intact Awareness: Impaired Awareness Impairment: Emergent impairment Problem Solving: Impaired Problem Solving Impairment: Functional basic Safety/Judgment: Appears intact  Sensation Sensation Light Touch: Impaired Detail Light Touch Impaired Details: Impaired RUE;Impaired RLE Proprioception: Impaired Detail Proprioception Impaired Details: Impaired RUE;Impaired RLE Coordination Gross Motor Movements are Fluid and Coordinated: No Fine Motor Movements are Fluid and Coordinated: No Coordination and Movement Description: flaccid RUE and RLE Extremity/Trunk Assessment RUE Assessment RUE Assessment: Exceptions to [facility] (flaccid, tolerates full PROM) LUE Assessment LUE Assessment: Within Functional Limits  See Function Navigator for Current Functional Status.   Refer to Care Plan for Long Term Goals  Recommendations for other services: None    Discharge Criteria: Patient will be discharged from OT if patient refuses treatment 3 consecutive times without medical reason, if treatment goals not met, if there is a change in medical status, if patient makes no progress towards goals or if patient is discharged from hospital.  The above assessment, treatment plan, treatment alternatives and goals were discussed and mutually agreed upon: by patient and by family  Ellwood Dense [facility] 03/01/2016, 12:31 PM

## 2016-03-01 NOTE — Progress Notes (Signed)
Orthopedic Tech Progress Note Patient Details:  Christina Bradshaw [birth year] [id number]  Patient ID: Christina Bradshaw, female   DOB: [birth year], 83 y.o.   MRN: [id number]   Saul FordyceJennifer C Jacorion Klem 03/01/2016, 8:51 AMCalled [facility] for right AFO brace and resting hand splint.

## 2016-03-01 NOTE — Evaluation (Signed)
Physical Therapy Assessment and Plan  Patient Details  Name: Christina Bradshaw MRN: [id number] Date of Birth: 09-Oct-1924  PT Diagnosis: Abnormal posture, Abnormality of gait, Cognitive deficits, Hemiparesis dominant and Impaired sensation2 Rehab Potential: Good2 ELOS: 22-25 days   Today's Date: 03/01/2016 PT Individual Time: 8315-1761 PT Individual Time Calculation (min): 77 min     Problem List: Patient Active Problem List   Diagnosis Date Noted  . Prediabetes   . Hypokalemia   . Hypoalbuminemia due to protein-calorie malnutrition ([place])   . Stage 3 chronic kidney disease   . Left sided cerebral hemisphere cerebrovascular accident (CVA) ([place]) 02/29/2016  . Spastic hemiparesis of right dominant side due to cerebral infarction ([place]) 02/29/2016  . Dysphagia 02/29/2016  . Essential hypertension 02/29/2016  . TIA (transient ischemic attack) 02/27/2016  . CVA (cerebral vascular accident) ([place]) 02/27/2016    Past Medical History:  Past Medical History:  Diagnosis Date  . Hypertension   . Stroke [facility])    Past Surgical History:  Past Surgical History:  Procedure Laterality Date  . ABDOMINAL HYSTERECTOMY      Assessment & Plan Clinical Impression: Christina Bradshaw is an 83 y.o. female with history of HTN, palpitations, who was admitted on 02/26/15 with compliants of dizziness and speech changes since awakening from sleep. Her symptoms did not improve and was taken to [facility] ED by family. NIHSS 7 and MRI brain done revealing acute infarct left corona radiata extending into putamen and left caudate. MRA negative for stenosis/large vessel occlusion. Carotid dopplers with non-occlusive calcific plaque formation in left carotid bulb and no significant ICA stenosis. 2D echo with EF 60-65% with mild LVH and calcified mitral annulus. She did worsening of weakness overnight with dense right hemiparesis. Dr. Doy Mince recommends ASA and statin for stroke due to small vessel disease. Cardiac enzymes  being monitored and elevation felt to be due to demand ischemia and doubt MI per reports. BSS done revealing signs of aspiration with thin and placed on dysphagia 1, nectar liquids. Therapy evaluations done revealing decreased balance with posterior lean and RLE instability.   Patient transferred to CIR on 02/29/2016 .   Patient currently requires max with mobility secondary to muscle weakness, decreased cardiorespiratoy endurance, impaired timing and sequencing, unbalanced muscle activation and decreased coordination, decreased attention to right, decreased awareness, decreased problem solving and decreased memory and decreased sitting balance, decreased standing balance, decreased postural control, hemiplegia and decreased balance strategies.  Prior to hospitalization, patient was independent  with mobility and lived with Alone in a House home.  Home access is 4-5Stairs to enter.  Patient will benefit from skilled PT intervention to maximize safe functional mobility, minimize fall risk and decrease caregiver burden for planned discharge home with 24 hour assist.  Anticipate patient will benefit from follow up [facility] at discharge.  PT - End of Session Activity Tolerance: Tolerates < 10 min activity, no significant change in vital signs Endurance Deficit: Yes Endurance Deficit Description: required multiple rest breaks throughout sessions PT Assessment Rehab Potential (ACUTE/IP ONLY): Good Barriers to Discharge: Decreased caregiver support PT Patient demonstrates impairments in the following area(s): Balance;Endurance;Motor;Perception;Safety;Sensory PT Transfers Functional Problem(s): Bed Mobility;Bed to Chair;Car;Furniture PT Locomotion Functional Problem(s): Ambulation;Wheelchair Mobility;Stairs PT Plan PT Intensity: Minimum of 1-2 x/day ,45 to 90 minutes PT Frequency: 5 out of 7 days PT Duration Estimated Length of Stay: 22-25 days PT Treatment/Interventions: Ambulation/gait  training;Balance/vestibular training;Cognitive remediation/compensation;Discharge planning;Disease management/prevention;DME/adaptive equipment instruction;Functional mobility training;Neuromuscular re-education;Pain management;Patient/family education;Psychosocial support;Skin care/wound management;Splinting/orthotics;Stair training;Therapeutic Activities;Therapeutic Exercise;UE/LE Strength taining/ROM;UE/LE  Coordination activities;Visual/perceptual remediation/compensation;Wheelchair propulsion/positioning PT Transfers Anticipated Outcome(s): Min A PT Locomotion Anticipated Outcome(s): Min A x50' PT Recommendation Follow Up Recommendations: Home health PT;24 hour supervision/assistance Patient destination: Home Equipment Recommended: Rolling walker with 5" wheels;Wheelchair cushion (measurements);Wheelchair (measurements)  Skilled Therapeutic Intervention Pt tx initiated upon eval. Pt oriented to unit, call bell, PT POC, and principles of rehab and stroke recovery. Pt was educated on fall risk reduction and precautions. Pt instructed in transfer training for basic squat pivot transfer, sit<>stands, sitting balance, standing balance training, car transfer,WC propulsion,  stair training, and gait at hall rail. Pt required up to Max A for all mobility due to R-sided weakness. Pt perofrmed 5 min on K[place] in [place] position for reciprocal strengthening x5 min at 80cm/sec. Pt performed standing balance in hall rail with NMR for weight shifting and RLE strengthening in stance. Pt needed constant cues for upright head/trunk posture. Pt left up in [facility] with family and all needs in reach.     PT Evaluation Precautions/Restrictions Precautions Precautions: Fall Precaution Comments: dense Rt hemiplegia Restrictions Weight Bearing Restrictions: No General   Vital SignsTherapy Vitals Temp: 98.2 F (36.8 C) Temp Source: Oral Pulse Rate: 68 Resp: 18 BP: (!) 164/66 Patient Position (if appropriate):  Sitting Oxygen Therapy SpO2: 97 % O2 Device: Not Delivered Pain none   Home Living/Prior Functioning Home Living Available Help at Discharge: Family;Available PRN/intermittently Type of Home: House Home Access: Stairs to enter [employer] of Steps: 4-5 Entrance Stairs-Rails: Left Home Layout: Two level;Able to live on main level with bedroom/bathroom Bathroom Shower/Tub: Chiropodist: Standard Bathroom Accessibility: Yes Additional Comments: Pt has single point cane that she used outside  Lives With: Alone Prior Function Level of Independence: Independent with basic ADLs;Independent with homemaking with ambulation;Independent with gait  Able to Take Stairs?: Yes Driving: No Vision/Perception  Vision - History Baseline Vision: Wears glasses only for reading Patient Visual Report: No change from baseline Vision - Assessment Eye Alignment: Within Functional Limits Perception Perception: Impaired Inattention/Neglect: Does not attend to right side of body Praxis Praxis: Intact  Cognition Overall Cognitive Status: Impaired/Different from baseline Arousal/Alertness: Awake/alert Orientation Level: Oriented to person;Oriented to situation;Other (comment) (expressive aphasia, UTA) Attention: Sustained;Selective Sustained Attention: Appears intact Memory: Appears intact Memory Impairment: Decreased recall of new information Awareness: Impaired Awareness Impairment: Emergent impairment Problem Solving: Impaired Problem Solving Impairment: Functional basic Safety/Judgment: Appears intact Sensation Sensation Light Touch: Impaired Detail Light Touch Impaired Details: Impaired RUE Proprioception: Impaired Detail Proprioception Impaired Details: Impaired RUE Additional Comments: Pt able to localize light touch and proprioception in RLE Coordination Gross Motor Movements are Fluid and Coordinated: No Fine Motor Movements are Fluid and Coordinated:  No Coordination and Movement Description: flaccid RUE and RLE Heel Shin Test: Unable Motor  Motor Motor: Hemiplegia Motor - Skilled Clinical Observations: Pt with trace movement in R hip and knee in stance.   Mobility Bed Mobility Bed Mobility: Rolling Left;Left Sidelying to Sit Rolling Left: 3: Mod assist Left Sidelying to Sit: 2: Max assist Left Sidelying to Sit Details (indicate cue type and reason): Pt needed cues for initiation, sequence, and manual facilitation for R UE/LE management Transfers Transfers: Yes Squat Pivot Transfers: 2: Max assist Squat Pivot Transfer Details (indicate cue type and reason): Max A for lifting, comlpeting turn safely, and lowering. PT blocks R knee and assist with anterior translation Locomotion  Ambulation Ambulation: Yes Ambulation/Gait Assistance: 2: Max assist Ambulation Distance (Feet): 15 Feet Assistive device: Other (Comment) Engineer, agricultural rail) Ambulation/Gait Assistance Details:  Pt needs step-by-step cues and manual facilitation for weight shifting and RLE management throuhgout stance and swing phases Stairs / Additional Locomotion Stairs: Yes Stairs Assistance: 2: Max Industrial/product designer Assistance Details: Tactile cues for initiation;Tactile cues for weight shifting;Visual cues for safe use of DME/AE;Verbal cues for sequencing;Verbal cues for technique;Verbal cues for precautions/safety;Verbal cues for gait pattern;Manual facilitation for weight shifting;Manual facilitation for placement Stair Management Technique: One rail Left;Step to pattern;Forwards;Backwards Number of Stairs: 1 Height of Stairs: 4 Architect: Yes Wheelchair Assistance: 3: Building surveyor Details: Visual cues/gestures for precautions/safety;Visual cues/gestures for sequencing;Verbal cues for sequencing;Verbal cues for technique;Verbal cues for safe use of DME/AE Wheelchair Propulsion: Left upper extremity;Left lower  extremity Wheelchair Parts Management: Needs assistance Distance: 35  Trunk/Postural Assessment  Cervical Assessment Cervical Assessment: Within Functional Limits Thoracic Assessment Thoracic Assessment: Within Functional Limits Lumbar Assessment Lumbar Assessment: Within Functional Limits Postural Control Postural Control: Deficits on evaluation Trunk Control: Forward flexed trunk in stance Righting Reactions: Delayed Protective Responses: Ineffective   Balance Balance Balance Assessed: Yes Static Sitting Balance Static Sitting - Balance Support: Right upper extremity supported;Left upper extremity supported;Feet supported Static Sitting - Level of Assistance: 5: Stand by assistance Dynamic Sitting Balance Dynamic Sitting - Balance Support: Left upper extremity supported;Feet supported;During functional activity Dynamic Sitting - Level of Assistance: 4: Min assist Static Standing Balance Static Standing - Balance Support: Left upper extremity supported;During functional activity Static Standing - Level of Assistance: 3: Mod assist Dynamic Standing Balance Dynamic Standing - Level of Assistance: 2: Max assist Dynamic Standing - Balance Activities: Lateral lean/weight shifting;Forward lean/weight shifting Extremity Assessment  RUE Assessment RUE Assessment: Exceptions to [facility] (flaccid, tolerates full PROM) LUE Assessment LUE Assessment: Within Functional Limits RLE Assessment RLE Assessment: Exceptions to [facility] RLE PROM (degrees) RLE Overall PROM Comments: WFL except DF to neutral RLE Strength RLE Overall Strength Comments: Travel hip flexion and knee ext in stance LLE Assessment LLE Assessment: Within Functional Limits   See Function Navigator for Current Functional Status.   Refer to Care Plan for Long Term Goals  Recommendations for other services: None   Discharge Criteria: Patient will be discharged from PT if patient refuses treatment 3 consecutive times without  medical reason, if treatment goals not met, if there is a change in medical status, if patient makes no progress towards goals or if patient is discharged from hospital.  The above assessment, treatment plan, treatment alternatives and goals were discussed and mutually agreed upon: by patient  Jermain Curt, Corinna Lines, PT, DPT  03/01/2016, 4:28 PM

## 2016-03-01 NOTE — Evaluation (Signed)
Speech Language Pathology Assessment and Plan  Patient Details  Name: Christina Bradshaw MRN: [id number] Date of Birth: [birth year]  SLP Diagnosis: Speech and Language deficits;Dysphagia;Cognitive Impairments  Rehab Potential: Good ELOS: 3.5 weeks     Today's Date: 03/01/2016 SLP Individual Time: 0720-0820 SLP Individual Time Calculation (min): 60 min    Problem List: Patient Active Problem List   Diagnosis Date Noted  . Prediabetes   . Hypokalemia   . Hypoalbuminemia due to protein-calorie malnutrition ([place])   . Stage 3 chronic kidney disease   . Left sided cerebral hemisphere cerebrovascular accident (CVA) ([place]) 02/29/2016  . Spastic hemiparesis of right dominant side due to cerebral infarction ([place]) 02/29/2016  . Dysphagia 02/29/2016  . Essential hypertension 02/29/2016  . TIA (transient ischemic attack) 02/27/2016  . CVA (cerebral vascular accident) ([place]) 02/27/2016   Past Medical History:  Past Medical History:  Diagnosis Date  . Hypertension   . Stroke [facility])    Past Surgical History:  Past Surgical History:  Procedure Laterality Date  . ABDOMINAL HYSTERECTOMY      Assessment / Plan / Recommendation Clinical Impression Patient is an 83 y.o. female with history of HTN, palpitations, who was admitted on 02/26/15 with compliants of dizziness and speech changes since awakening from sleep. Her symptoms did not improve and was taken to [facility] ED by family. NIHSS 7 and MRI brain done revealing acute infarct left corona radiata extending into putamen and left caudate. MRA negative for stenosis/large vessel occlusion. Carotid dopplers with non-occlusive calcific plaque formation in left carotid bulb and no significant ICA stenosis. 2D echo with EF 60-65% with mild LVH and calcified mitral annulus. She did worsening of weakness overnight with dense right hemiparesis. Dr. Doy Mince recommends ASA and statin for stroke due to small vessel disease. Cardiac enzymes being monitored and  elevation felt to be due to demand ischemia and doubt MI per reports. BSS done revealing signs of aspiration with thin and placed on dysphagia 1, nectar liquids. Therapy evaluations done revealing decreased balance with posterior lean and RLE instability. CIR recommended for follow up therapy and patient admitted 02/29/16.   Patient was administered a cognitive-linguistic evaluation and demonstrates moderate-severe dysarthria characterized by right oral-motor weakness resulting in imprecise consonants and a low vocal intensity with a hoarse vocal quality. Patient followed 3 step commands with extra time with mild motor planning issues noted. Patient completed responsive naming and confrontational naming tasks with 100% accuracy but demonstrated word-finding deficits during informal conversation. Moderate cognitive impairments were also noted impacting intellectual awareness, problem solving and recall during functional and familiar tasks. Patient was administered a BSE. Patient demonstrated a suspected delayed swallow initiation with all solids and liquids and immediate coughing with thin liquids via tsp and nectar-thick liquids via cup. Coughing was eliminated with use of small sips of nectar-thick liquids via tsp and strict aspiration precautions involving a slow rate, multiple swallows and intermittent throat clearing. Recommend patient continue Dys. 1 textures and downgrade to nectar-thick liquids via tsp. Patient would benefit from skilled SLP intervention to maximize her functional communication, cognitive function and swallowing function prior to discharge in order to maximize her overall functional independence.     Skilled Therapeutic Interventions          Administered a cognitive-linguistic evaluation and BSE. Please see above for details.   SLP Assessment  Patient will need skilled Speech Lanaguage Pathology Services during CIR admission    Recommendations  SLP Diet Recommendations: Dysphagia 1  (Puree);Nectar Liquid Administration via: [employer]  Medication Administration: Crushed with puree Supervision: Patient able to self feed;Full supervision/cueing for compensatory strategies Compensations: Minimize environmental distractions;Slow rate;Small sips/bites;Monitor for anterior loss;Multiple dry swallows after each bite/sip;Clear throat intermittently;Lingual sweep for clearance of pocketing Postural Changes and/or Swallow Maneuvers: Seated upright 90 degrees Oral Care Recommendations: Oral care BID Patient destination: Home Follow up Recommendations: Home Health SLP;24 hour supervision/assistance Equipment Recommended: To be determined    SLP Frequency 3 to 5 out of 7 days   SLP Duration  SLP Intensity  SLP Treatment/Interventions 3.5 weeks   Minumum of 1-2 x/day, 30 to 90 minutes  Cognitive remediation/compensation;Cueing hierarchy;Functional tasks;Patient/family education;Therapeutic Activities;Internal/external aids;Speech/Language facilitation;Environmental controls    Pain Pain Assessment Pain Assessment: No/denies pain  Prior Functioning Type of Home: House  Lives With: Alone Available Help at Discharge: Family;Available PRN/intermittently  Function:  Eating Eating   Modified Consistency Diet: Yes Eating Assist Level: Set up assist for;More than reasonable amount of time;Helper checks for pocketed food;Supervision or verbal cues   Eating Set Up Assist For: Opening containers       Cognition Comprehension Comprehension assist level: Understands basic 75 - 89% of the time/ requires cueing 10 - 24% of the time  Expression   Expression assist level: Expresses basic 50 - 74% of the time/requires cueing 25 - 49% of the time. Needs to repeat parts of sentences.  Social Interaction Social Interaction assist level: Interacts appropriately 75 - 89% of the time - Needs redirection for appropriate language or to initiate interaction.  Problem Solving Problem solving  assist level: Solves basic 25 - 49% of the time - needs direction more than half the time to initiate, plan or complete simple activities  Memory Memory assist level: Recognizes or recalls 25 - 49% of the time/requires cueing 50 - 75% of the time   Short Term Goals: Week 1: SLP Short Term Goal 1 (Week 1): Patient will consume current diet with minimal overt s/s of aspiration with Mod A verbal cues for use of swallowing compensatory strategies.  SLP Short Term Goal 2 (Week 1): Patient will consume trials of nectar-thick liquids via cup with minimal overt s/s of aspiration over 2 sessions prior to upgrade.  SLP Short Term Goal 3 (Week 1): Patient will utilize speech intelligibility strategies at the phrase level with Mod A multimodal cues.  SLP Short Term Goal 4 (Week 1): Patient will utilize word-finding strateiges during functional tasks/conversations with Min A multimodal cues.  SLP Short Term Goal 5 (Week 1): Patient will demonstrate functional problem solving for basic and familiar tasks with Mod A multimodal cues.  SLP Short Term Goal 6 (Week 1): Patient will recall new, daily information with Mod A multimodal cues.   Refer to Care Plan for Long Term Goals  Recommendations for other services: None   Discharge Criteria: Patient will be discharged from SLP if patient refuses treatment 3 consecutive times without medical reason, if treatment goals not met, if there is a change in medical status, if patient makes no progress towards goals or if patient is discharged from hospital.  The above assessment, treatment plan, treatment alternatives and goals were discussed and mutually agreed upon: by patient and by family  Waleska Buttery 03/01/2016, 12:33 PM

## 2016-03-01 NOTE — IPOC Note (Addendum)
Overall Plan of Care [facility]) Patient Details Name: Christina Bradshaw MRN: [id number] DOB: 24-Feb-1924  Admitting Diagnosis: l cva  Hospital Problems: Principal Problem:   Left sided cerebral hemisphere cerebrovascular accident (CVA) (HCC) Active Problems:   Spastic hemiparesis of right dominant side due to cerebral infarction (HCC)   Dysphagia   Essential hypertension   Prediabetes   Hypokalemia   Hypoalbuminemia due to protein-calorie malnutrition (HCC)   Stage 3 chronic kidney disease   Labile blood pressure     Functional Problem List: Nursing Bladder, Bowel, Edema, Endurance, Medication Management, Nutrition, Sensory, Skin Integrity  PT Balance, Endurance, Motor, Perception, Safety, Sensory  OT Balance, Cognition, Endurance, Motor, Pain, Perception, Safety, Sensory, Skin Integrity  SLP Cognition, Linguistic  TR         Basic ADL's: OT Eating, Grooming, Bathing, Dressing, Toileting     Advanced  ADL's: OT       Transfers: PT Bed Mobility, Bed to Chair, Car, Occupational psychologist, Research scientist (life sciences): PT Ambulation, Psychologist, prison and probation services, Stairs     Additional Impairments: OT Fuctional Use of Upper Extremity  SLP Swallowing, Communication, Social Cognition expression Problem Solving, Memory, Attention, Awareness  TR      Anticipated Outcomes Item Anticipated Outcome  Self Feeding Supervision/setup  Swallowing  Supervision with least restrictive diet   Basic self-care  Min assist  Toileting  Min assist   Bathroom Transfers Min assist  Bowel/Bladder  Mod assist  Transfers  Min A  Locomotion  Min A x50'  Communication  Min A  Cognition  Min A   Pain  <3 on a 0-10 scale  Safety/Judgment  mod assist   Therapy Plan: PT Intensity: Minimum of 1-2 x/day ,45 to 90 minutes PT Frequency: 5 out of 7 days PT Duration Estimated Length of Stay: 22-25 days OT Intensity: Minimum of 1-2 x/day, 45 to 90 minutes OT Frequency: 5 out of 7 days OT  Duration/Estimated Length of Stay: 22-25 days SLP Intensity: Minumum of 1-2 x/day, 30 to 90 minutes SLP Frequency: 3 to 5 out of 7 days SLP Duration/Estimated Length of Stay: 3.5 weeks        Team Interventions: Nursing Interventions Patient/Family Education, Bladder Management, Bowel Management, Disease Management/Prevention, Medication Management, Dysphagia/Aspiration Precaution Training, Discharge Planning, Psychosocial Support  PT interventions Ambulation/gait training, Balance/vestibular training, Cognitive remediation/compensation, Discharge planning, Disease management/prevention, DME/adaptive equipment instruction, Functional mobility training, Neuromuscular re-education, Pain management, Patient/family education, Psychosocial support, Skin care/wound management, Splinting/orthotics, Stair training, Therapeutic Activities, Therapeutic Exercise, UE/LE Strength taining/ROM, UE/LE Coordination activities, Visual/perceptual remediation/compensation, Wheelchair propulsion/positioning  OT Interventions Balance/vestibular training, Cognitive remediation/compensation, Discharge planning, Disease mangement/prevention, DME/adaptive equipment instruction, Functional electrical stimulation, Functional mobility training, Neuromuscular re-education, Pain management, Patient/family education, Psychosocial support, Self Care/advanced ADL retraining, Skin care/wound managment, Splinting/orthotics, Therapeutic Activities, Therapeutic Exercise, UE/LE Strength taining/ROM, UE/LE Coordination activities, Visual/perceptual remediation/compensation, Wheelchair propulsion/positioning  SLP Interventions Cognitive remediation/compensation, Cueing hierarchy, Functional tasks, Patient/family education, Therapeutic Activities, Internal/external aids, Speech/Language facilitation, Environmental controls  TR Interventions    SW/CM Interventions Discharge Planning, Psychosocial Support, Patient/Family Education    Team  Discharge Planning: Destination: PT-Home ,OT- Home , SLP-Home Projected Follow-up: PT-Home health PT, 24 hour supervision/assistance, OT-  Home health OT, 24 hour supervision/assistance, SLP-Home Health SLP, 24 hour supervision/assistance Projected Equipment Needs: PT-Rolling walker with 5" wheels, Wheelchair cushion (measurements), Wheelchair (measurements), OT- 3 in 1 bedside comode, Tub/shower bench, To be determined, SLP-To be determined Equipment Details: PT- , OT-  Patient/family involved in discharge planning: PT- Patient, Family  member/caregiver,  OT-Family member/caregiver, Patient, SLP-Patient, Family member/caregiver  MD ELOS: 20-24 days. Medical Rehab Prognosis:  Good Assessment:  83 y.o. female with history of HTN, palpitations, who was admitted on 02/26/15 with compliants of dizziness and speech changes since awakening from sleep. Her symptoms did not improve and [facility] NIHSS 7 and MRI brain done revealing acute infarct left corona radiata extending into putamen and left caudate. MRA negative for stenosis/large vessel occlusion. Carotid dopplers with non-occlusive calcific plaque formation in left carotid bulb and no significant ICA stenosis. 2D echo with EF 60-65% with mild LVH and calcified mitral annulus. She did worsening of weakness overnight with dense right hemiparesis. Dr. Thad Rangereynolds recommends ASA and statin for stroke due to small vessel disease. Cardiac enzymes being monitored and elevation felt to be due to demand ischemia and doubt MI per reports. BSS done revealing signs of aspiration with thin and placed on dysphagia 1, nectar liquids. Therapy evaluations done revealing decreased balance with posterior lean and RLE instability. Will set goals for MinA with PT/OT and SLP.      See Team Conference Notes for weekly updates to the plan of care

## 2016-03-02 ENCOUNTER — Inpatient Hospital Stay (HOSPITAL_COMMUNITY): Payer: Medicare Other | Admitting: Physical Therapy

## 2016-03-02 DIAGNOSIS — R0989 Other specified symptoms and signs involving the circulatory and respiratory systems: Secondary | ICD-10-CM | POA: Insufficient documentation

## 2016-03-02 MED ORDER — RESOURCE THICKENUP CLEAR PO POWD
ORAL | Status: DC | PRN
  Filled 2016-03-02 (×2): qty 125

## 2016-03-02 NOTE — Progress Notes (Addendum)
Physical Therapy Session Note  Patient Details  Name: Christina Bradshaw MRN: [id number] Date of Birth: 02/01/1925  Today's Date: 03/02/2016 PT Individual Time: [id number] PT Individual Time Calculation (min): 43 min    Short Term Goals: Week 1:  PT Short Term Goal 1 (Week 1): Pt will performe supine<>sit with Mod A  PT Short Term Goal 2 (Week 1): Pt will perform squat-pivot transfer with Mod A PT Short Term Goal 3 (Week 1): Pt will perform gait x25' with max A and LRAD PT Short Term Goal 4 (Week 1): Pt will perform WC propulsion x50' with S  Skilled Therapeutic Interventions/Progress Updates:    Pt received in bed, denying c/o pain, & agreeable to tx. Pt noted incontinent BM & performed rolling L/R in bed with use of bed rails to allow therapist to perform peri hygiene & don new brief total assist. Pt able to roll R with min assist but required max assist to roll L. Donned pt's pants, socks & shoes total assist for time management. Educated pt to thread pants on RLE then LLE. Pt required max assist to transfer RLE to EOB & transfer trunk to upright to allow pt to sit at EOB. Pt required max assist to doff dirty shirt & don clean shirt with max cuing for technique. Pt completed multiple squat pivot transfers (to L & R) with max assist and multimodal cuing for anterior weight shift, weight shifting while pivoting, blocking RLE and cuing for technique/hand placement. Pt propelled w/c room<>gym with LLE and supervision. On mat table in gym activity focused on sitting balance while reaching outside of BOS, anterior weight shifting, attention to R, and postural righting reactions (pt retrieved and placed cards on Velcro boards). At end of session pt left sitting in w/c in room with daughter & granddaughter present & all needs within reach.   Addendum: Therapist performed passive stretching to RLE; pt with significant tone in R extremity.   Therapy Documentation Precautions:  Precautions Precautions:  Fall Precaution Comments: dense Rt hemiplegia Restrictions Weight Bearing Restrictions: No   See Function Navigator for Current Functional Status.   Therapy/Group: Individual Therapy  Christina Bradshaw 03/02/2016, 3:46 PM

## 2016-03-02 NOTE — Progress Notes (Signed)
[place] PHYSICAL MEDICINE & REHABILITATION     PROGRESS NOTE  Subjective/Complaints:  Pt laying in bed this AM.  She states she slept well and worked hard in therapies yesterday.   ROS: Denies CP, SOB, N/V/D.  Objective: Vital Signs: Blood pressure (!) 176/78, pulse 69, temperature 98.9 F (37.2 C), temperature source Oral, resp. rate 17, height 5\' 7"  (1.702 m), weight 53.9 kg (118 lb 13.3 oz), SpO2 94 %. No results found.  Recent Labs  03/01/16 0516  WBC 8.4  HGB 14.6  HCT 44.9  PLT 182    Recent Labs  03/01/16 0516  NA 144  K 3.3*  CL 113*  GLUCOSE 121*  BUN 22*  CREATININE 1.07*  CALCIUM 8.4*   CBG (last 3)  No results for input(s): GLUCAP in the last 72 hours.  Wt Readings from Last 3 Encounters:  03/01/16 53.9 kg (118 lb 13.3 oz)  02/27/16 66.5 kg (146 lb 9.7 oz)  10/25/14 67.6 kg (149 lb)    Physical Exam:  BP (!) 176/78 (BP Location: Left Arm)   Pulse 69   Temp 98.9 F (37.2 C) (Oral)   Resp 17   Ht 5\' 7"  (1.702 m)   Wt 53.9 kg (118 lb 13.3 oz)   SpO2 94%   BMI 18.61 kg/m  Constitutional: She appears well-developed and well-nourished.  HENT: Normocephalic and atraumatic.  Eyes: EOM are normal. No discharge.  Cardiovascular: RRR. No JVD. Murmur heard.  Respiratory: Unlabored. Clear GI: Soft. Bowel sounds are normal.  Musculoskeletal: She exhibits no edema. No tenderness Neurological: She is alert, not oriented to date.  Right facial weakness with moderate to severe dysarthria  Able to follow basic commands Dense right hemiparesis with extensor tone RLE and flexor tone RUE.  Motor: RUEprox to distal. RLE: 1/5 HF, 0/5 distally.  Sensation diminished to light touch RUE/RLE Skin: Skin is warm and dry. Intact. Psychiatric: She has a normal mood and affect. Her behavior is normal.   Assessment/Plan: 1. Functional deficits secondary to left corona radiata,putamen,caudate infarct which require 3+ hours per day of interdisciplinary therapy in a  comprehensive inpatient rehab setting. Physiatrist is providing close team supervision and 24 hour management of active medical problems listed below. Physiatrist and rehab team continue to assess barriers to discharge/monitor patient progress toward functional and medical goals.  Function:  Bathing Bathing position   Position: Wheelchair/chair at sink  Bathing parts Body parts bathed by patient: Chest, Abdomen Body parts bathed by helper: Right arm, Left arm, Front perineal area, Buttocks, Right lower leg, Left lower leg, Back, Right upper leg, Left upper leg  Bathing assist Assist Level:  (max assist)      Upper Body Dressing/Undressing Upper body dressing   What is the patient wearing?: Pull over shirt/dress     Pull over shirt/dress - Perfomed by patient: Put head through opening, Thread/unthread left sleeve Pull over shirt/dress - Perfomed by helper: Thread/unthread right sleeve, Pull shirt over trunk        Upper body assist Assist Level:  (Mod assist)      Lower Body Dressing/Undressing Lower body dressing   What is the patient wearing?: Pants, Non-skid slipper socks       Pants- Performed by helper: Thread/unthread right pants leg, Thread/unthread left pants leg, Pull pants up/down   Non-skid slipper socks- Performed by helper: Don/doff right sock, Don/doff left sock                  Lower body  assist Assist for lower body dressing: 2 Helpers      Toileting Toileting Toileting activity did not occur: No continent bowel/bladder event   Toileting steps completed by helper: Adjust clothing prior to toileting, Performs perineal hygiene, Adjust clothing after toileting Toileting Assistive Devices: Grab bar or rail  Toileting assist Assist level: Two helpers   Transfers Chair/bed transfer Chair/bed transfer activity did not occur: N/A Chair/bed transfer method: Squat pivot Chair/bed transfer assist level: Maximal assist (Pt 25 - 49%/lift and  lower) Chair/bed transfer assistive device: Mechanical lift Mechanical lift: Stedy   Locomotion Ambulation     Max distance: 15 Assist level: Maximal assist (Pt 25 - 49%)   Wheelchair   Type: Manual Max wheelchair distance: 35 Assist Level: Moderate assistance (Pt 50 - 74%)  Cognition Comprehension Comprehension assist level: Understands basic 75 - 89% of the time/ requires cueing 10 - 24% of the time  Expression Expression assist level: Expresses basic 50 - 74% of the time/requires cueing 25 - 49% of the time. Needs to repeat parts of sentences.  Social Interaction Social Interaction assist level: Interacts appropriately 75 - 89% of the time - Needs redirection for appropriate language or to initiate interaction.  Problem Solving Problem solving assist level: Solves basic 25 - 49% of the time - needs direction more than half the time to initiate, plan or complete simple activities  Memory Memory assist level: Recognizes or recalls 25 - 49% of the time/requires cueing 50 - 75% of the time    Medical Problem List and Plan: 1. Right hemiparesis and dysarthria/dysphagia secondary to left corona radiata,putamen,caudate infarct on 1/9  Cont CIR 2. DVT Prophylaxis/Anticoagulation: Pharmaceutical: Lovenox  3. Pain Management: tylenol prn  4. Mood:   Has history of anxiety  Started prozac to help with mood and recovery.   LCSW to follow for evaluation and support.  5. Neuropsych: This patient is not fully capable of making decisions on her own behalf.  6. Skin/Wound Care: Routine pressure relief measures. Maintain adequate nutritional and hydration status.  7. Fluids/Electrolytes/Nutrition: Monitor I/O. Supplements between meals.  8. HTN: Monitor BP bid  On Norvasc, lisinopril and metoprolol bid.   Extremely labile, will cont to monitor for persistent elevation while avoid hypotension, may need to change timing of meds, will await AM vitals, will also consider ordering orthostatics 9.  Dysphagia: Continue dysphagia 1, nectar liquids. Offer fluids between meals to maintain adequate hydration.  10. Dyslipidemia: on lipitor  11. Prediabetes: Hgb A1c- 6.0--has been stable.   Modified diet to HH/CM. RD to educate patient and family on appropriate diet.   CBGs overall controlled 1/14 12. GERD: Managed On Protonix.  13. Chronic constipation: Uses softners as well as suppository daily.   Start senna S at supper followed by suppository in am.  14. Emerging right spastic hemiparesis:   Ordered baclofen prn for now.   Resting hand splint and R-AFO.   ROM with therapy.   Reviewed stretching,positioning with family  40. Hypokalemia  K+ 3.3 on 1/13  Supplemented, cont to monitor 16. Hypoalbuminemia  Supplement initiated 1/13 17. CKD  Cr. 1.07 on 1/13  Cont to monitor  LOS (Days) 2 A FACE TO FACE EVALUATION WAS PERFORMED  Kimberlye Dilger Karis Juba 03/02/2016 7:04 AM

## 2016-03-03 ENCOUNTER — Inpatient Hospital Stay (HOSPITAL_COMMUNITY): Payer: Medicare Other | Admitting: Physical Therapy

## 2016-03-03 ENCOUNTER — Inpatient Hospital Stay (HOSPITAL_COMMUNITY): Payer: Medicare Other

## 2016-03-03 ENCOUNTER — Inpatient Hospital Stay (HOSPITAL_COMMUNITY): Payer: Medicare Other | Admitting: Occupational Therapy

## 2016-03-03 ENCOUNTER — Inpatient Hospital Stay (HOSPITAL_COMMUNITY): Payer: Medicare Other | Admitting: Speech Pathology

## 2016-03-03 MED ORDER — AMLODIPINE BESYLATE 10 MG PO TABS
10.0000 mg | ORAL_TABLET | Freq: Every day | ORAL | Status: DC
  Filled 2016-03-03: qty 1

## 2016-03-03 MED ORDER — DOCUSATE SODIUM 100 MG PO CAPS
100.0000 mg | ORAL_CAPSULE | Freq: Every day | ORAL | Status: DC
  Filled 2016-03-03 (×2): qty 1

## 2016-03-03 MED ORDER — AMLODIPINE BESYLATE 10 MG PO TABS
10.0000 mg | ORAL_TABLET | Freq: Every day | ORAL | Status: DC
  Administered 2016-03-03 – 2016-03-26 (×23): 10 mg via ORAL
  Filled 2016-03-03 (×24): qty 1

## 2016-03-03 MED ORDER — TRAZODONE HCL 50 MG PO TABS
50.0000 mg | ORAL_TABLET | Freq: Every day | ORAL | Status: DC
  Administered 2016-03-03 – 2016-03-25 (×22): 50 mg via ORAL
  Filled 2016-03-03 (×25): qty 1

## 2016-03-03 MED ORDER — TRAZODONE HCL 50 MG PO TABS: 25.0000 mg | ORAL_TABLET | Freq: Every day | ORAL | Status: DC

## 2016-03-03 NOTE — Progress Notes (Signed)
[place] PHYSICAL MEDICINE & REHABILITATION     PROGRESS NOTE  Subjective/Complaints:  Pt seen laying in bed this AM.  She slept better overnight. No brace to RLE.  ROS: Denies CP, SOB, N/V/D.  Objective: Vital Signs: Blood pressure (!) 173/75, pulse 70, temperature 98 F (36.7 C), temperature source Oral, resp. rate 18, height 5\' 7"  (1.702 m), weight 53.9 kg (118 lb 13.3 oz), SpO2 98 %. No results found.  Recent Labs  03/01/16 0516  WBC 8.4  HGB 14.6  HCT 44.9  PLT 182    Recent Labs  03/01/16 0516  NA 144  K 3.3*  CL 113*  GLUCOSE 121*  BUN 22*  CREATININE 1.07*  CALCIUM 8.4*   CBG (last 3)  No results for input(s): GLUCAP in the last 72 hours.  Wt Readings from Last 3 Encounters:  03/01/16 53.9 kg (118 lb 13.3 oz)  02/27/16 66.5 kg (146 lb 9.7 oz)  10/25/14 67.6 kg (149 lb)    Physical Exam:  BP (!) 173/75 (BP Location: Left Arm)   Pulse 70   Temp 98 F (36.7 C) (Oral)   Resp 18   Ht 5\' 7"  (1.702 m)   Wt 53.9 kg (118 lb 13.3 oz)   SpO2 98%   BMI 18.61 kg/m  Constitutional: She appears well-developed and well-nourished.  HENT: Normocephalic and atraumatic.  Eyes: EOM are normal. No discharge.  Cardiovascular: RRR. No JVD. Murmur heard.  Respiratory: Unlabored. Clear GI: Soft. Bowel sounds are normal.  Musculoskeletal: She exhibits no edema. No tenderness Neurological: She is alert, not oriented to date.  Right facial weakness with moderate to severe dysarthria  Able to follow basic commands Motor: RUE 0/5 prox to distal.  RLE: 2+/5 HF, 2/5 KE, 0/5 ADF/DP.  Sensation diminished to light touch RUE/RLE Skin: Skin is warm and dry. Intact. Psychiatric: She has a normal mood and affect. Her behavior is normal.   Assessment/Plan: 1. Functional deficits secondary to left corona radiata,putamen,caudate infarct which require 3+ hours per day of interdisciplinary therapy in a comprehensive inpatient rehab setting. Physiatrist is providing close team  supervision and 24 hour management of active medical problems listed below. Physiatrist and rehab team continue to assess barriers to discharge/monitor patient progress toward functional and medical goals.  Function:  Bathing Bathing position   Position: Wheelchair/chair at sink  Bathing parts Body parts bathed by patient: Chest, Abdomen Body parts bathed by helper: Right arm, Left arm, Front perineal area, Buttocks, Right lower leg, Left lower leg, Back, Right upper leg, Left upper leg  Bathing assist Assist Level:  (max assist)      Upper Body Dressing/Undressing Upper body dressing   What is the patient wearing?: Pull over shirt/dress     Pull over shirt/dress - Perfomed by patient: Put head through opening, Thread/unthread left sleeve Pull over shirt/dress - Perfomed by helper: Thread/unthread right sleeve, Pull shirt over trunk        Upper body assist Assist Level:  (Mod assist)      Lower Body Dressing/Undressing Lower body dressing   What is the patient wearing?: Pants, Non-skid slipper socks       Pants- Performed by helper: Thread/unthread right pants leg, Thread/unthread left pants leg, Pull pants up/down   Non-skid slipper socks- Performed by helper: Don/doff right sock, Don/doff left sock                  Lower body assist Assist for lower body dressing: 2 Helpers  Toileting Toileting Toileting activity did not occur: No continent bowel/bladder event   Toileting steps completed by helper: Adjust clothing prior to toileting, Performs perineal hygiene, Adjust clothing after toileting Toileting Assistive Devices: Grab bar or rail  Toileting assist Assist level: Two helpers   Transfers Chair/bed transfer Chair/bed transfer activity did not occur: N/A Chair/bed transfer method: Squat pivot Chair/bed transfer assist level: Maximal assist (Pt 25 - 49%/lift and lower) Chair/bed transfer assistive device: Armrests Mechanical lift: Armed forces operational officertedy    Locomotion Ambulation     Max distance: 15 Assist level: Maximal assist (Pt 25 - 49%)   Wheelchair   Type: Manual Max wheelchair distance: 70 ft (LLE) Assist Level: Supervision or verbal cues  Cognition Comprehension Comprehension assist level: Understands basic 75 - 89% of the time/ requires cueing 10 - 24% of the time  Expression Expression assist level: Expresses basic 50 - 74% of the time/requires cueing 25 - 49% of the time. Needs to repeat parts of sentences.  Social Interaction Social Interaction assist level: Interacts appropriately 75 - 89% of the time - Needs redirection for appropriate language or to initiate interaction.  Problem Solving Problem solving assist level: Solves basic 25 - 49% of the time - needs direction more than half the time to initiate, plan or complete simple activities  Memory Memory assist level: Recognizes or recalls 25 - 49% of the time/requires cueing 50 - 75% of the time    Medical Problem List and Plan: 1. Right hemiparesis and dysarthria/dysphagia secondary to left corona radiata,putamen,caudate infarct on 1/9  Cont CIR 2. DVT Prophylaxis/Anticoagulation: Pharmaceutical: Lovenox  3. Pain Management: tylenol prn  4. Mood:   Has history of anxiety  Started prozac to help with mood and recovery.   LCSW to follow for evaluation and support.  5. Neuropsych: This patient is not fully capable of making decisions on her own behalf.  6. Skin/Wound Care: Routine pressure relief measures. Maintain adequate nutritional and hydration status.  7. Fluids/Electrolytes/Nutrition: Monitor I/O. Supplements between meals.  8. HTN: Monitor BP bid  Norvasc increased to 10mg  on 1/15  Cont lisinopril and metoprolol bid.  9. Dysphagia: Continue dysphagia 1, nectar liquids. Offer fluids between meals to maintain adequate hydration.  10. Dyslipidemia: on lipitor  11. Prediabetes: Hgb A1c- 6.0.   Modified diet to HH/CM. RD to educate patient and family on appropriate  diet.   CBGs overall controlled 1/15 12. GERD: Managed On Protonix.  13. Chronic constipation: Uses softners as well as suppository daily.   Start senna S at supper followed by suppository in am.  14. Emerging right spastic hemiparesis:   Ordered baclofen prn for now.   Resting hand splint and R-AFO.   ROM with therapy.   Reviewed stretching,positioning with family  415. Hypokalemia  K+ 3.3 on 1/13  Labs ordered for tomorrow  Supplemented, cont to monitor 16. Hypoalbuminemia  Supplement initiated 1/13 17. CKD  Cr. 1.07 on 1/13  Labs ordered for tomorrow  Cont to monitor  LOS (Days) 3 A FACE TO FACE EVALUATION WAS PERFORMED  Ankit Karis Jubanil Patel 03/03/2016 8:04 AM

## 2016-03-03 NOTE — Progress Notes (Signed)
Speech Language Pathology Daily Session Note  Patient Details  Name: Christina Bradshaw MRN: [id number] Date of Birth: 1924-03-15  Today's Date: 03/03/2016 SLP Individual Time: 1435-1520 SLP Individual Time Calculation (min): 45 min   Short Term Goals: Week 1: SLP Short Term Goal 1 (Week 1): Patient will consume current diet with minimal overt s/s of aspiration with Mod A verbal cues for use of swallowing compensatory strategies.  SLP Short Term Goal 2 (Week 1): Patient will consume trials of nectar-thick liquids via cup with minimal overt s/s of aspiration over 2 sessions prior to upgrade.  SLP Short Term Goal 3 (Week 1): Patient will utilize speech intelligibility strategies at the phrase level with Mod A multimodal cues.  SLP Short Term Goal 4 (Week 1): Patient will utilize word-finding strateiges during functional tasks/conversations with Min A multimodal cues.  SLP Short Term Goal 5 (Week 1): Patient will demonstrate functional problem solving for basic and familiar tasks with Mod A multimodal cues.  SLP Short Term Goal 6 (Week 1): Patient will recall new, daily information with Mod A multimodal cues.   Skilled Therapeutic Interventions:   Skilled treatment session focused on addressing dysphagia goals. SLP facilitated session by providing skilled observation of nectar-thick liquids via spoon and cup which resulted in immediate overt s/s of aspiration due to suspected delay in boluses larger than 1/2 a teaspoon size.  Honey-thick liquids via cup and spoon resulted in no overt s/s of aspiration with Min verbal cues for a slow pace; however, when pacing cues were faded rate increased and reflexive cough noted x1.  SLP initiated education regarding performance of chin tuck with resistance hard effortful swallow with patient completing x2 with extra time.  Encouraged her to carryover throughout the day.  Notified RN and PA of downgrade.  Family also present for today's session and education  initiated with them to provide full supervision with meals.     Function:  Eating Eating   Modified Consistency Diet: Yes Eating Assist Level: Set up assist for;More than reasonable amount of time;Helper checks for pocketed food;Supervision or verbal cues   Eating Set Up Assist For: Opening containers       Cognition Comprehension Comprehension assist level: Understands basic 75 - 89% of the time/ requires cueing 10 - 24% of the time  Expression   Expression assist level: Expresses basic 50 - 74% of the time/requires cueing 25 - 49% of the time. Needs to repeat parts of sentences.  Social Interaction Social Interaction assist level: Interacts appropriately 75 - 89% of the time - Needs redirection for appropriate language or to initiate interaction.  Problem Solving Problem solving assist level: Solves basic 25 - 49% of the time - needs direction more than half the time to initiate, plan or complete simple activities  Memory Memory assist level: Recognizes or recalls 25 - 49% of the time/requires cueing 50 - 75% of the time    Pain Pain Assessment Pain Assessment: No/denies pain  Therapy/Group: Individual Therapy  Christina FerrettiMelissa Marleny Bradshaw, M.A., CCC-SLP [phone]  Christina Bradshaw 03/03/2016, 4:40 PM

## 2016-03-03 NOTE — Progress Notes (Signed)
Physical Therapy Session Note  Patient Details  Name: Christina Bradshaw MRN: [id number] Date of Birth: 13-Sep-1924  Today's Date: 03/03/2016 PT Individual Time: 0905-1003 PT Individual Time Calculation (min): 58 min    Short Term Goals: Week 1:  PT Short Term Goal 1 (Week 1): Pt will performe supine<>sit with Mod A  PT Short Term Goal 2 (Week 1): Pt will perform squat-pivot transfer with Mod A PT Short Term Goal 3 (Week 1): Pt will perform gait x25' with max A and LRAD PT Short Term Goal 4 (Week 1): Pt will perform WC propulsion x50' with S  Skilled Therapeutic Interventions/Progress Updates:   Pt presented in bed agreeable to therapy.  MaxA log rolling L/R for peri-care, maxA donning pants for time management. MaxA supine to sit with cues for use of bed rail and HOB elevated. MaxA squat pivot transfer to w/c to R. Nsg present to administer meds. Pt propelled in hallways approx 13300ft with hemi technique. Pt able to successfully negotiate turns and around obstacles at supervision level with min cues. Performed wt shifting activities at hall rail (x3) with standing tolerance up to 2 minutes. Cues for erect posture, increasing wt shift to R for facilitation of gait. Pt able to propel back to room un same manner and left in w/c with half lap tray in place and call bell within reach.   Therapy Documentation Precautions:  Precautions Precautions: Fall Precaution Comments: dense Rt hemiplegia Restrictions Weight Bearing Restrictions: No General:        See Function Navigator for Current Functional Status.   Therapy/Group: Individual Therapy  Trista Ciocca  Jonathen Rathman, PTA  03/03/2016, 12:16 PM

## 2016-03-03 NOTE — PMR Pre-admission (Signed)
[facility] PMR Admission Coordinator Pre-Admission Assessment  Patient: Christina Bradshaw is an 83 y.o., female MRN: [id number] DOB: [birth year] Height: 5\' 7"  (170.2 cm) Weight: 66.5 kg (146 lb 9.7 oz)  Insurance Information HMO: No   PPO:       PCP:       IPA:       80/20:       OTHER:   PRIMARY:  Medicare A/B      Policy#:  [id number]      Subscriber: Richardine ServiceMildred Spark CM Name:        Phone#:       Fax#:   Pre-Cert#:        Employer: Retired  Benefits:  Phone #:       [place]: 12[place]19/91     Deduct: $1340      Out of Pocket Max: none      Life Max: unlimited CIR: 100%      SNF: 100 days Outpatient: 80%     Co-Pay: 20% Home Health: 100%      Co-Pay: none DME: 80%     Co-Pay: 20% Providers: patient's choice  SECONDARY: [employer]      Policy#: [id number]      Subscriber: Richardine ServiceMildred Korte CM Name:        Phone#:       Fax#:   Pre-Cert#:        Employer:   Benefits:  Phone #: [phone]     Name:   Eff. Date:       Deduct:        Out of Pocket Max:        Life Max:   CIR:        SNF:   Outpatient:       Co-Pay:   Home Health:        Co-Pay:   DME:       Co-Pay:    Emergency Contact Information        Contact Information    Name Relation Home Work Mobile   WadenaDotson,[name] H Daughter [phone]     Andee PolesHinshaw,Carl Son [phone]     Bretta BangHinshaw,Mike  [phone]        Current Medical History  Patient Admitting Diagnosis: L CVA  History of Present Illness: An 83 y.o.female with history of HTN who was admitted on 02/26/16 with complaints of dizziness and speech changes since awakening from sleep. Her symptoms did not improve and [facility]. NIHSS 7 and MRI brain done revealing acute infarct left corona extending into putamen and left caudate. MRA negative for stenosis/large vessel occlusion. Carotid dopplers with non-occlusive calcific plaque formation in left carotid bulb and no significant ICA stenosis. 2D echo with EF  60-65% with mild LVH and calcified mitral annulus. She did have worsening of weakness overnight with dense right hemiparesis. Dr. Thad Rangereynolds recommends ASA and statin for stroke due to small vessel disease.   an 83 y.o.female with history of HTN who was admitted on 02/26/15 with compliants of dizziness and speech changes since awakening from sleep. Her symptoms did not improve [facility] NIHSS 7 and MRI brain done revealing acute infarct left corona extending into putamen and left caudate. MRA negative for stenosis/large vessel occlusion. Carotid dopplers with non-occlusive calcific plaque formation in left carotid bulb and no significant ICA stenosis. 2D echo with EF 60-65% with mild LVH and calcified mitral annulus. an 83  y.o.female with history of HTN who was admitted on 02/26/15 with compliants of dizziness and speech changes since awakening from sleep. Her symptoms did not improve and was taken to [facility] ED by family. NIHSS 7 and MRI brain done revealing acute infarct left corona extending into putamen and left caudate. MRA negative for stenosis/large vessel occlusion. Carotid dopplers with non-occlusive calcific plaque formation in left carotid bulb and no significant ICA stenosis. 2D echo with EF 60-65% with mild LVH and calcified mitral annulus. She did worsening of weakness overnight with dense right hemiparesis. Dr. Thad Ranger recommends ASA and statin for stroke due to small vessel disease.  BSS done revealing signs of aspiration with thin and placed on dysphagia 1, nectar liquids. She did worsening of weakness overnight with dense right hemiparesis. Dr. Thad Ranger recommends ASA and statin for stroke due to small vessel disease.   Patient's medical record from [facility] has been reviewed by the rehabilitation admission coordinator and physician.  NIH Stroke scale: 7  Past Medical History      Past Medical History:  Diagnosis Date  . Hypertension    . Stroke [facility])     Family History   family history is not on file.  Prior Rehab/Hospitalizations Has the patient had major surgery during 100 days prior to admission? No              Current Medications See MAR from [facility]  Patients Current Diet:  Dys 1, nectar thick liquids  Precautions / Restrictions Precautions Precautions: Fall Precaution Comments: Aspiration; Seizure Restrictions Weight Bearing Restrictions: No   Has the patient had 2 or more falls or a fall with injury in the past year?No  Prior Activity Level Limited Community (1-2x/wk): Went to church on Sundays, to grocery store.  Did not drive herself.  Walked daily to [employer] with her cane.  Prior Functional Level Self Care: Did the patient need help bathing, dressing, using the toilet or eating?  Independent  Indoor Mobility: Did the patient need assistance with walking from room to room (with or without device)? Independent  Stairs: Did the patient need assistance with internal or external stairs (with or without device)? Independent  Functional Cognition: Did the patient need help planning regular tasks such as shopping or remembering to take medications? Independent  Home Assistive Devices / Equipment Home Assistive Devices/Equipment: Cane (specify quad or straight) (straight) Home Equipment: Cane - single point, Cane - quad  Prior Device Use: Indicate devices/aids used by the patient prior to current illness, exacerbation or injury? Straight cane.  Used it to walk to and from the mail box daily.   Prior Functional Level Current Functional Level  Bed Mobility Independent Mod assist  Transfers Independent Mod assist  Mobility - Walk/Wheelchair Independent Mod assist (Amb 3 ft +2 mod assist hemiwalker.)  Upper Body Dressing Independent Mod assist  Lower Body Dressing Independent Max assist  Grooming Independent Other (Set up)  Eating/Drinking Independent Min  assist  Toilet Transfer Independent Mod assist  Bladder Continence  WDL Incontinence, using bedpan  Bowel Management WDL NO BM since 02/26/16  Stair Climbing Independent Other (Not tried.)  Communication Intact Dysarthria, low volume  Memory Intact Confusion  Cooking/Meal Prep Independent     Housework Independent   Money Management Independent   Driving No, does not drive.     Special needs/care consideration BiPAP/CPAP No CPM No Continuous Drip IV 0.9% NS at 75 mL/hr Dialysis No       Life  Vest No Oxygen No Special Bed No Trach Size No Wound Vac (area) No    Skin Had some facial precancer spots removed in the past                             Bowel mgmt: Last BM 02/26/16 Bladder mgmt: Voiding on bedpan with incontinence at times Diabetic mgmt No  Previous Home Environment Living Arrangements: Alone  Lives With: Family Available Help at Discharge: Family, Available PRN/intermittently Type of Home: House Home Layout: Two level, Able to live on main level with bedroom/bathroom Home Access: Stairs to enter Entrance Stairs-Rails: Left Entrance Stairs-Number of Steps: 5 STE Bathroom Shower/Tub: Engineer, manufacturing systems: Standard Bathroom Accessibility: Yes How Accessible: Accessible via walker Home Care Services: No  Discharge Living Setting Plans for Discharge Living Setting: Patient's home, House (Will have help at time of discharge.  Lives alone.) Type of Home at Discharge: House Discharge Home Layout: Two level, Able to live on main level with bedroom/bathroom Alternate Level Stairs-Number of Steps: Flight - does not have to go upstairs Discharge Home Access: Stairs to enter Entrance Stairs-Number of Steps: 5 steps Does the patient have any problems obtaining your medications?: No  Social/Family/Support Systems Patient Roles: Parent (Has 1 dtr and 2 sons.) Contact Information: Family to share caregiver role, hire help if needed. Anticipated Caregiver:  Vivianne Spence - dtr Anticipated Caregiver's Contact Information: Burt Ek - [phone] Ability/Limitations of Caregiver: Dtr and sons work. Caregiver Availability: Other (Comment) (Family aware of need for 24/7 assistance after rehab.) Discharge Plan Discussed with Primary Caregiver: Yes Is Caregiver In Agreement with Plan?: Yes Does Caregiver/Family have Issues with Lodging/Transportation while Pt is in Rehab?: No  Goals/Additional Needs Patient/Family Goal for Rehab: PT/OT min assist, SLP mod I and supervision goals Expected length of stay: 14-18 days Cultural Considerations: [employer] Dietary Needs: Dys 1, nectar thick liquids Equipment Needs: TBD Pt/Family Agrees to Admission and willing to participate: Yes Program Orientation Provided & Reviewed with Pt/Caregiver Including Roles  & Responsibilities: Yes  Patient Condition:  I have reviewed all notes in the medical record and have spoken with patient's daughter.  Patient lived alone and was independent PTA.  Family understands that patient will need supervision post rehab discharge.  Patient will benefit and can tolerate 3 hours of therapy a day.  Family all in agreement to acute inpatient rehab admission here at [facility].  I have reviewed all progress with rehab MD and I have approval for acute inpatient rehab admission for today.  Preadmission Screen Completed By:  Trish Mage, 02/28/2016 2:40 PM ______________________________________________________________________   Discussed status with Dr. Riley Kill on 02/29/16 at 0919 and received telephone approval for admission today.  Admission Coordinator:  Trish Mage, time 0919/Date 02/29/16   Assessment/Plan:  Diagnosis: left subcortical infarct 1. Does the need for close, 24 hr/day  Medical supervision in concert with the patient's rehab needs make it unreasonable for this patient to be served in a less intensive setting? Yes 2. Co-Morbidities requiring  supervision/potential complications: htn 3. Due to bladder management, bowel management, safety, skin/wound care, disease management, medication administration, pain management and patient education, does the patient require 24 hr/day rehab nursing? Yes 4. Does the patient require coordinated care of a physician, rehab nurse, PT (1-2 hrs/day, 5 days/week), OT (1-2 hrs/day, 5 days/week) and SLP (1-2 hrs/day, 5 days/week) to address physical and functional deficits in the context of the above  medical diagnosis(es)? Yes Addressing deficits in the following areas: balance, endurance, locomotion, strength, transferring, bowel/bladder control, bathing, dressing, feeding, grooming, toileting, cognition, speech and psychosocial support 5. Can the patient actively participate in an intensive therapy program of at least 3 hrs of therapy 5 days a week? Yes 6. The potential for patient to make measurable gains while on inpatient rehab is excellent 7. Anticipated functional outcomes upon discharge from inpatients are: min assist PT, min assist OT, modified independent and supervision SLP 8. Estimated rehab length of stay to reach the above functional goals is: 14-18 days 9. Does the patient have adequate social supports to accommodate these discharge functional goals? Yes 10. Anticipated D/C setting: Home 11. Anticipated post D/C treatments: HH therapy and Outpatient therapy 12. Overall Rehab/Functional Prognosis: excellent    RECOMMENDATIONS: This patient's condition is appropriate for continued rehabilitative care in the following setting: CIR Patient has agreed to participate in recommended program. Yes Note that insurance prior authorization may be required for reimbursement for recommended care.  Comment:  Pt to be admitted to inpatient rehab today. [name], MD, [facility] Physical Medicine & Rehabilitation 02/29/2016   Trish Mage 02/28/2016  This note was edited  today to reflect correct calendar year 2018 [name], MD, [facility] Physical Medicine & Rehabilitation 03/03/2016

## 2016-03-03 NOTE — Progress Notes (Signed)
Physical Therapy Session Note  Patient Details  Name: Christina LungMildred C Sheerin MRN: [id number] Date of Birth: 06/20/1924  Today's Date: 03/03/2016 PT Individual Time: 1300-1330 PT Individual Time Calculation (min): 30 min    Short Term Goals: Week 1:  PT Short Term Goal 1 (Week 1): Pt will performe supine<>sit with Mod A  PT Short Term Goal 2 (Week 1): Pt will perform squat-pivot transfer with Mod A PT Short Term Goal 3 (Week 1): Pt will perform gait x25' with max A and LRAD PT Short Term Goal 4 (Week 1): Pt will perform WC propulsion x50' with S  Skilled Therapeutic Interventions/Progress Updates:    Session focused on neuro re-ed to address sit <> stands, gait in hallway with rail with focus on facilitation of weightshift and postural control (total A to advance RLE), and postural control and equal weightbearing in standing. Pt requires max assist overall for sit <> stand and gait. Pt able to maintain static standing with min assist with LUE support on rail for about 30 seconds. Pt's son present during session and reports he will be providing assist upon discharge.    Therapy Documentation Precautions:  Precautions Precautions: Fall Precaution Comments: dense Rt hemiplegia Restrictions Weight Bearing Restrictions: No   Pain:  Denies pain.    See Function Navigator for Current Functional Status.   Therapy/Group: Individual Therapy  Karolee StampsGray, Amarah Brossman [name]  Nevaeha Finerty B. Deandrea Rion, PT, DPT  03/03/2016, 2:11 PM

## 2016-03-03 NOTE — Progress Notes (Signed)
Social Work  Social Work Assessment and Plan  Patient Details  Name: Christina Bradshaw MRN: [id number] Date of Birth: [birth year]  Today's Date: 03/03/2016  Problem List:  Patient Active Problem List   Diagnosis Date Noted  . Labile blood pressure   . Prediabetes   . Hypokalemia   . Hypoalbuminemia due to protein-calorie malnutrition (HCC)   . Stage 3 chronic kidney disease   . Left sided cerebral hemisphere cerebrovascular accident (CVA) (HCC) 02/29/2016  . Spastic hemiparesis of right dominant side due to cerebral infarction (HCC) 02/29/2016  . Dysphagia 02/29/2016  . Essential hypertension 02/29/2016  . TIA (transient ischemic attack) 02/27/2016  . CVA (cerebral vascular accident) (HCC) 02/27/2016   Past Medical History:  Past Medical History:  Diagnosis Date  . Hypertension   . Stroke [facility])    Past Surgical History:  Past Surgical History:  Procedure Laterality Date  . ABDOMINAL HYSTERECTOMY     Social History:  reports that she has never smoked. She has never used smokeless tobacco. She reports that she does not drink alcohol or use drugs.  Family / Support Systems Marital Status: Widow/Widower How Long?: 9 yrs Patient Roles: Parent Children: daughter, Vivianne Spence @ [phone];  son, Laurinda Carreno @ [phone] and son, Kathlene November @ [phone] Anticipated Caregiver: Vivianne Spence - dtr Ability/Limitations of Caregiver: Dtr and sons work.  Daughter states, "Verlon Au going to make it work." Medical laboratory scientific officer: 24/7 (family aware need to coordinate 24/7 assist at time of admission consult) Family Dynamics: Two children in the room during the interivew and very supportive and encouraging to pt.  There was good communication between them with daughter being primary contact.  Social History Preferred language: English Religion: Unknown Cultural Background: NA Education: HS Read: Yes Write: Yes Employment Status: Retired Date Retired/Disabled/Unemployed: Never worked  a paid job.  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 Legal Hisotry/Current Legal Issues: None Guardian/Conservator: Daughter is pt's HCPOA with two sons as 2ndary   Abuse/Neglect Physical Abuse: Denies Verbal Abuse: Denies Sexual Abuse: Denies Exploitation of patient/patient's resources: Denies Self-Neglect: Denies  Emotional Status Pt's affect, behavior adn adjustment status: Pt appears very fatigued and attempts to answer questions.  Voice is very soft and offers one word responses.  Responds minimally to humor. Recent Psychosocial Issues: None Pyschiatric History: None Substance Abuse History: None  Patient / Family Perceptions, Expectations & Goals Pt/Family understanding of illness & functional limitations: Pt and family with good understanding of her CVA and current functional limitations/ need for CIR. Premorbid pt/family roles/activities: Pt was completely independent PTA.  Has never really driven as children laugh when they report "she hasn't driven a car since the 50"s" Anticipated changes in roles/activities/participation: Initial goals are being set for minimal assistance.  Anticipating children will share in caregiver role. Pt/family expectations/goals: per daughter, "we're just getting started...hope she can make a lot of progress here..."  [employer]: None Premorbid Home Care/DME Agencies: None Transportation available at discharge: yes Resource referrals recommended: Neuropsychology, Support group (specify)  Discharge Planning Living Arrangements: Alone Support Systems: Children Type of Residence: Private residence Insurance Resources: [employer], Media planner (specify) Building services engineer) Financial Resources: Restaurant manager, fast food Screen Referred: No Living Expenses: Own Money Management: Patient Does the patient have any problems obtaining your medications?: No Home Management: pt Patient/Family Preliminary  Plans: Pt and family have goal of her being able to return to her home with family (and possible private duty assist) providing 24/7 care. Social Work Anticipated  Follow Up Needs: HH/OP Expected length of stay: 22-25 days  Clinical Impression Elderly woman who has been in excellent health until this CVA.  Was completely independent and living alone.  4 adult children in the area who are working to coordinate how they will cover 24/7 assistance.  Goals set for min assist.  Pt denies any emotional distress, however, admits frustration with her loss of independence.  May benefit from referral for neuropsychology at some point.  Will follow for support and d/c planning needs.  Olson Lucarelli 03/03/2016, 3:38 PM

## 2016-03-03 NOTE — Care Management Note (Signed)
[facility] Individual Statement of Services  Patient Name:  Christina LungMildred C Bradshaw  Date:  03/03/2016  Welcome to the [facility].  Our goal is to provide you with an individualized program based on your diagnosis and situation, designed to meet your specific needs.  With this comprehensive rehabilitation program, you will be expected to participate in at least 3 hours of rehabilitation therapies Monday-Friday, with modified therapy programming on the weekends.  Your rehabilitation program will include the following services:  Physical Therapy (PT), Occupational Therapy (OT), Speech Therapy (ST), 24 hour per day rehabilitation nursing, Therapeutic Recreaction (TR), Neuropsychology, Case Management (Social Worker), Rehabilitation Medicine, Nutrition Services and Pharmacy Services  Weekly team conferences will be held on Wednesdays to discuss your progress.  Your Social Worker will talk with you frequently to get your input and to update you on team discussions.  Team conferences with you and your family in attendance may also be held.  Expected length of stay: 3 weeks  Overall anticipated outcome: minimal assistance  Depending on your progress and recovery, your program may change. Your Social Worker will coordinate services and will keep you informed of any changes. Your Social Worker's name and contact numbers are listed  below.  The following services may also be recommended but are not provided by the [facility]:   Driving Evaluations  Home Health Rehabiltiation Services  Outpatient Rehabilitation Services    Arrangements will be made to provide these services after discharge if needed.  Arrangements include referral to agencies that provide these services.  Your insurance has been verified to be:  Medicare and AARP Your primary doctor is:  Dr.Bronstein  Pertinent information will be shared with your doctor and your insurance  company.  Social Worker:  [place]cy Mizani Bradshaw, [place] [phone] or [phone]) [phone]   Information discussed with and copy given to patient by: Amada JupiterHOYLE, Christina Bradshaw, 03/03/2016, 3:46 PM

## 2016-03-03 NOTE — Progress Notes (Signed)
Occupational Therapy Session Note  Patient Details  Name: Christina Bradshaw MRN: [id number] Date of Birth: 1924-02-27  Today's Date: 03/03/2016 OT Individual Time: [id number] OT Individual Time Calculation (min): 57 min   Short Term Goals: Week 1:  OT Short Term Goal 1 (Week 1): Pt will complete sit > stand with max assist to complete LB dressing/hygiene OT Short Term Goal 2 (Week 1): Pt will complete toilet transfers with max assist 3 out of 4 attempts OT Short Term Goal 3 (Week 1): Pt will don pants with max assist of one caregiver OT Short Term Goal 4 (Week 1): Pt will position RUE during self-care tasks with min cues to increase awareness of positioning and decrease injury to shoulder    Skilled Therapeutic Interventions/Progress Updates:   Skilled OT session completed with focus on R UE NMR/positioning, activity tolerance, and postural control. Pt self propelled to dayroom with hemi technique and extra time. She engaged in towel slides with active assist, focusing on shoulder protraction/retraction, IR/ER, and circumduction to improve R UE shoulder stability. Education emphasis on safe handling/positioning of R UE during exercises. Afterwards she completed squat pivot to mat with Max A and instruction on head/hip relationships with manual facilitation for R UE/LE placement and weight shifting. [facility] through R UE involving dynamic reaching out of base of support, lateral weight shifts, and crossing midline to improve trunk strength/posture. Pt exhibited 3 small LOBs to right side, able to self correct 2/3 times without therapist assist. Meaning[facility] and problem solving. Pt then transferred back to w/c in manner as written above, self propelled back to room and able to pathfind her way with min cues. She was left with caregiver and all needs within reach at time of departure with half lap tray applied to w/c.    Therapy Documentation Precautions:  Precautions Precautions: Fall Precaution Comments: dense Rt hemiplegia Restrictions Weight Bearing Restrictions: No General:   Vital Signs: Therapy Vitals BP: (!) 170/82 Pain: No c/o pain during session    ADL:      See Function Navigator for Current Functional Status.   Therapy/Group: Individual Therapy  Tamicka Shimon A Patryk Conant 03/03/2016, 12:49 PM

## 2016-03-04 ENCOUNTER — Inpatient Hospital Stay (HOSPITAL_COMMUNITY): Payer: Medicare Other | Admitting: Physical Therapy

## 2016-03-04 ENCOUNTER — Inpatient Hospital Stay (HOSPITAL_COMMUNITY): Payer: Medicare Other | Admitting: Speech Pathology

## 2016-03-04 ENCOUNTER — Inpatient Hospital Stay (HOSPITAL_COMMUNITY): Payer: Medicare Other | Admitting: Occupational Therapy

## 2016-03-04 DIAGNOSIS — N179 Acute kidney failure, unspecified: Secondary | ICD-10-CM

## 2016-03-04 DIAGNOSIS — E870 Hyperosmolality and hypernatremia: Secondary | ICD-10-CM

## 2016-03-04 DIAGNOSIS — D72829 Elevated white blood cell count, unspecified: Secondary | ICD-10-CM

## 2016-03-04 LAB — CBC WITH DIFFERENTIAL/PLATELET
BASOS ABS: 0 10*3/uL (ref 0.0–0.1)
BASOS PCT: 0 %
EOS ABS: 0.1 10*3/uL (ref 0.0–0.7)
EOS PCT: 1 %
HCT: 44.3 % (ref 36.0–46.0)
Hemoglobin: 14.5 g/dL (ref 12.0–15.0)
Lymphocytes Relative: 18 %
Lymphs Abs: 2 10*3/uL (ref 0.7–4.0)
MCH: 31.7 pg (ref 26.0–34.0)
MCHC: 32.7 g/dL (ref 30.0–36.0)
MCV: 96.7 fL (ref 78.0–100.0)
MONO ABS: 1 10*3/uL (ref 0.1–1.0)
Monocytes Relative: 9 %
NEUTROS ABS: 7.9 10*3/uL — AB (ref 1.7–7.7)
Neutrophils Relative %: 72 %
PLATELETS: 198 10*3/uL (ref 150–400)
RBC: 4.58 MIL/uL (ref 3.87–5.11)
RDW: 15.2 % (ref 11.5–15.5)
WBC: 11 10*3/uL — ABNORMAL HIGH (ref 4.0–10.5)

## 2016-03-04 LAB — BASIC METABOLIC PANEL
ANION GAP: 8 (ref 5–15)
BUN: 47 mg/dL — AB (ref 6–20)
CALCIUM: 8.7 mg/dL — AB (ref 8.9–10.3)
CO2: 24 mmol/L (ref 22–32)
CREATININE: 1.24 mg/dL — AB (ref 0.44–1.00)
Chloride: 116 mmol/L — ABNORMAL HIGH (ref 101–111)
GFR calc Af Amer: 43 mL/min — ABNORMAL LOW (ref >60)
GFR, EST NON AFRICAN AMERICAN: 37 mL/min — AB (ref >60)
GLUCOSE: 109 mg/dL — AB (ref 65–99)
Potassium: 3.2 mmol/L — ABNORMAL LOW (ref 3.5–5.1)
Sodium: 148 mmol/L — ABNORMAL HIGH (ref 135–145)

## 2016-03-04 MED ORDER — SODIUM CHLORIDE 0.9 % IV SOLN: INTRAVENOUS | Status: DC

## 2016-03-04 MED ORDER — POTASSIUM CHLORIDE CRYS ER 10 MEQ PO TBCR: 30.0000 meq | EXTENDED_RELEASE_TABLET | Freq: Two times a day (BID) | ORAL | Status: DC

## 2016-03-04 MED ORDER — POTASSIUM CHLORIDE 20 MEQ PO PACK: 20.0000 meq | PACK | Freq: Every day | ORAL | Status: DC

## 2016-03-04 MED ORDER — POTASSIUM CHLORIDE 20 MEQ PO PACK
30.0000 meq | PACK | Freq: Two times a day (BID) | ORAL | Status: DC
  Administered 2016-03-04: 30 meq via ORAL
  Filled 2016-03-04 (×2): qty 2

## 2016-03-04 MED ORDER — ENOXAPARIN SODIUM 30 MG/0.3ML ~~LOC~~ SOLN
30.0000 mg | SUBCUTANEOUS | Status: DC
  Administered 2016-03-04 – 2016-03-25 (×22): 30 mg via SUBCUTANEOUS
  Filled 2016-03-04 (×22): qty 0.3

## 2016-03-04 MED ORDER — SODIUM CHLORIDE 0.45 % IV SOLN
INTRAVENOUS | Status: DC
  Administered 2016-03-04 – 2016-03-16 (×12): via INTRAVENOUS
  Filled 2016-03-04 (×14): qty 800

## 2016-03-04 MED ORDER — POTASSIUM CHLORIDE 20 MEQ/15ML (10%) PO SOLN
30.0000 meq | Freq: Two times a day (BID) | ORAL | Status: DC
  Administered 2016-03-04 – 2016-03-09 (×10): 30 meq via ORAL
  Filled 2016-03-04 (×10): qty 30

## 2016-03-04 NOTE — Progress Notes (Signed)
Speech Language Pathology Daily Session Note  Patient Details  Name: Christina Bradshaw MRN: [id number] Date of Birth: [birth year]  Today's Date: 03/04/2016 SLP Individual Time: 0830-0930 SLP Individual Time Calculation (min): 60 min   Short Term Goals: Week 1: SLP Short Term Goal 1 (Week 1): Patient will consume current diet with minimal overt s/s of aspiration with Mod A verbal cues for use of swallowing compensatory strategies.  SLP Short Term Goal 2 (Week 1): Patient will consume trials of nectar-thick liquids with minimal overt s/s of aspiration over 2 sessions prior to upgrade or repeat objective assessment.  SLP Short Term Goal 3 (Week 1): Patient will utilize speech intelligibility strategies at the phrase level with Mod A multimodal cues.  SLP Short Term Goal 4 (Week 1): Patient will utilize word-finding strateiges during functional tasks/conversations with Min A multimodal cues.  SLP Short Term Goal 5 (Week 1): Patient will demonstrate functional problem solving for basic and familiar tasks with Mod A multimodal cues.  SLP Short Term Goal 6 (Week 1): Patient will recall new, daily information with Mod A multimodal cues.   Skilled Therapeutic Interventions:   Skilled treatment session focused on addressing speech and swallow goals. SLP facilitated session by providing set-up and skilled observation of patient consuming Dys.1 textures and honey-thick liquids via cup with Supervision level verbal cues for lingual sweeps with no overt s/s of aspiration.  SLP also facilitated session with Max assist verbal cue for pacing and increased vocal intensity to achieve intelligibly at the single word level.  Continue with current plan of care.    Function:  Eating Eating   Modified Consistency Diet: Yes Eating Assist Level: Set up assist for;More than reasonable amount of time;Helper checks for pocketed food;Supervision or verbal cues   Eating Set Up Assist For: Opening containers        Cognition Comprehension Comprehension assist level: Understands basic 75 - 89% of the time/ requires cueing 10 - 24% of the time  Expression   Expression assist level: Expresses basic 25 - 49% of the time/requires cueing 50 - 75% of the time. Uses single words/gestures.  Social Interaction Social Interaction assist level: Interacts appropriately 75 - 89% of the time - Needs redirection for appropriate language or to initiate interaction.  Problem Solving Problem solving assist level: Solves basic 25 - 49% of the time - needs direction more than half the time to initiate, plan or complete simple activities  Memory Memory assist level: Recognizes or recalls 25 - 49% of the time/requires cueing 50 - 75% of the time    Pain Pain Assessment Pain Assessment: No/denies pain  Therapy/Group: Individual Therapy  Christina Bradshaw, M.A., CCC-SLP [phone]  Christina Bradshaw 03/04/2016, 9:23 AM

## 2016-03-04 NOTE — Progress Notes (Signed)
Occupational Therapy Session Note  Patient Details  Name: Christina Bradshaw MRN: [id number] Date of Birth: 1924-03-01  Today's Date: 03/04/2016 OT Individual Time: 1045-1200 OT Individual Time Calculation (min): 75 min     Short Term Goals:Week 1:  OT Short Term Goal 1 (Week 1): Pt will complete sit > stand with max assist to complete LB dressing/hygiene OT Short Term Goal 2 (Week 1): Pt will complete toilet transfers with max assist 3 out of 4 attempts OT Short Term Goal 3 (Week 1): Pt will don pants with max assist of one caregiver OT Short Term Goal 4 (Week 1): Pt will position RUE during self-care tasks with min cues to increase awareness of positioning and decrease injury to shoulder  Skilled Therapeutic Interventions/Progress Updates:    pt seen for ADL retraining from w/c level with focus on postural control and static standing. Pt completed oral care, bathing mod A, UB dressing min A. When time to don pants pt was incontinent of urine in w/c (her brief that was removed prior to bathing was soiled).  +2 A to help with clean up of pt and chair.  Pt was then able to sit to stand with max A of 1 while therapist pulled pants over hips.  Pt taken to gym to practice squat pivot transfers to mat. Hand over hand guiding facilitation of RUE with table slides to attempt to elicit muscular activity. No movement observed. Pt tolerated PROM of RUE well. Pt taken back to room with all needs met.  Therapy Documentation Precautions:  Precautions Precautions: Fall Precaution Comments: dense Rt hemiplegia Restrictions Weight Bearing Restrictions: No  Vital Signs: Therapy Vitals BP: (!) 154/56 Pain: Pain Assessment Pain Assessment: No/denies pain ADL: See Function Navigator for Current Functional Status.   Therapy/Group: Individual Therapy  [place] 03/04/2016, 10:32 AM

## 2016-03-04 NOTE — Progress Notes (Signed)
[place] PHYSICAL MEDICINE & REHABILITATION     PROGRESS NOTE  Subjective/Complaints:  Pt seen laying in bed this AM.  She slept well overnight.    ROS: Denies CP, SOB, N/V/D.  Objective: Vital Signs: Blood pressure 136/62, pulse 64, temperature 98 F (36.7 C), temperature source Oral, resp. rate 17, height 5\' 7"  (1.702 m), weight 53.9 kg (118 lb 13.3 oz), SpO2 98 %. No results found.  Recent Labs  03/04/16 0451  WBC 11.0*  HGB 14.5  HCT 44.3  PLT 198    Recent Labs  03/04/16 0451  NA 148*  K 3.2*  CL 116*  GLUCOSE 109*  BUN 47*  CREATININE 1.24*  CALCIUM 8.7*   CBG (last 3)  No results for input(s): GLUCAP in the last 72 hours.  Wt Readings from Last 3 Encounters:  03/01/16 53.9 kg (118 lb 13.3 oz)  02/27/16 66.5 kg (146 lb 9.7 oz)  10/25/14 67.6 kg (149 lb)    Physical Exam:  BP 136/62 (BP Location: Left Arm)   Pulse 64   Temp 98 F (36.7 C) (Oral)   Resp 17   Ht 5\' 7"  (1.702 m)   Wt 53.9 kg (118 lb 13.3 oz)   SpO2 98%   BMI 18.61 kg/m  Constitutional: She appears well-developed and well-nourished.  HENT: Normocephalic and atraumatic.  Eyes: EOM are normal. No discharge.  Cardiovascular: RRR. No JVD. Murmur heard.  Respiratory: Unlabored. Clear GI: Soft. Bowel sounds are normal.  Musculoskeletal: She exhibits no edema. No tenderness Neurological: She is alert.  Right facial weakness with moderate to severe dysarthria  Able to follow basic commands Motor: RUE 0/5 prox to distal.  RLE: 2+/5 HF, 2/5 KE, 0/5 ADF/DP. mAS 1/4 knee flexion Sensation diminished to light touch RUE/RLE Skin: Skin is warm and dry. Intact. Psychiatric: She has a normal mood and affect. Her behavior is normal.   Assessment/Plan: 1. Functional deficits secondary to left corona radiata,putamen,caudate infarct which require 3+ hours per day of interdisciplinary therapy in a comprehensive inpatient rehab setting. Physiatrist is providing close team supervision and 24 hour  management of active medical problems listed below. Physiatrist and rehab team continue to assess barriers to discharge/monitor patient progress toward functional and medical goals.  Function:  Bathing Bathing position   Position: Wheelchair/chair at sink  Bathing parts Body parts bathed by patient: Chest, Abdomen Body parts bathed by helper: Right arm, Left arm, Front perineal area, Buttocks, Right lower leg, Left lower leg, Back, Right upper leg, Left upper leg  Bathing assist Assist Level:  (max assist)      Upper Body Dressing/Undressing Upper body dressing   What is the patient wearing?: Pull over shirt/dress     Pull over shirt/dress - Perfomed by patient: Put head through opening, Thread/unthread left sleeve Pull over shirt/dress - Perfomed by helper: Thread/unthread right sleeve, Pull shirt over trunk        Upper body assist Assist Level:  (Mod assist)      Lower Body Dressing/Undressing Lower body dressing   What is the patient wearing?: Pants, Non-skid slipper socks       Pants- Performed by helper: Thread/unthread right pants leg, Thread/unthread left pants leg, Pull pants up/down   Non-skid slipper socks- Performed by helper: Don/doff right sock, Don/doff left sock                  Lower body assist Assist for lower body dressing: 2 Surveyor, minerals  Toileting activity did not occur: No continent bowel/bladder event   Toileting steps completed by helper: Adjust clothing prior to toileting, Performs perineal hygiene, Adjust clothing after toileting Toileting Assistive Devices: Grab bar or rail  Toileting assist Assist level: Two helpers   Transfers Chair/bed transfer Chair/bed transfer activity did not occur: N/A Chair/bed transfer method: Squat pivot Chair/bed transfer assist level: Maximal assist (Pt 25 - 49%/lift and lower) Chair/bed transfer assistive device: Armrests Mechanical lift: Landscape architecttedy   Locomotion Ambulation     Max  distance: 10' Assist level: Maximal assist (Pt 25 - 49%)   Wheelchair   Type: Manual Max wheelchair distance: 16400ft (hemi technique) Assist Level: Supervision or verbal cues  Cognition Comprehension Comprehension assist level: Understands basic 75 - 89% of the time/ requires cueing 10 - 24% of the time  Expression Expression assist level: Expresses basic 50 - 74% of the time/requires cueing 25 - 49% of the time. Needs to repeat parts of sentences.  Social Interaction Social Interaction assist level: Interacts appropriately 75 - 89% of the time - Needs redirection for appropriate language or to initiate interaction.  Problem Solving Problem solving assist level: Solves basic 25 - 49% of the time - needs direction more than half the time to initiate, plan or complete simple activities  Memory Memory assist level: Recognizes or recalls 25 - 49% of the time/requires cueing 50 - 75% of the time    Medical Problem List and Plan: 1. Right hemiparesis and dysarthria/dysphagia secondary to left corona radiata,putamen,caudate infarct on 1/9  Cont CIR 2. DVT Prophylaxis/Anticoagulation: Pharmaceutical: Lovenox  3. Pain Management: tylenol prn  4. Mood:   Has history of anxiety  Started prozac to help with mood and recovery.   LCSW to follow for evaluation and support.  5. Neuropsych: This patient is not fully capable of making decisions on her own behalf.  6. Skin/Wound Care: Routine pressure relief measures. Maintain adequate nutritional and hydration status.  7. Fluids/Electrolytes/Nutrition: Monitor I/O. Supplements between meals.  8. HTN: Monitor BP bid  Norvasc increased to 10mg  on 1/15  Cont lisinopril and metoprolol bid.   Improving 9. Dysphagia: Continue dysphagia 1, honey liquids. Offer fluids between meals to maintain adequate hydration.  10. Dyslipidemia: on lipitor  11. Prediabetes: Hgb A1c- 6.0.   Modified diet to HH/CM. RD to educate patient and family on appropriate diet.    CBGs overall controlled 1/16 12. GERD: Managed On Protonix.  13. Chronic constipation: Uses softners as well as suppository daily.   Started senna S at supper followed by suppository in am.  14. Emerging right spastic hemiparesis:   Ordered baclofen prn for now.   Resting hand splint and R-AFO.   ROM with therapy.   Reviewed stretching,positioning with family  5815. Hypokalemia  K+ 3.2 on 1/16  Supplemented, cont to monitor 16. Hypoalbuminemia  Supplement initiated 1/13 17. AKI  Cr. 1.24 on 1/16  Cont to monitor 18. Leukocytosis  WBCs 11.0 on 1/16  Afebrile  Cont to monitor 19. Hypernatremia  Na+ 148 on 1/16  Cont to monitor  LOS (Days) 4 A FACE TO FACE EVALUATION WAS PERFORMED  Christina Bradshaw Christina Bradshaw Christina Bradshaw 03/04/2016 8:14 AM

## 2016-03-04 NOTE — Progress Notes (Signed)
Recreational Therapy Assessment and Plan  Patient Details  Name: DENNA FRYBERGER MRN: [id number] Date of Birth: September 05, 1924 Today's Date: 03/04/2016  Rehab Potential:  Good  ELOS:   3 weeks  Assessment Clinical Impression:  Problem List: Patient Active Problem List   Diagnosis Date Noted  . Prediabetes   . Hypokalemia   . Hypoalbuminemia due to protein-calorie malnutrition ([place])   . Stage 3 chronic kidney disease   . Left sided cerebral hemisphere cerebrovascular accident (CVA) ([place]) 02/29/2016  . Spastic hemiparesis of right dominant side due to cerebral infarction ([place]) 02/29/2016  . Dysphagia 02/29/2016  . Essential hypertension 02/29/2016  . TIA (transient ischemic attack) 02/27/2016  . CVA (cerebral vascular accident) ([place]) 02/27/2016    Past Medical History:      Past Medical History:  Diagnosis Date  . Hypertension   . Stroke [facility])    Past Surgical History:       Past Surgical History:  Procedure Laterality Date  . ABDOMINAL HYSTERECTOMY      Assessment & Plan Clinical Impression: ZITLALY MALSON is an 83 y.o. female with history of HTN, palpitations, who was admitted on 02/26/15 with compliants of dizziness and speech changes since awakening from sleep. Her symptoms did not improve and was taken to [facility] ED by family. NIHSS 7 and MRI brain done revealing acute infarct left corona radiata extending into putamen and left caudate. MRA negative for stenosis/large vessel occlusion. Carotid dopplers with non-occlusive calcific plaque formation in left carotid bulb and no significant ICA stenosis. 2D echo with EF 60-65% with mild LVH and calcified mitral annulus. She did worsening of weakness overnight with dense right hemiparesis. Dr. Doy Mince recommends ASA and statin for stroke due to small vessel disease. Cardiac enzymes being monitored and elevation felt to be due to demand ischemia and doubt MI per reports. BSS done revealing signs of aspiration with thin  and placed on dysphagia 1, nectar liquids. Therapy evaluations done revealing decreased balance with posterior lean and RLE instability.   Patient transferred to CIR on 02/29/2016.   Pt presents with decreased activity tolerance, decreased functional mobility, decreased balance, decreased coordination, right inattention, decreased awareness, decreased problem solving and decreased memory Limiting pt's independence with leisure/community pursuits.  Plan Min 1 TR session/group for >20 minutes during LOS Recommendations for other services: None   Discharge Criteria: Patient will be discharged from TR if patient refuses treatment 3 consecutive times without medical reason.  If treatment goals not met, if there is a change in medical status, if patient makes no progress towards goals or if patient is discharged from hospital.  The above assessment, treatment plan, treatment alternatives and goals were discussed and mutually agreed upon: by patient  Bonny Doon 03/04/2016, 2:00 PM

## 2016-03-04 NOTE — Progress Notes (Addendum)
Physical Therapy Session Note  Patient Details  Name: Christina Bradshaw MRN: [id number] Date of Birth: [birth year]  Today's Date: 03/04/2016 PT Individual Time:  -       Short Term Goals: Week 1:  PT Short Term Goal 1 (Week 1): Pt will performe supine<>sit with Mod A  PT Short Term Goal 2 (Week 1): Pt will perform squat-pivot transfer with Mod A PT Short Term Goal 3 (Week 1): Pt will perform gait x25' with max A and LRAD PT Short Term Goal 4 (Week 1): Pt will perform WC propulsion x50' with S  Skilled Therapeutic Interventions/Progress Updates:    Pt presented in w/'c agreeable to therapy. Propelled 35 in w/c hemi technique with mod cues for use of LUE. Attempted gait training using wall rail. Max cues for wt shift, total assist for advancement of RLE. Multimodal cues for posture. Distance 29f. Trial sit to stand at // bars with manual blocking R knee.  Pt able to improve wt shift to R with manual facilitation. Manual cues to decrease RLE valgus with limited carryover. Performed squat pivot transfer x 2 w/c to mat with cues for hand placement, max A. Sitting balance activities unsupported reaching outside BOS x 7 minutes. Leaning on mat to R for core activation and wt bearing through RUE.Pt returned to w/c via squat pivot and propelled return to room with minA and no breaks. Squat pivot transfer w/c to bed with maxA. Sit to supine maxA for truncal support and LE placement. Max A [facility]ooting to [facility] for pt to use hand rail and push with LLE to facilitate transfer. Pt left in bed with alarm on, call bell within reach and all needs met.  Therapy Documentation Precautions:  Precautions Precautions: Fall Precaution Comments: dense Rt hemiplegia Restrictions Weight Bearing Restrictions: No General:   Vital Signs: Therapy Vitals Temp: 98 F (36.7 C) Temp Source: Oral Pulse Rate: 64 Resp: 17 BP: 136/62 Patient Position (if appropriate): Lying Oxygen Therapy SpO2: 98 % O2 Device:  Not Delivered   See Function Navigator for Current Functional Status.   Therapy/Group: Individual Therapy  Cheyne Bungert  Jazae Gandolfi, PTA  03/04/2016, 8:26 AM

## 2016-03-05 ENCOUNTER — Inpatient Hospital Stay (HOSPITAL_COMMUNITY): Payer: Medicare Other | Admitting: Occupational Therapy

## 2016-03-05 ENCOUNTER — Inpatient Hospital Stay (HOSPITAL_COMMUNITY): Payer: Medicare Other | Admitting: Speech Pathology

## 2016-03-05 ENCOUNTER — Inpatient Hospital Stay (HOSPITAL_COMMUNITY): Payer: Medicare Other | Admitting: Physical Therapy

## 2016-03-05 MED ORDER — ADULT MULTIVITAMIN LIQUID CH
15.0000 mL | Freq: Every day | ORAL | Status: DC
  Administered 2016-03-05 – 2016-03-12 (×8): 15 mL via ORAL
  Filled 2016-03-05 (×11): qty 15

## 2016-03-05 MED ORDER — HYDRALAZINE HCL 10 MG PO TABS
10.0000 mg | ORAL_TABLET | Freq: Three times a day (TID) | ORAL | Status: DC
  Administered 2016-03-05 – 2016-03-06 (×4): 10 mg via ORAL
  Filled 2016-03-05 (×4): qty 1

## 2016-03-05 MED ORDER — DOCUSATE SODIUM 50 MG/5ML PO LIQD
100.0000 mg | Freq: Every day | ORAL | Status: DC
  Administered 2016-03-05 – 2016-03-22 (×11): 100 mg via ORAL
  Filled 2016-03-05 (×14): qty 10

## 2016-03-05 NOTE — Progress Notes (Signed)
Speech Language Pathology Daily Session Note  Patient Details  Name: Christina LungMildred C Cajamarca MRN: [id number] Date of Birth: [birth year]  Today's Date: 03/05/2016 SLP Individual Time: [id number] SLP Individual Time Calculation (min): 18 min   Short Term Goals: Week 1: SLP Short Term Goal 1 (Week 1): Patient will consume current diet with minimal overt s/s of aspiration with Mod A verbal cues for use of swallowing compensatory strategies.  SLP Short Term Goal 2 (Week 1): Patient will consume trials of nectar-thick liquids with minimal overt s/s of aspiration over 2 sessions prior to upgrade or repeat objective assessment.  SLP Short Term Goal 3 (Week 1): Patient will utilize speech intelligibility strategies at the phrase level with Mod A multimodal cues.  SLP Short Term Goal 4 (Week 1): Patient will utilize word-finding strateiges during functional tasks/conversations with Min A multimodal cues.  SLP Short Term Goal 5 (Week 1): Patient will demonstrate functional problem solving for basic and familiar tasks with Mod A multimodal cues.  SLP Short Term Goal 6 (Week 1): Patient will recall new, daily information with Mod A multimodal cues.   Skilled Therapeutic Interventions:  Pt was seen for skilled ST targeting dysphagia goals.  SLP facilitated the session with trials of nectar thick liquids to continue working towards diet progression.  Pt demonstrated immediate cough on 20% of trials when consumed via teaspoon, 100% of trials via cup sips which appeared to be related to delay in swallow initiation as swallow was consistently audible with cup sips.  Pt was left in bed with bed alarm set and call bell left within reach.  Continue per current plan of care.    Function:  Eating Eating   Modified Consistency Diet: Yes Eating Assist Level: Supervision or verbal cues           Cognition Comprehension Comprehension assist level: Understands basic 90% of the time/cues < 10% of the time  Expression    Expression assist level: Expresses basic 50 - 74% of the time/requires cueing 25 - 49% of the time. Needs to repeat parts of sentences.  Social Interaction Social Interaction assist level: Interacts appropriately 75 - 89% of the time - Needs redirection for appropriate language or to initiate interaction.  Problem Solving Problem solving assist level: Solves basic 25 - 49% of the time - needs direction more than half the time to initiate, plan or complete simple activities  Memory Memory assist level: Recognizes or recalls 50 - 74% of the time/requires cueing 25 - 49% of the time    Pain Pain Assessment Pain Assessment: No/denies pain  Therapy/Group: Individual Therapy  Demarko Zeimet, Melanee SpryNicole L 03/05/2016, 6:15 PM

## 2016-03-05 NOTE — Progress Notes (Signed)
Physical Therapy Session Note  Patient Details  Name: Christina Bradshaw MRN: [id number] Date of Birth: 11-14-1924  Today's Date: 03/05/2016 PT Individual Time: 0900-1000 PT Individual Time Calculation (min): 60 min    Short Term Goals: Week 1:  PT Short Term Goal 1 (Week 1): Pt will performe supine<>sit with Mod A  PT Short Term Goal 2 (Week 1): Pt will perform squat-pivot transfer with Mod A PT Short Term Goal 3 (Week 1): Pt will perform gait x25' with max A and LRAD PT Short Term Goal 4 (Week 1): Pt will perform WC propulsion x50' with S  Skilled Therapeutic Interventions/Progress Updates:   Pt presented in bed agreeable to therapy. Performed log rolling minA to L, maxA to R for changing briefs and donning pants. Performed supine to sit with HOB elevated and cues for hand placement and sequencing with mod/maxA. Requiring cues for LE placement and trunk control. Pt able to maintain unsupported sitting F balance and requiring modA for donning shirt. Performed squat pivot transfer to w/c with mod/maxA.  Pt propelled to rehab gym.  Continued squat pivot transfer training. Using scooting method to edge of mat. Pt able to progress to modA with x3 trials. Gait training after therapeutic break, total x 10 ft with maxA. Required total assist for RLE advancement. Pt transported back to room and left in chair with call bell within reach and all current needs met.   Therapy Documentation Precautions:  Precautions Precautions: Fall Precaution Comments: dense Rt hemiplegia Restrictions Weight Bearing Restrictions: No   Therapy/Group: Individual Therapy  Raif Chachere  Julianne Chamberlin, PTA  03/05/2016, 12:36 PM

## 2016-03-05 NOTE — Plan of Care (Signed)
Problem: RH PAIN MANAGEMENT Goal: RH STG PAIN MANAGED AT OR BELOW PT'S PAIN GOAL <3 on a 0-10 pain scale  Outcome: Not Applicable Date Met: 50/75/73 No Pain.

## 2016-03-05 NOTE — Progress Notes (Signed)
Occupational Therapy Session Note  Patient Details  Name: Christina Bradshaw MRN: [id number] Date of Birth: 31-Oct-1924  Today's Date: 03/05/2016 OT Individual Time: 0700-0800 and [id number] OT Individual Time Calculation (min): 60 min and 54 min     Short Term Goals: Week 1:  OT Short Term Goal 1 (Week 1): Pt will complete sit > stand with max assist to complete LB dressing/hygiene OT Short Term Goal 2 (Week 1): Pt will complete toilet transfers with max assist 3 out of 4 attempts OT Short Term Goal 3 (Week 1): Pt will don pants with max assist of one caregiver OT Short Term Goal 4 (Week 1): Pt will position RUE during self-care tasks with min cues to increase awareness of positioning and decrease injury to shoulder  Skilled Therapeutic Interventions/Progress Updates:     Session 1:Upon entering the room, pt supine in bed with no c/o pain but reports fatigue. Pt agreeable to sitting on EOB with mod A for trunk and R LE. Pt seated with close supervision - min guard for breakfast. Pt needing min cues for safety with swallowing strategies and assistance to open containers. Pt reports, " I felt like maybe I just peed." Pt returns to supine and pt able to wash peri area with set up A and OT assisting pt with donning brief. Pt fatigued and requested to remain in bed at this time. Call bell and all needed items within reach upon exiting the room.   Session 2: Upon entering the room, pt seated in wheelchair with no c/o pain and RN present to give medications. OT noticed pt with wet clothing as well as wheelchair as pt has urinated on self and is unaware. OT propelled pt into bathroom with total A stand pivot transfer onto standard toilet. Pt required supervision for safety. LB clothing donned while seated on toilet with total A. Pt needing total A for hygiene and clothing management. Transferred back into wheelchair in same manner and returned to bed . Sit >supine with mod A for B LEs. Call bell and all  needed items within reach upon exiting the room.   Therapy Documentation Precautions:  Precautions Precautions: Fall Precaution Comments: dense Rt hemiplegia Restrictions Weight Bearing Restrictions: No  See Function Navigator for Current Functional Status.   Therapy/Group: Individual Therapy  [name], Cliffton Spradley P 03/05/2016, 12:30 PM

## 2016-03-05 NOTE — Progress Notes (Signed)
PHYSICAL MEDICINE & REHABILITATION     PROGRESS NOTE  Subjective/Complaints:  Pt seen sitting up at the edge of the bed this AM working with OT.  She slept well overnight.    ROS: Denies CP, SOB, N/V/D.  Objective: Vital Signs: Blood pressure (!) 157/66, pulse 71, temperature 98.1 F (36.7 C), temperature source Oral, resp. rate 18, height 5\' 7"  (1.702 m), weight 53.9 kg (118 lb 13.3 oz), SpO2 98 %. No results found.  Recent Labs  03/04/16 0451  WBC 11.0*  HGB 14.5  HCT 44.3  PLT 198    Recent Labs  03/04/16 0451  NA 148*  K 3.2*  CL 116*  GLUCOSE 109*  BUN 47*  CREATININE 1.24*  CALCIUM 8.7*   CBG (last 3)  No results for input(s): GLUCAP in the last 72 hours.  Wt Readings from Last 3 Encounters:  03/01/16 53.9 kg (118 lb 13.3 oz)  02/27/16 66.5 kg (146 lb 9.7 oz)  10/25/14 67.6 kg (149 lb)    Physical Exam:  BP (!) 157/66 (BP Location: Left Arm)   Pulse 71   Temp 98.1 F (36.7 C) (Oral)   Resp 18   Ht 5\' 7"  (1.702 m)   Wt 53.9 kg (118 lb 13.3 oz)   SpO2 98%   BMI 18.61 kg/m  Constitutional: She appears well-developed and well-nourished.  HENT: Normocephalic and atraumatic.  Eyes: EOM are normal. No discharge.  Cardiovascular: RRR. No JVD. Murmur heard.  Respiratory: Unlabored. Clear GI: Soft. Bowel sounds are normal.  Musculoskeletal: She exhibits no edema. No tenderness Neurological: She is alert.  Right facial weakness with moderate to severe dysarthria  Able to follow basic commands Motor: RUE 0/5 prox to distal (unchanged).  RLE: 2+/5 HF, 2+/5 KE, 0/5 ADF/DP. mAS 1/4 knee flexion, ADF Sensation diminished to light touch RUE/RLE Skin: Skin is warm and dry. Intact. Psychiatric: She has a normal mood and affect. Her behavior is normal.   Assessment/Plan: 1. Functional deficits secondary to left corona radiata,putamen,caudate infarct which require 3+ hours per day of interdisciplinary therapy in a comprehensive inpatient rehab  setting. Physiatrist is providing close team supervision and 24 hour management of active medical problems listed below. Physiatrist and rehab team continue to assess barriers to discharge/monitor patient progress toward functional and medical goals.  Function:  Bathing Bathing position   Position: Wheelchair/chair at sink  Bathing parts Body parts bathed by patient: Chest, Abdomen, Right arm, Front perineal area, Right upper leg, Left upper leg, Left lower leg Body parts bathed by helper: Right lower leg, Back, Buttocks, Left arm  Bathing assist Assist Level:  (max assist)      Upper Body Dressing/Undressing Upper body dressing   What is the patient wearing?: Pull over shirt/dress     Pull over shirt/dress - Perfomed by patient: Put head through opening, Thread/unthread right sleeve, Pull shirt over trunk Pull over shirt/dress - Perfomed by helper: Thread/unthread left sleeve        Upper body assist Assist Level:  (Mod assist)      Lower Body Dressing/Undressing Lower body dressing   What is the patient wearing?: Pants, Non-skid slipper socks, Shoes       Pants- Performed by helper: Thread/unthread right pants leg, Thread/unthread left pants leg, Pull pants up/down   Non-skid slipper socks- Performed by helper: Don/doff right sock, Don/doff left sock       Shoes - Performed by helper: Don/doff right shoe, Don/doff left shoe, Fasten right, Fasten left  Lower body assist Assist for lower body dressing: 2 Helpers      Toileting Toileting Toileting activity did not occur: No continent bowel/bladder event Toileting steps completed by patient: Adjust clothing after toileting, Adjust clothing prior to toileting Toileting steps completed by helper: Adjust clothing prior to toileting, Performs perineal hygiene, Adjust clothing after toileting Toileting Assistive Devices: Grab bar or rail  Toileting assist Assist level: Touching or steadying assistance (Pt.75%)    Transfers Chair/bed transfer Chair/bed transfer activity did not occur: N/A Chair/bed transfer method: Squat pivot Chair/bed transfer assist level: Maximal assist (Pt 25 - 49%/lift and lower) Chair/bed transfer assistive device: Armrests Mechanical lift: Stedy   Locomotion Ambulation     Max distance: 5 ft Assist level: Maximal assist (Pt 25 - 49%)   Wheelchair   Type: Manual Max wheelchair distance: 148ft (hemi technique) Assist Level: Supervision or verbal cues  Cognition Comprehension Comprehension assist level: Understands basic 90% of the time/cues < 10% of the time  Expression Expression assist level: Expresses basic 25 - 49% of the time/requires cueing 50 - 75% of the time. Uses single words/gestures.  Social Interaction Social Interaction assist level: Interacts appropriately 75 - 89% of the time - Needs redirection for appropriate language or to initiate interaction.  Problem Solving Problem solving assist level: Solves basic 25 - 49% of the time - needs direction more than half the time to initiate, plan or complete simple activities  Memory Memory assist level: Recognizes or recalls 50 - 74% of the time/requires cueing 25 - 49% of the time    Medical Problem List and Plan: 1. Right hemiparesis and dysarthria/dysphagia secondary to left corona radiata,putamen,caudate infarct on 1/9  Cont CIR 2. DVT Prophylaxis/Anticoagulation: Pharmaceutical: Lovenox  3. Pain Management: tylenol prn  4. Mood:   Has history of anxiety  Started prozac to help with mood and recovery.   LCSW to follow for evaluation and support.  5. Neuropsych: This patient is not fully capable of making decisions on her own behalf.  6. Skin/Wound Care: Routine pressure relief measures. Maintain adequate nutritional and hydration status.  7. Fluids/Electrolytes/Nutrition: Monitor I/O. Supplements between meals.  8. HTN: Monitor BP bid  Norvasc increased to 10mg  on 1/15  Hydralazine 10 started  1/17  Cont lisinopril and metoprolol bid.  9. Dysphagia: Continue dysphagia 1, honey liquids. Offer fluids between meals to maintain adequate hydration.  10. Dyslipidemia: on lipitor  11. Prediabetes: Hgb A1c- 6.0.   Modified diet to HH/CM. RD to educate patient and family on appropriate diet.   CBGs overall controlled 1/17 12. GERD: Managed On Protonix.  13. Chronic constipation: Uses softners as well as suppository daily.   Started senna S at supper followed by suppository in am.  14. Emerging right spastic hemiparesis:   Ordered baclofen prn for now.   Resting hand splint and R-AFO.   ROM with therapy.   Reviewed stretching,positioning with family  74. Hypokalemia  K+ 3.2 on 1/16  Supplement increased on 1/16  Cont to monitor 16. Hypoalbuminemia  Supplement initiated 1/13 17. AKI  Cr. 1.24 on 1/16  Cont to monitor  IVF qHS started 1/16 18. Leukocytosis  WBCs 11.0 on 1/16  Afebrile  Cont to monitor 19. Hypernatremia  Na+ 148 on 1/16  Cont to monitor  LOS (Days) 5 A FACE TO FACE EVALUATION WAS PERFORMED  Marshal Eskew Karis Juba 03/05/2016 7:35 AM

## 2016-03-06 ENCOUNTER — Inpatient Hospital Stay (HOSPITAL_COMMUNITY): Payer: Medicare Other | Admitting: Physical Therapy

## 2016-03-06 ENCOUNTER — Inpatient Hospital Stay (HOSPITAL_COMMUNITY): Payer: Medicare Other | Admitting: Occupational Therapy

## 2016-03-06 ENCOUNTER — Inpatient Hospital Stay (HOSPITAL_COMMUNITY): Payer: Medicare Other | Admitting: Speech Pathology

## 2016-03-06 ENCOUNTER — Inpatient Hospital Stay (HOSPITAL_COMMUNITY): Payer: Medicare Other

## 2016-03-06 MED ORDER — HYDRALAZINE HCL 25 MG PO TABS
25.0000 mg | ORAL_TABLET | Freq: Three times a day (TID) | ORAL | Status: DC
  Administered 2016-03-06 – 2016-03-26 (×44): 25 mg via ORAL
  Filled 2016-03-06 (×56): qty 1

## 2016-03-06 NOTE — Progress Notes (Signed)
Speech Language Pathology Daily Session Note  Patient Details  Name: Christina Bradshaw MRN: [id number] Date of Birth: Aug 26, 1924  Today's Date: 03/06/2016 SLP Individual Time: 1300-1400 SLP Individual Time Calculation (min): 60 min  Short Term Goals: Week 1: SLP Short Term Goal 1 (Week 1): Patient will consume current diet with minimal overt s/s of aspiration with Mod A verbal cues for use of swallowing compensatory strategies.  SLP Short Term Goal 2 (Week 1): Patient will consume trials of nectar-thick liquids with minimal overt s/s of aspiration over 2 sessions prior to upgrade or repeat objective assessment.  SLP Short Term Goal 3 (Week 1): Patient will utilize speech intelligibility strategies at the phrase level with Mod A multimodal cues.  SLP Short Term Goal 4 (Week 1): Patient will utilize word-finding strateiges during functional tasks/conversations with Min A multimodal cues.  SLP Short Term Goal 5 (Week 1): Patient will demonstrate functional problem solving for basic and familiar tasks with Mod A multimodal cues.  SLP Short Term Goal 6 (Week 1): Patient will recall new, daily information with Mod A multimodal cues.   Skilled Therapeutic Interventions: Skilled treatment session focused on functional communication and dysphagia goals. Patient had already completed lunch upon my arrival but was agreeable to consume honey-thick liquids. Patient consumed liquids via cup without overt s/s of aspiration but required Mod A verbal cues for use of swallowing compensatory strategies. Patient also participated in a verbal description task with extra time and Mod A verbal cues needed for use of an increased oval intensity to achieve ~90% intelligibility and Min A verbal cues for word-finding. Patient able to express her wants/needs with extra time and Min A verbal cues in regards to needing her brief changed. Patient left supine in bed with son present. Continue with current plan of care.     Function:  Eating Eating   Modified Consistency Diet: Yes Eating Assist Level: Supervision or verbal cues;Set up assist for   Eating Set Up Assist For: Opening containers       Cognition Comprehension Comprehension assist level: Understands basic 90% of the time/cues < 10% of the time  Expression   Expression assist level: Expresses basic 50 - 74% of the time/requires cueing 25 - 49% of the time. Needs to repeat parts of sentences.  Social Interaction Social Interaction assist level: Interacts appropriately 75 - 89% of the time - Needs redirection for appropriate language or to initiate interaction.  Problem Solving Problem solving assist level: Solves basic 50 - 74% of the time/requires cueing 25 - 49% of the time  Memory Memory assist level: Recognizes or recalls 50 - 74% of the time/requires cueing 25 - 49% of the time    Pain No/Denies Pain   Therapy/Group: Individual Therapy  Christina Bradshaw 03/06/2016, 3:37 PM

## 2016-03-06 NOTE — Patient Care Conference (Signed)
Inpatient RehabilitationTeam Conference and Plan of Care Update Date: 03/05/2016   Time: 2:45 PM    Patient Name: Christina Bradshaw      Medical Record Number: [id number]  Date of Birth: [birth year] Sex: Female         Room/Bed: 4M01C/4M01C-01 Payor Info: Payor: MEDICARE / Plan: MEDICARE PART A AND B / Product Type: *No Product type* /    Admitting Diagnosis: l cva  Admit Date/Time:  02/29/2016  3:34 PM Admission Comments: No comment available   Primary Diagnosis:  Left sided cerebral hemisphere cerebrovascular accident (CVA) (HCC) Principal Problem: Left sided cerebral hemisphere cerebrovascular accident (CVA) [facility](HCC)  Patient Active Problem List   Diagnosis Date Noted  . Hypernatremia   . Leukocytosis   . AKI (acute kidney injury) (HCC)   . Labile blood pressure   . Prediabetes   . Hypokalemia   . Hypoalbuminemia due to protein-calorie malnutrition (HCC)   . Stage 3 chronic kidney disease   . Left sided cerebral hemisphere cerebrovascular accident (CVA) (HCC) 02/29/2016  . Spastic hemiparesis of right dominant side due to cerebral infarction (HCC) 02/29/2016  . Dysphagia 02/29/2016  . Essential hypertension 02/29/2016  . TIA (transient ischemic attack) 02/27/2016  . CVA (cerebral vascular accident) (HCC) 02/27/2016    Expected Discharge Date: Expected Discharge Date: 03/26/16  Team Members Present: Physician leading conference: Dr. Maryla MorrowAnkit Patel Social Worker Present: Amada JupiterLucy Avrey Hyser, LCSW Nurse Present: Kennon PortelaJeanna Hicks, RN PT Present: Katherine Mantleodney Wishart, PT;Other (comment) (Rosita Dechalus, PTA) OT Present: Callie FieldingKatie Pittman, OT SLP Present: Fae PippinMelissa Bowie, SLP PPS Coordinator present : Edson SnowballBecky Windsor, PT     Current Status/Progress Goal Weekly Team Focus  Medical   Right hemiparesis and dysarthria/dysphagia secondary to left corona radiata,putamen,caudate infarct on 1/9  Improve mobility, safety, transfers, comorbid conditions  See above   Bowel/Bladder   Incontnent of bowel and  bladder. LBM 03/06/16  Managed bowel and bladder  Timed toileting q 2hrs   Swallow/Nutrition/ Hydration   Dys 1, honey thick liquids   supervision   toleration of current diet and trials of advanced textures   ADL's   mod A bathing, min A donning shirt, total A pants, incontinent, max A sit to stand and squat pivot transfers, RUE flaccid  min standing, min tub bench transfers, bathing, dressing, toileting  ADL retraining, functional mobility, balance, RUE management and NMR, R visual scanning   Mobility   maxA transfers, maxA gait 5-3610ft with dependent placement of RLE  MinA transfers, and gait 7150ft. Stair management x 5 with 1 rail  Bed mobilty, squat pivot transfers, pre-gait activities, Balance    Communication   mod assist   min assist-supervision    education and carryover of compensatory strategies   Safety/Cognition/ Behavioral Observations  mod-max assist   min assist   basic cognition    Pain   No c/o pain   <3  Monitor for nonverbal cues of pain   Skin   CDI  CDI  Assess q shift, and encourage to turn q2hrs    Rehab Goals Patient on target to meet rehab goals: Yes *See Care Plan and progress notes for long and short-term goals.  Barriers to Discharge: Mobiliy, safety, transfers, HTN, prediabetes, hypokalemia, hypernatremia, AKI, leukocytosis    Possible Resolutions to Barriers:  Follow labs, IVF qHS, K+ supplementation, therapies    Discharge Planning/Teaching Needs:  Pt to dc home with family to arrange 24/7 assistance -   TBD   Team Discussion:  BP meds being adjusted; IVF  at night and watching labs closely. incont b/b and recommeding timed toiletin.  Pt with NO AWRENESS of b/b issues.  theapies all feel that min assist goals overall may be lofty.  Currently mod - total assistance.  Diet has been downgraded as well.  SW to follow up with family but may need SNF.  Revisions to Treatment Plan:  None but tx goals may be downgraded   Continued Need for Acute  Rehabilitation Level of Care: The patient requires daily medical management by a physician with specialized training in physical medicine and rehabilitation for the following conditions: Daily direction of a multidisciplinary physical rehabilitation program to ensure safe treatment while eliciting the highest outcome that is of practical value to the patient.: Yes Daily medical management of patient stability for increased activity during participation in an intensive rehabilitation regime.: Yes Daily analysis of laboratory values and/or radiology reports with any subsequent need for medication adjustment of medical intervention for : Neurological problems;Diabetes problems;Blood pressure problems;Renal problems  Christina Bradshaw 03/07/2016, 9:21 AM

## 2016-03-06 NOTE — Progress Notes (Signed)
Occupational Therapy Session Note  Patient Details  Name: Christina Bradshaw MRN: [id number] Date of Birth: 1924/04/13  Today's Date: 03/06/2016 OT Individual Time: [id number] OT Individual Time Calculation (min): 45 min    Short Term Goals: Week 1:  OT Short Term Goal 1 (Week 1): Pt will complete sit > stand with max assist to complete LB dressing/hygiene OT Short Term Goal 2 (Week 1): Pt will complete toilet transfers with max assist 3 out of 4 attempts OT Short Term Goal 3 (Week 1): Pt will don pants with max assist of one caregiver OT Short Term Goal 4 (Week 1): Pt will position RUE during self-care tasks with min cues to increase awareness of positioning and decrease injury to shoulder  Skilled Therapeutic Interventions/Progress Updates:  Upon entering the room, pt seated in wheelchair awaiting therapist. Pt requesting grooming at sink with set up A for tasks. Pt declined toileting this session. Pt propelled wheelchair 50' with hemiplegic technique and supervision with min cues for safety. OT propelled pt the rest of the way for time management. Pt engaged in R UE self ROM for self awareness with pt performing 10 reps for digits, wrists, and elbow in all planes. No trace muscle movement noted. Pt attempting shoulder shrugs but no muscle activation noted.  Pt returning back to room at end of session and remaining in wheelchair with call bell and all needed items within reach.      Therapy Documentation Precautions:  Precautions Precautions: Fall Precaution Comments: dense Rt hemiplegia Restrictions Weight Bearing Restrictions: No General:   Vital Signs: Therapy Vitals Pulse Rate: 69 BP: (!) 148/61 Pain:   ADL:   Exercises:   Other Treatments:    See Function Navigator for Current Functional Status.   Therapy/Group: Individual Therapy  [name], Klani Caridi P 03/06/2016, 2:49 PM

## 2016-03-06 NOTE — Progress Notes (Signed)
Physical Therapy Session Note  Patient Details  Name: Christina Bradshaw MRN: [id number] Date of Birth: 1924-10-04  Today's Date: 03/06/2016 PT Individual Time: 1000-1100 PT Individual Time Calculation (min): 60 min   Short Term Goals: Week 1:  PT Short Term Goal 1 (Week 1): Pt will performe supine<>sit with Mod A  PT Short Term Goal 2 (Week 1): Pt will perform squat-pivot transfer with Mod A PT Short Term Goal 3 (Week 1): Pt will perform gait x25' with max A and LRAD PT Short Term Goal 4 (Week 1): Pt will perform WC propulsion x50' with S  Skilled Therapeutic Interventions/Progress Updates:   Session focused on w/c propulsion using hemi-technique with supervision with cues for technique and efficiency of propulsion (rest breaks needed due to fatigue), neuro re-ed for transfer training and sit <> stand and standing balance/postural control, and bed mobility. Pt required max assist for squat pivot transfers (increased difficulty to the L) with cues for technique, hand placement and head/hips relationship. Pt noted to have wet clothing from incontinent urine episode, so returned back to w/c and to room for hygiene and clothing change. Using the [employer] Normantedy, focused on sit <> stands, standing balance, and postural control in standing with max to total assist from low surface but min/mod from perched seat on Stedy to complete hygiene, clothing management, and donning new brief. Using mirror for visual feedback, focused on weightshifting to R to reorient to midline. End of session returned to back to bed with mod assist to return to supine and position in bed.   Therapy Documentation Precautions:  Precautions Precautions: Fall Precaution Comments: dense Rt hemiplegia Restrictions Weight Bearing Restrictions: No General:    Pain:  Reports some soreness and nerve pain in RUE - educated on positioning.   See Function Navigator for Current Functional Status.   Therapy/Group: Individual  Therapy  Karolee StampsGray, Averyanna Sax [name]  Teyana Pierron B. Amilcar Reever, PT, DPT  03/06/2016, 12:02 PM

## 2016-03-06 NOTE — Progress Notes (Signed)
[place] PHYSICAL MEDICINE & REHABILITATION     PROGRESS NOTE  Subjective/Complaints:  Pt seen working with OT this AM.  She slept well overnight.  She notes poor improvement in RUE.  ROS: Denies CP, SOB, N/V/D.  Objective: Vital Signs: Blood pressure (!) 173/70, pulse 69, temperature 99 F (37.2 C), temperature source Oral, resp. rate 18, height 5\' 7"  (1.702 m), weight 54.2 kg (119 lb 7.8 oz), SpO2 99 %. No results found.  Recent Labs  03/04/16 0451  WBC 11.0*  HGB 14.5  HCT 44.3  PLT 198    Recent Labs  03/04/16 0451  NA 148*  K 3.2*  CL 116*  GLUCOSE 109*  BUN 47*  CREATININE 1.24*  CALCIUM 8.7*   CBG (last 3)  No results for input(s): GLUCAP in the last 72 hours.  Wt Readings from Last 3 Encounters:  03/05/16 54.2 kg (119 lb 7.8 oz)  02/27/16 66.5 kg (146 lb 9.7 oz)  10/25/14 67.6 kg (149 lb)    Physical Exam:  BP (!) 173/70   Pulse 69   Temp 99 F (37.2 C) (Oral)   Resp 18   Ht 5\' 7"  (1.702 m)   Wt 54.2 kg (119 lb 7.8 oz)   SpO2 99%   BMI 18.71 kg/m  Constitutional: She appears well-developed and well-nourished.  HENT: Normocephalic and atraumatic.  Eyes: EOMI. No discharge.  Cardiovascular: RRR. No JVD. Murmur heard.  Respiratory: Unlabored. Clear GI: Soft. Bowel sounds are normal.  Musculoskeletal: She exhibits no edema. No tenderness Neurological: She is alert.  Right facial weakness with moderate to severe dysarthria  Able to follow basic commands Motor: RUE 0/5 prox to distal (stable).  RLE: 2+/5 HF, 2+/5 KE, 0/5 ADF/DP. mAS 1/4 knee flexion, ADF Sensation diminished to light touch RUE/RLE Skin: Skin is warm and dry. Intact. Psychiatric: She has a normal mood and affect. Her behavior is normal.   Assessment/Plan: 1. Functional deficits secondary to left corona radiata,putamen,caudate infarct which require 3+ hours per day of interdisciplinary therapy in a comprehensive inpatient rehab setting. Physiatrist is providing close team  supervision and 24 hour management of active medical problems listed below. Physiatrist and rehab team continue to assess barriers to discharge/monitor patient progress toward functional and medical goals.  Function:  Bathing Bathing position   Position: Wheelchair/chair at sink  Bathing parts Body parts bathed by patient: Chest, Abdomen, Right arm, Front perineal area, Right upper leg, Left upper leg, Left lower leg Body parts bathed by helper: Right lower leg, Back, Buttocks, Left arm  Bathing assist Assist Level:  (max assist)      Upper Body Dressing/Undressing Upper body dressing   What is the patient wearing?: Pull over shirt/dress     Pull over shirt/dress - Perfomed by patient: Put head through opening, Thread/unthread right sleeve, Pull shirt over trunk Pull over shirt/dress - Perfomed by helper: Thread/unthread left sleeve        Upper body assist Assist Level:  (Mod assist)      Lower Body Dressing/Undressing Lower body dressing   What is the patient wearing?: Pants, Non-skid slipper socks, Shoes       Pants- Performed by helper: Thread/unthread right pants leg, Thread/unthread left pants leg, Pull pants up/down   Non-skid slipper socks- Performed by helper: Don/doff right sock, Don/doff left sock       Shoes - Performed by helper: Don/doff right shoe, Don/doff left shoe, Fasten right, Fasten left  Lower body assist Assist for lower body dressing:  (maxA)      Toileting Toileting Toileting activity did not occur: No continent bowel/bladder event Toileting steps completed by patient: Adjust clothing after toileting, Adjust clothing prior to toileting Toileting steps completed by helper: Adjust clothing prior to toileting, Performs perineal hygiene, Adjust clothing after toileting Toileting Assistive Devices: Grab bar or rail  Toileting assist Assist level:  (total A)   Transfers Chair/bed transfer Chair/bed transfer activity did not occur:  N/A Chair/bed transfer method: Squat pivot Chair/bed transfer assist level: Maximal assist (Pt 25 - 49%/lift and lower) Chair/bed transfer assistive device: Bedrails Mechanical lift: Landscape architecttedy   Locomotion Ambulation     Max distance: 210ft Assist level: Maximal assist (Pt 25 - 49%)   Wheelchair   Type: Manual Max wheelchair distance: 75 Assist Level: Supervision or verbal cues  Cognition Comprehension Comprehension assist level: Understands basic 90% of the time/cues < 10% of the time  Expression Expression assist level: Expresses basic 50 - 74% of the time/requires cueing 25 - 49% of the time. Needs to repeat parts of sentences.  Social Interaction Social Interaction assist level: Interacts appropriately 75 - 89% of the time - Needs redirection for appropriate language or to initiate interaction.  Problem Solving Problem solving assist level: Solves basic 25 - 49% of the time - needs direction more than half the time to initiate, plan or complete simple activities  Memory Memory assist level: Recognizes or recalls 50 - 74% of the time/requires cueing 25 - 49% of the time    Medical Problem List and Plan: 1. Right hemiparesis and dysarthria/dysphagia secondary to left corona radiata,putamen,caudate infarct on 1/9  Cont CIR  Fluoxetine started 1/13 2. DVT Prophylaxis/Anticoagulation: Pharmaceutical: Lovenox  3. Pain Management: tylenol prn  4. Mood:   Has history of anxiety  Started prozac to help with mood and recovery.   LCSW to follow for evaluation and support.  5. Neuropsych: This patient is not fully capable of making decisions on her own behalf.  6. Skin/Wound Care: Routine pressure relief measures. Maintain adequate nutritional and hydration status.  7. Fluids/Electrolytes/Nutrition: Monitor I/O. Supplements between meals.  8. HTN: Monitor BP bid  Norvasc increased to 10mg  on 1/15  Hydralazine 10 started 1/17, increased to 25 on 1/18  Cont lisinopril and metoprolol bid.   9. Dysphagia: Continue dysphagia 1, honey liquids. Offer fluids between meals to maintain adequate hydration.  10. Dyslipidemia: on lipitor  11. Prediabetes: Hgb A1c- 6.0.   Modified diet to HH/CM. RD to educate patient and family on appropriate diet.   CBGs Controlled 1/18 12. GERD: Managed On Protonix.  13. Chronic constipation: Uses softners as well as suppository daily.   Started senna S at supper followed by suppository in am.  14. Emerging right spastic hemiparesis:   Ordered baclofen prn for now.   Resting hand splint and R-AFO.   ROM with therapy.   Reviewed stretching,positioning with family  8315. Hypokalemia  K+ 3.2 on 1/16  Supplement increased on 1/16  Cont to monitor  Labs ordered for tomorrow  Mag ordered as well for AM 16. Hypoalbuminemia  Supplement initiated 1/13 17. AKI  Cr. 1.24 on 1/16  Cont to monitor  IVF qHS started 1/16  Labs ordered for tomorrow 18. Leukocytosis  WBCs 11.0 on 1/16  Afebrile  Cont to monitor  Labs ordered for tomorrow 19. Hypernatremia  Na+ 148 on 1/16  Cont to monitor  Labs ordered for tomorrow  LOS (Days) 6 A  FACE TO FACE EVALUATION WAS PERFORMED  Ankit Karis Juba 03/06/2016 9:26 AM

## 2016-03-06 NOTE — Progress Notes (Signed)
Physical Therapy Session Note  Patient Details  Name: Christina Bradshaw MRN: [id number] Date of Birth: 10-06-1924  Today's Date: 03/06/2016 PT Individual Time: 0803-0830 PT Individual Time Calculation (min): 27 min   Short Term Goals: Week 1:  PT Short Term Goal 1 (Week 1): Pt will performe supine<>sit with Mod A  PT Short Term Goal 2 (Week 1): Pt will perform squat-pivot transfer with Mod A PT Short Term Goal 3 (Week 1): Pt will perform gait x25' with max A and LRAD PT Short Term Goal 4 (Week 1): Pt will perform WC propulsion x50' with S  Skilled Therapeutic Interventions/Progress Updates: Pt received supine in bed, denies pain and agreeable to treatment. Supine >sit with HOB elevated and minA; demonstrated to pt use of LLE to hook RLE to move to EOB. Required maxA for donning pants. Sit <>stand maxA and max/totalA standing balance while pt assisted pulling pants up using LUE. Squat pivot transfer bed>w/c maxA. Seated at sink pt washed face with setupA, donned dentures and brushed hair with modI and increased time. W/c propulsion x75' with L hemi technique and S. Remained seated in w/c at end of session with all needs in reach.      Therapy Documentation Precautions:  Precautions Precautions: Fall Precaution Comments: dense Rt hemiplegia Restrictions Weight Bearing Restrictions: No Pain:  Denies pain  See Function Navigator for Current Functional Status.   Therapy/Group: Individual Therapy  Vista Lawmanlizabeth J Tygielski 03/06/2016, 8:29 AM

## 2016-03-06 NOTE — Plan of Care (Signed)
Problem: RH BLADDER ELIMINATION Goal: RH STG MANAGE BLADDER WITH ASSISTANCE STG Manage Bladder With mod Assistance   Outcome: Not Progressing Remains incontinent of bladder, requiring use of brief.

## 2016-03-06 NOTE — Progress Notes (Signed)
Social Work Patient ID: Christina Bradshaw, female   DOB: 05-14-1924, 83 y.o.   MRN: [id number]   Met with pt and son, Christina Bradshaw, today to review team conference.  Both aware and agreeable with targeted d/c date of 2/7.  Discussed goals currently for min assist overall, however, made them aware that team is guarded about these and may need to downgrade next week.  Stressed to son that family/ caregivers need to be prepared to provide 24/7 physical assistance.  We did discuss option of SNF if family cannot manage care needs.  Son appreciative of my discussing this with the two of them and he is relaying information to his siblings.   Also, discussed with pt concerns of her emotional adjustment to this significant life change and provided support.  She would benefit from neuropsychology consult and will set up for next week if appropriate.  Family remains very devoted to pt but son admits concerns of care after CIR.  Continue to follow.  Aubrielle Stroud, LCSW

## 2016-03-07 ENCOUNTER — Inpatient Hospital Stay (HOSPITAL_COMMUNITY): Payer: Medicare Other

## 2016-03-07 ENCOUNTER — Inpatient Hospital Stay (HOSPITAL_COMMUNITY): Payer: Medicare Other | Admitting: Occupational Therapy

## 2016-03-07 ENCOUNTER — Inpatient Hospital Stay (HOSPITAL_COMMUNITY): Payer: Medicare Other | Admitting: Speech Pathology

## 2016-03-07 LAB — BASIC METABOLIC PANEL
ANION GAP: 8 (ref 5–15)
BUN: 39 mg/dL — ABNORMAL HIGH (ref 6–20)
CO2: 23 mmol/L (ref 22–32)
Calcium: 8.9 mg/dL (ref 8.9–10.3)
Chloride: 113 mmol/L — ABNORMAL HIGH (ref 101–111)
Creatinine, Ser: 1.04 mg/dL — ABNORMAL HIGH (ref 0.44–1.00)
GFR, EST AFRICAN AMERICAN: 54 mL/min — AB (ref >60)
GFR, EST NON AFRICAN AMERICAN: 46 mL/min — AB (ref >60)
Glucose, Bld: 99 mg/dL (ref 65–99)
POTASSIUM: 4.5 mmol/L (ref 3.5–5.1)
SODIUM: 144 mmol/L (ref 135–145)

## 2016-03-07 LAB — CBC
HEMATOCRIT: 43.6 % (ref 36.0–46.0)
Hemoglobin: 13.9 g/dL (ref 12.0–15.0)
MCH: 31 pg (ref 26.0–34.0)
MCHC: 31.9 g/dL (ref 30.0–36.0)
MCV: 97.1 fL (ref 78.0–100.0)
PLATELETS: 196 10*3/uL (ref 150–400)
RBC: 4.49 MIL/uL (ref 3.87–5.11)
RDW: 14.9 % (ref 11.5–15.5)
WBC: 12.4 10*3/uL — AB (ref 4.0–10.5)

## 2016-03-07 LAB — MAGNESIUM: MAGNESIUM: 2.1 mg/dL (ref 1.7–2.4)

## 2016-03-07 NOTE — Progress Notes (Signed)
Occupational Therapy Session Note  Patient Details  Name: Christina Bradshaw MRN: [id number] Date of Birth: [birth year]  Today's Date: 03/07/2016 OT Individual Time: 0830-0900 OT Individual Time Calculation (min): 30 min    Short Term Goals: Week 1:  OT Short Term Goal 1 (Week 1): Pt will complete sit > stand with max assist to complete LB dressing/hygiene OT Short Term Goal 2 (Week 1): Pt will complete toilet transfers with max assist 3 out of 4 attempts OT Short Term Goal 3 (Week 1): Pt will don pants with max assist of one caregiver OT Short Term Goal 4 (Week 1): Pt will position RUE during self-care tasks with min cues to increase awareness of positioning and decrease injury to shoulder  Skilled Therapeutic Interventions/Progress Updates:    Pt seen for OT session focusing on ADL re-training and functional sitting balance. Pt received in supine eating breakfast with NT present providing supervision for meal. Pt agreeable to tx session and denying pain. Pt agreeable to transfer to EOB to finish breakfast. Required assist for management of R UE/LE during transfer. Pt sat with supervision to cont meal, able to locate items on R side of tray independently and requiring min VCs to adhere to swallow precautions. LB dressing completed EOB with pt standing in STEADY with min A and assist for midline orientation while pants pulled up total A.  She transitioned into reclined to complete UB dressing, assist to doff clothing on L UE, able to remove shirt remainder of way. Pt able to recall hemi dressing technique for UB/LB dressing independently. She remained seated in recliner at end of session, all needs in reach. NT made aware of pt's position.   Therapy Documentation Precautions:  Precautions Precautions: Fall Precaution Comments: dense Rt hemiplegia Restrictions Weight Bearing Restrictions: No Pain:   No/ denies pain  See Function Navigator for Current Functional  Status.   Therapy/Group: Individual Therapy  Lewis, Graycen Sadlon C 03/07/2016, 7:07 AM

## 2016-03-07 NOTE — Progress Notes (Signed)
Occupational Therapy Session Note  Patient Details  Name: Christina Bradshaw MRN: [id number] Date of Birth: 1924/12/01  Today's Date: 03/07/2016 OT Individual Time: 1000-1110 OT Individual Time Calculation (min): 70 min    Short Term Goals: Week 1:  OT Short Term Goal 1 (Week 1): Pt will complete sit > stand with max assist to complete LB dressing/hygiene OT Short Term Goal 2 (Week 1): Pt will complete toilet transfers with max assist 3 out of 4 attempts OT Short Term Goal 3 (Week 1): Pt will don pants with max assist of one caregiver OT Short Term Goal 4 (Week 1): Pt will position RUE during self-care tasks with min cues to increase awareness of positioning and decrease injury to shoulder  Skilled Therapeutic Interventions/Progress Updates:    Pt resting in recliner upon arrival.  Pt acknowledged that she wanted to wash up but keep the same clothing on.  Pt declined removing socks and shoes to bathe feet.  Pt performed squat pivot transfer to L with max A and multimodal cues for positioning and sequencing.  Pt initiated bathing when provided with supplies.  Pt educated on hemi bathing techniques. Pt able to doff shirt when demonstrated most efficient method.  Pt required A with threading her RUE but able to complete remainder of task without assistance.  Pt stood at sink with STEADY to bathe periarea and change brief.  Pt incontinent of bladder with no awareness.  Pt required tot A to pull up pants.  Pt unable to use removed LUE as support on STEADY.  Pt returned to recliner, resting hand splint donned, and support provided under RUE.  Pt noted with significant RUE subluxation.  Focus on activity tolerance, following one step commands, task initiation, sequencing, sit<>stand, functional tranfsers, and safety awareness to increase independence with BADLS.   Therapy Documentation Precautions:  Precautions Precautions: Fall Precaution Comments: dense Rt hemiplegia Restrictions Weight Bearing  Restrictions: No   Pain:  Pt denied pain  See Function Navigator for Current Functional Status.   Therapy/Group: Individual Therapy  Rich BraveLanier, Maciah Feeback Chappell 03/07/2016, 11:11 AM

## 2016-03-07 NOTE — Progress Notes (Signed)
[place] PHYSICAL MEDICINE & REHABILITATION     PROGRESS NOTE  Subjective/Complaints:  Up at EOB eating breakfast. No issues per RN. Seems to be tolerating breakfast  ROS: Unable to obtain due to cognitive/mental status issues. .  Objective: Vital Signs: Blood pressure 135/62, pulse 60, temperature 98.5 F (36.9 C), temperature source Oral, resp. rate 18, height 5\' 7"  (1.702 m), weight 54.2 kg (119 lb 7.8 oz), SpO2 96 %. No results found.  Recent Labs  03/07/16 0451  WBC 12.4*  HGB 13.9  HCT 43.6  PLT 196    Recent Labs  03/07/16 0451  NA 144  K 4.5  CL 113*  GLUCOSE 99  BUN 39*  CREATININE 1.04*  CALCIUM 8.9   CBG (last 3)  No results for input(s): GLUCAP in the last 72 hours.  Wt Readings from Last 3 Encounters:  03/05/16 54.2 kg (119 lb 7.8 oz)  02/27/16 66.5 kg (146 lb 9.7 oz)  10/25/14 67.6 kg (149 lb)    Physical Exam:  BP 135/62 (BP Location: Left Arm)   Pulse 60   Temp 98.5 F (36.9 C) (Oral)   Resp 18   Ht 5\' 7"  (1.702 m)   Wt 54.2 kg (119 lb 7.8 oz)   SpO2 96%   BMI 18.71 kg/m  Constitutional: She appears well-developed and well-nourished.  HENT: Normocephalic and atraumatic.  Eyes: EOMI. No discharge.  Cardiovascular: RRR  Respiratory: Unlabored. Clear GI: Soft. Bowel sounds are normal.  Musculoskeletal: She exhibits no edema. No tenderness Neurological: She is alert.  Right facial weakness with severe dysarthria  Able to follow basic commands Motor: RUE 0/5 prox to distal--no change.  RLE: 2+/5 HF, 2+/5 KE, 0/5 ADF/DP. mAS 1/4 knee flexion, ADF Sensation diminished to light touch RUE/RLE Skin: Skin is warm and dry. Intact. Psychiatric: flat but cooperative.   Assessment/Plan: 1. Functional deficits secondary to left corona radiata,putamen,caudate infarct which require 3+ hours per day of interdisciplinary therapy in a comprehensive inpatient rehab setting. Physiatrist is providing close team supervision and 24 hour management of  active medical problems listed below. Physiatrist and rehab team continue to assess barriers to discharge/monitor patient progress toward functional and medical goals.  Function:  Bathing Bathing position   Position: Wheelchair/chair at sink  Bathing parts Body parts bathed by patient: Chest, Abdomen, Right arm, Front perineal area, Right upper leg, Left upper leg, Left lower leg Body parts bathed by helper: Right lower leg, Back, Buttocks, Left arm  Bathing assist Assist Level:  (max assist)      Upper Body Dressing/Undressing Upper body dressing   What is the patient wearing?: Pull over shirt/dress     Pull over shirt/dress - Perfomed by patient: Put head through opening, Thread/unthread right sleeve, Pull shirt over trunk Pull over shirt/dress - Perfomed by helper: Thread/unthread left sleeve        Upper body assist Assist Level:  (Mod assist)      Lower Body Dressing/Undressing Lower body dressing   What is the patient wearing?: Pants, Non-skid slipper socks, Shoes       Pants- Performed by helper: Thread/unthread right pants leg, Thread/unthread left pants leg, Pull pants up/down   Non-skid slipper socks- Performed by helper: Don/doff right sock, Don/doff left sock       Shoes - Performed by helper: Don/doff right shoe, Don/doff left shoe, Fasten right, Fasten left          Lower body assist Assist for lower body dressing:  (maxA)  Toileting Toileting Toileting activity did not occur: No continent bowel/bladder event Toileting steps completed by patient: Adjust clothing after toileting, Adjust clothing prior to toileting Toileting steps completed by helper: Performs perineal hygiene, Adjust clothing prior to toileting, Adjust clothing after toileting Toileting Assistive Devices: Grab bar or rail  Toileting assist Assist level: Touching or steadying assistance (Pt.75%) (total using Stedy)   Transfers Chair/bed transfer Chair/bed transfer activity did not  occur: N/A Chair/bed transfer method: Squat pivot Chair/bed transfer assist level: Maximal assist (Pt 25 - 49%/lift and lower) Chair/bed transfer assistive device: Bedrails Mechanical lift: Landscape architecttedy   Locomotion Ambulation     Max distance: 110ft Assist level: Maximal assist (Pt 25 - 49%)   Wheelchair   Type: Manual Max wheelchair distance: 50 Assist Level: Supervision or verbal cues  Cognition Comprehension Comprehension assist level: Understands basic 90% of the time/cues < 10% of the time  Expression Expression assist level: Expresses basic 50 - 74% of the time/requires cueing 25 - 49% of the time. Needs to repeat parts of sentences.  Social Interaction Social Interaction assist level: Interacts appropriately 75 - 89% of the time - Needs redirection for appropriate language or to initiate interaction.  Problem Solving Problem solving assist level: Solves basic 50 - 74% of the time/requires cueing 25 - 49% of the time  Memory Memory assist level: Recognizes or recalls 50 - 74% of the time/requires cueing 25 - 49% of the time    Medical Problem List and Plan: 1. Right hemiparesis and dysarthria/dysphagia secondary to left corona radiata,putamen,caudate infarct on 1/9  Cont CIR therapies 2. DVT Prophylaxis/Anticoagulation: Pharmaceutical: Lovenox  3. Pain Management: tylenol prn  4. Mood:   Has history of anxiety  Started prozac to help with mood and recovery.   LCSW to follow for evaluation and support.  5. Neuropsych: This patient is not fully capable of making decisions on her own behalf.  6. Skin/Wound Care: Routine pressure relief measures. Maintain adequate nutritional and hydration status.  7. Fluids/Electrolytes/Nutrition: Monitor I/O. Supplements between meals.  8. HTN: fair control at present  Norvasc increased to 10mg  on 1/15  Hydralazine 10 started 1/17, increased to 25 on 1/18  Cont lisinopril and metoprolol bid.  9. Dysphagia: Continue dysphagia 1, honey liquids.  Offer fluids between meals to maintain adequate hydration.  10. Dyslipidemia: on lipitor  11. Prediabetes: Hgb A1c- 6.0.   Modified diet to HH/CM. RD to educate patient and family on appropriate diet.   CBGs Controlled 1/18 12. GERD: Managed On Protonix.  13. Chronic constipation: Uses softners as well as suppository daily.   Started senna S at supper followed by suppository in am.  14. Emerging right spastic hemiparesis:   continue baclofen prn for now.   Resting hand splint and R-AFO.   ROM with therapy.   Reviewed stretching,positioning with family  3915. Hypokalemia  K+ 4.5 today  Supplement increased on 1/16  Cont to monitor  Mg+ pending 16. Hypoalbuminemia  Supplement initiated 1/13 17. AKI  Cr. 1.04 today and trending down  Cont to monitor  IVF qHS started 1/16---continue for now   18. Leukocytosis  WBCs 12.4 today---no signs of infection  Afebrile  Cont to follow 19. Hypernatremia  Na+ 144 today  Cont to monitor    LOS (Days) 7 A FACE TO FACE EVALUATION WAS PERFORMED  Ramesh Moan T 03/07/2016 9:10 AM

## 2016-03-07 NOTE — Progress Notes (Signed)
Speech Language Pathology Weekly Progress and Session Note  Patient Details  Name: Christina Bradshaw MRN: [id number] Date of Birth: 11-Aug-1924  Beginning of progress report period: February 29, 2016   End of progress report period: March 07, 2016   Today's Date: 03/07/2016 SLP Individual Time: 2694-8546 SLP Individual Time Calculation (min): 55 min  Short Term Goals: Week 1: SLP Short Term Goal 1 (Week 1): Patient will consume current diet with minimal overt s/s of aspiration with Mod A verbal cues for use of swallowing compensatory strategies.  SLP Short Term Goal 1 - Progress (Week 1): Met SLP Short Term Goal 2 (Week 1): Patient will consume trials of nectar-thick liquids with minimal overt s/s of aspiration over 2 sessions prior to upgrade or repeat objective assessment.  SLP Short Term Goal 2 - Progress (Week 1): Progressing toward goal SLP Short Term Goal 3 (Week 1): Patient will utilize speech intelligibility strategies at the phrase level with Mod A multimodal cues.  SLP Short Term Goal 3 - Progress (Week 1): Met SLP Short Term Goal 4 (Week 1): Patient will utilize word-finding strateiges during functional tasks/conversations with Min A multimodal cues.  SLP Short Term Goal 4 - Progress (Week 1): Met SLP Short Term Goal 5 (Week 1): Patient will demonstrate functional problem solving for basic and familiar tasks with Mod A multimodal cues.  SLP Short Term Goal 5 - Progress (Week 1): Progressing toward goal SLP Short Term Goal 6 (Week 1): Patient will recall new, daily information with Mod A multimodal cues.  SLP Short Term Goal 6 - Progress (Week 1): Met    New Short Term Goals: Week 2: SLP Short Term Goal 1 (Week 2): Patient will consume current diet with minimal overt s/s of aspiration with Min A verbal cues for use of swallowing compensatory strategies.  SLP Short Term Goal 2 (Week 2): Patient will consume trials of nectar-thick liquids with minimal overt s/s of aspiration over  2 sessions prior to upgrade or repeat objective assessment.  SLP Short Term Goal 3 (Week 2): Patient will utilize speech intelligibility strategies at the phrase level with Min A multimodal cues.  SLP Short Term Goal 4 (Week 2): Patient will utilize word-finding strateiges during functional tasks/conversations with Min A verbal cues.  SLP Short Term Goal 5 (Week 2): Patient will demonstrate functional problem solving for basic and familiar tasks with Mod A multimodal cues.  SLP Short Term Goal 6 (Week 2): Patient will recall new, daily information with Mod A verbal cues.   Weekly Progress Updates: Pt has made functional gains this reporting period and has met 4 out of 6 short term goals.  Pt is currently consuming dys 1 textures and honey thick liquids with mod assist cues for use of swallowing precautions.  Pt is also min-mod assist for functional communication due to word finding impairment and dysarthria.  Pt needs mod-max assist during functional tasks due to moderate cognitive impairment characterized by decreased problem solving, awareness, and recall of information.  Pt and family education is ongoing.   Given the abovementioned deficits, pt would continue to benefit from skilled ST while inpatient in order to maximize functional independence and reduce burden of care prior to discharge.       Intensity: Minumum of 1-2 x/day, 30 to 90 minutes Frequency: 3 to 5 out of 7 days Duration/Length of Stay: 3.5 weeks  Treatment/Interventions: Cognitive remediation/compensation;Cueing hierarchy;Functional tasks;Patient/family education;Therapeutic Activities;Internal/external aids;Speech/Language facilitation;Environmental controls   Daily Session  Skilled Therapeutic Interventions: Pt  was seen for skilled ST targeting cognitive goals.  SLP facilitated the session with a basic money management task to address functional problem solving and functional recall.  Pt required mod-max assist multimodal cues  for counting money and making change due to decreased working memory of verbally presented figures.  Use of written aids was only minimally effective for compensating for working memory deficits due to decreased task organization and error awareness.  Pt's son was present and verified that pt handled all her finances prior to admission; however, pt reports using credit cards for day to day transactions such as buying groceries.  SLP also facilitated the session with a novel card game.  Pt initially needed mod assist verbal and visual cues for recall of task protocols and procedures in order to plan and execute a problem solving strategy; however as task progressed and with repetition of information SLP was able to fade cues to min assist verbal and visual cues.   Pt was returned to room and left in bed with son at bedside.  Goals updated on this date to reflect current progress and plan of care.      Function:   Eating Eating              Cognition Comprehension Comprehension assist level: Understands basic 90% of the time/cues < 10% of the time  Expression   Expression assist level: Expresses basic 50 - 74% of the time/requires cueing 25 - 49% of the time. Needs to repeat parts of sentences.  Social Interaction Social Interaction assist level: Interacts appropriately 75 - 89% of the time - Needs redirection for appropriate language or to initiate interaction.  Problem Solving Problem solving assist level: Solves basic 50 - 74% of the time/requires cueing 25 - 49% of the time  Memory Memory assist level: Recognizes or recalls 50 - 74% of the time/requires cueing 25 - 49% of the time   General    Pain Pain Assessment Pain Assessment: No/denies pain  Therapy/Group: Individual Therapy  Shann Lewellyn, Selinda Orion 03/07/2016, 4:00 PM

## 2016-03-07 NOTE — Progress Notes (Signed)
Social Work Patient ID: Christina Bradshaw, female   DOB: 04-13-1924, 83 y.o.   MRN: [id number]  Anselm Pancoast, LCSW Social Worker Signed   Patient Care Conference Date of Service: 03/06/2016 12:04 PM      Hide copied text Hover for attribution information Inpatient RehabilitationTeam Conference and Plan of Care Update Date: 03/05/2016   Time: 2:45 PM      Patient Name: Christina Bradshaw      Medical Record Number: [id number]  Date of Birth: [birth year] Sex: Female         Room/Bed: 4M01C/4M01C-01 Payor Info: Payor: MEDICARE / Plan: MEDICARE PART A AND B / Product Type: *No Product type* /     Admitting Diagnosis: l cva  Admit Date/Time:  02/29/2016  3:34 PM Admission Comments: No comment available    Primary Diagnosis:  Left sided cerebral hemisphere cerebrovascular accident (CVA) (HCC) Principal Problem: Left sided cerebral hemisphere cerebrovascular accident (CVA) [facility])       Patient Active Problem List    Diagnosis Date Noted  . Hypernatremia    . Leukocytosis    . AKI (acute kidney injury) (HCC)    . Labile blood pressure    . Prediabetes    . Hypokalemia    . Hypoalbuminemia due to protein-calorie malnutrition (HCC)    . Stage 3 chronic kidney disease    . Left sided cerebral hemisphere cerebrovascular accident (CVA) (HCC) 02/29/2016  . Spastic hemiparesis of right dominant side due to cerebral infarction (HCC) 02/29/2016  . Dysphagia 02/29/2016  . Essential hypertension 02/29/2016  . TIA (transient ischemic attack) 02/27/2016  . CVA (cerebral vascular accident) (HCC) 02/27/2016      Expected Discharge Date: Expected Discharge Date: 03/26/16   Team Members Present: Physician leading conference: Dr. Maryla Morrow Social Worker Present: Amada Jupiter, LCSW Nurse Present: Kennon Portela, RN PT Present: Katherine Mantle, PT;Other (comment) (Rosita Dechalus, PTA) OT Present: Callie Fielding, OT SLP Present: Fae Pippin, SLP PPS Coordinator present : Edson Snowball, PT    Current Status/Progress Goal Weekly Team Focus  Medical     Right hemiparesis and dysarthria/dysphagia secondary to left corona radiata,putamen,caudate infarct on 1/9  Improve mobility, safety, transfers, comorbid conditions  See above   Bowel/Bladder     Incontnent of bowel and bladder. LBM 03/06/16  Managed bowel and bladder  Timed toileting q 2hrs   Swallow/Nutrition/ Hydration     Dys 1, honey thick liquids   supervision   toleration of current diet and trials of advanced textures   ADL's     mod A bathing, min A donning shirt, total A pants, incontinent, max A sit to stand and squat pivot transfers, RUE flaccid  min standing, min tub bench transfers, bathing, dressing, toileting  ADL retraining, functional mobility, balance, RUE management and NMR, R visual scanning   Mobility     maxA transfers, maxA gait 5-62ft with dependent placement of RLE  MinA transfers, and gait 26ft. Stair management x 5 with 1 rail  Bed mobilty, squat pivot transfers, pre-gait activities, Balance    Communication     mod assist   min assist-supervision    education and carryover of compensatory strategies   Safety/Cognition/ Behavioral Observations   mod-max assist   min assist   basic cognition    Pain     No c/o pain   <3  Monitor for nonverbal cues of pain   Skin     CDI  CDI  Assess q shift, and encourage to  turn q2hrs     Rehab Goals Patient on target to meet rehab goals: Yes *See Care Plan and progress notes for long and short-term goals.   Barriers to Discharge: Mobiliy, safety, transfers, HTN, prediabetes, hypokalemia, hypernatremia, AKI, leukocytosis     Possible Resolutions to Barriers:  Follow labs, IVF qHS, K+ supplementation, therapies     Discharge Planning/Teaching Needs:  Pt to dc home with family to arrange 24/7 assistance -   TBD   Team Discussion:  BP meds being adjusted; IVF at night and watching labs closely. incont b/b and recommeding timed toiletin.  Pt with NO AWRENESS  of b/b issues.  theapies all feel that min assist goals overall may be lofty.  Currently mod - total assistance.  Diet has been downgraded as well.  SW to follow up with family but may need SNF.  Revisions to Treatment Plan:  None but tx goals may be downgraded    Continued Need for Acute Rehabilitation Level of Care: The patient requires daily medical management by a physician with specialized training in physical medicine and rehabilitation for the following conditions: Daily direction of a multidisciplinary physical rehabilitation program to ensure safe treatment while eliciting the highest outcome that is of practical value to the patient.: Yes Daily medical management of patient stability for increased activity during participation in an intensive rehabilitation regime.: Yes Daily analysis of laboratory values and/or radiology reports with any subsequent need for medication adjustment of medical intervention for : Neurological problems;Diabetes problems;Blood pressure problems;Renal problems   [name] 03/07/2016, 9:21 AM

## 2016-03-07 NOTE — Progress Notes (Signed)
Physical Therapy Weekly Progress Note  Patient Details  Name: Christina Bradshaw MRN: [id number] Date of Birth: [birth year]  Beginning of progress report period: March 01, 2016 End of progress report period: March 07, 2016  Today's Date: 03/07/2016 PT Individual Time: 1345-1430 PT Individual Time Calculation (min): 45 min  Session limited due to upset stomach but pt willing to participate in bed level activities to tolerance initially and by end of session agreeable to transfer OOB for SLP session. Completed supine therex with handout provided with active strengthening on LLE and PROM on RLE for stretching and flexibility. Trace movement noted at quad on R side during exercises. Tight adductors on RLE noted. Performed ankle PF/DF, heel slide, hip abduction and adduction (in supine and clamshell positions), and quad sets x 10 reps each. Neuro re-ed for bed mobility re-training for supine -> sit, sitting balance EOB to reorient to midline, and introduced slideboard transfer training. Pt required mod assist for transfer with max cues for sequencing, head/hips relationship, and technique. Left up in w/c awaiting SLP for next session.   Patient has met 2 of 4 short term goals.  Pt is making slow progress with mobility, still requiring max assist for functional transfers and standing. Pt with dense R hemiplegia. Pt's son reports they are willing to take her home if she can progress further functionally, otherwise may require SNF for further rehab.   Patient continues to demonstrate the following deficits muscle weakness, muscle joint tightness and muscle paralysis, decreased cardiorespiratoy endurance, abnormal tone and decreased coordination, decreased midline orientation and decreased attention to right, decreased initiation, decreased attention, decreased awareness, decreased problem solving, decreased safety awareness and decreased memory and decreased sitting balance, decreased standing balance,  decreased postural control, hemiplegia and decreased balance strategies and therefore will continue to benefit from skilled PT intervention to increase functional independence with mobility.  Patient progressing toward long term goals..  Continue plan of care. Will continue to monitor progress with standing/gait this upcoming week but anticipate gait goals may need to be downgraded. Discharged stair goal at this time due to very slow progress and son reports he has ramp access in his home (one of her options for discharge).   PT Short Term Goals Week 1:  PT Short Term Goal 1 (Week 1): Pt will performe supine<>sit with Mod A  PT Short Term Goal 1 - Progress (Week 1): Met PT Short Term Goal 2 (Week 1): Pt will perform squat-pivot transfer with Mod A PT Short Term Goal 2 - Progress (Week 1): Not met (requires max assist) PT Short Term Goal 3 (Week 1): Pt will perform gait x25' with max A and LRAD PT Short Term Goal 3 - Progress (Week 1): Not met (max distance 15') PT Short Term Goal 4 (Week 1): Pt will perform WC propulsion x50' with S PT Short Term Goal 4 - Progress (Week 1): Met Week 2:  PT Short Term Goal 1 (Week 2): Pt will be able to perform functional transfers with mod assist PT Short Term Goal 2 (Week 2): Pt will be able to progress sit <> stand with mod assist PT Short Term Goal 3 (Week 2): Pt will be able to gait x 25' with max assist  PT Short Term Goal 4 (Week 2): Pt will be able to propel w/c with hemi-technique x 100' with supervision  Skilled Therapeutic Interventions/Progress Updates:  Ambulation/gait training;Balance/vestibular training;Cognitive remediation/compensation;Discharge planning;Disease management/prevention;DME/adaptive equipment instruction;Functional mobility training;Neuromuscular re-education;Pain management;Patient/family education;Psychosocial support;Skin care/wound management;Splinting/orthotics;Stair training;Therapeutic Activities;Therapeutic Exercise;UE/LE  Strength taining/ROM;UE/LE Coordination activities;Visual/perceptual remediation/compensation;Wheelchair propulsion/positioning   Therapy Documentation Precautions:  Precautions Precautions: Fall Precaution Comments: dense Rt hemiplegia Restrictions Weight Bearing Restrictions: No  Pain: Reports upset stomach and moving bowels this afternoon. Overall reports not feeling great.   See Function Navigator for Current Functional Status.  Therapy/Group: Individual Therapy  Canary Brim Ivory Broad, PT, DPT  03/07/2016, 4:00 PM

## 2016-03-08 ENCOUNTER — Inpatient Hospital Stay (HOSPITAL_COMMUNITY): Payer: Medicare Other | Admitting: Speech Pathology

## 2016-03-08 ENCOUNTER — Inpatient Hospital Stay (HOSPITAL_COMMUNITY): Payer: Medicare Other

## 2016-03-08 NOTE — Progress Notes (Signed)
[place] PHYSICAL MEDICINE & REHABILITATION     PROGRESS NOTE  Subjective/Complaints:  Pt lying in bed without complaints. RN reports elevated BP (before meds) and flushing of face  ROS: limited due to cognitive/mental status issues.  .  Objective: Vital Signs: Blood pressure (!) 173/79, pulse 68, temperature 98 F (36.7 C), temperature source Oral, resp. rate 16, height 5\' 7"  (1.702 m), weight 54.2 kg (119 lb 7.8 oz), SpO2 96 %. No results found.  Recent Labs  03/07/16 0451  WBC 12.4*  HGB 13.9  HCT 43.6  PLT 196    Recent Labs  03/07/16 0451  NA 144  K 4.5  CL 113*  GLUCOSE 99  BUN 39*  CREATININE 1.04*  CALCIUM 8.9   CBG (last 3)  No results for input(s): GLUCAP in the last 72 hours.  Wt Readings from Last 3 Encounters:  03/05/16 54.2 kg (119 lb 7.8 oz)  02/27/16 66.5 kg (146 lb 9.7 oz)  10/25/14 67.6 kg (149 lb)    Physical Exam:  BP (!) 173/79 (BP Location: Left Arm)   Pulse 68   Temp 98 F (36.7 C) (Oral)   Resp 16   Ht 5\' 7"  (1.702 m)   Wt 54.2 kg (119 lb 7.8 oz)   SpO2 96%   BMI 18.71 kg/m  Constitutional: She appears well-developed and well-nourished.  HENT: Normocephalic and atraumatic.  Eyes: EOMI. No discharge.  Cardiovascular: RRR Respiratory: clear GI: Soft. Bowel sounds are normal.  Musculoskeletal: She exhibits no edema. No tenderness Neurological: She is alert.  Right facial weakness with severe dysarthria--intelligible with extra time Able to follow basic commands Motor: RUE 0/5 prox to distal--no change.  RLE: 2+/5 HF, 2+/5 KE, 0/5 ADF/DP. mAS 1/4 knee flexion, ADF Sensation diminished to light touch RUE/RLE (no change) Skin: Skin is warm and dry. Intact. Psychiatric:   cooperative.   Assessment/Plan: 1. Functional deficits secondary to left corona radiata,putamen,caudate infarct which require 3+ hours per day of interdisciplinary therapy in a comprehensive inpatient rehab setting. Physiatrist is providing close team  supervision and 24 hour management of active medical problems listed below. Physiatrist and rehab team continue to assess barriers to discharge/monitor patient progress toward functional and medical goals.  Function:  Bathing Bathing position   Position: Wheelchair/chair at sink  Bathing parts Body parts bathed by patient: Chest, Abdomen, Right arm, Front perineal area, Right upper leg, Left upper leg, Left lower leg Body parts bathed by helper: Left arm, Buttocks, Right lower leg, Left lower leg, Back  Bathing assist Assist Level:  (max assist)      Upper Body Dressing/Undressing Upper body dressing   What is the patient wearing?: Pull over shirt/dress     Pull over shirt/dress - Perfomed by patient: Put head through opening, Pull shirt over trunk, Thread/unthread left sleeve Pull over shirt/dress - Perfomed by helper: Thread/unthread right sleeve        Upper body assist Assist Level: Touching or steadying assistance(Pt > 75%)      Lower Body Dressing/Undressing Lower body dressing Lower body dressing/undressing activity did not occur: N/A What is the patient wearing?: Pants, Shoes, Socks     Pants- Performed by patient: Thread/unthread left pants leg Pants- Performed by helper: Thread/unthread right pants leg, Pull pants up/down   Non-skid slipper socks- Performed by helper: Don/doff right sock, Don/doff left sock       Shoes - Performed by helper: Don/doff right shoe, Don/doff left shoe, Fasten right, Fasten left  Lower body assist Assist for lower body dressing:  (Standing in STEADY)      Toileting Toileting Toileting activity did not occur: No continent bowel/bladder event Toileting steps completed by patient: Adjust clothing after toileting, Adjust clothing prior to toileting Toileting steps completed by helper: Adjust clothing prior to toileting, Performs perineal hygiene, Adjust clothing after toileting Toileting Assistive Devices: Grab bar or rail   Toileting assist Assist level: Touching or steadying assistance (Pt.75%) (total using Stedy)   Transfers Chair/bed transfer Chair/bed transfer activity did not occur: N/A Chair/bed transfer method: Lateral scoot Chair/bed transfer assist level: Moderate assist (Pt 50 - 74%/lift or lower) Chair/bed transfer assistive device: Sliding board, Armrests Mechanical lift: Landscape architecttedy   Locomotion Ambulation     Max distance: 4610ft Assist level: Maximal assist (Pt 25 - 49%)   Wheelchair   Type: Manual Max wheelchair distance: 50 Assist Level: Supervision or verbal cues  Cognition Comprehension Comprehension assist level: Understands basic 90% of the time/cues < 10% of the time  Expression Expression assist level: Expresses basic 25 - 49% of the time/requires cueing 50 - 75% of the time. Uses single words/gestures.  Social Interaction Social Interaction assist level: Interacts appropriately 75 - 89% of the time - Needs redirection for appropriate language or to initiate interaction.  Problem Solving Problem solving assist level: Solves basic 25 - 49% of the time - needs direction more than half the time to initiate, plan or complete simple activities  Memory Memory assist level: Recognizes or recalls 50 - 74% of the time/requires cueing 25 - 49% of the time    Medical Problem List and Plan: 1. Right hemiparesis and dysarthria/dysphagia secondary to left corona radiata,putamen,caudate infarct on 1/9  Cont CIR therapies 2. DVT Prophylaxis/Anticoagulation: Pharmaceutical: Lovenox  3. Pain Management: tylenol prn  4. Mood:   Has history of anxiety  Started prozac to help with mood and recovery.   LCSW to follow for evaluation and support.  5. Neuropsych: This patient is not fully capable of making decisions on her own behalf.  6. Skin/Wound Care: Routine pressure relief measures. Maintain adequate nutritional and hydration status.  7. Fluids/Electrolytes/Nutrition: Monitor I/O. Supplements between  meals.  8. HTN: fair control at present  Norvasc increased to 10mg  on 1/15  Hydralazine  increased to 25 on 1/18   Cont lisinopril and metoprolol bid.   Overall improved control. Re-check bp after meds this morning 9. Dysphagia: Continue dysphagia 1, honey liquids. Offer fluids between meals to maintain adequate hydration.  10. Dyslipidemia: on lipitor  11. Prediabetes: Hgb A1c- 6.0.   Modified diet to HH/CM. RD to educate patient and family on appropriate diet.   CBGs Controlled 1/18 12. GERD: Managed On Protonix.  13. Chronic constipation: Uses softners as well as suppository daily.   Started senna S at supper followed by suppository in am.  14. Emerging right spastic hemiparesis:   continue baclofen prn for now.   Resting hand splint and R-AFO.   ROM with therapy.   Reviewed stretching,positioning with family  415. Hypokalemia  K+ 4.5    Supplement increased on 1/16  Cont to monitor  Mg+ 2.1 16. Hypoalbuminemia  Supplement initiated 1/13 17. AKI  Cr. 1.04 on Friday  IVF qHS started 1/16---continue while on honey liq  -recheck monday   18. Leukocytosis  WBCs 12.4    Afebrile  Re-check monday 19. Hypernatremia  Na+ 144   Cont to monitor    LOS (Days) 8 A FACE TO FACE EVALUATION WAS PERFORMED  Shiya Fogelman T 03/08/2016 8:07 AM

## 2016-03-08 NOTE — Progress Notes (Signed)
Speech Language Pathology Daily Session Note  Patient Details  Name: Denita LungMildred C [place]nefield MRN: [id number] Date of Birth: 09/28/1924  Today's Date: 03/08/2016 SLP Individual Time: [id number] SLP Individual Time Calculation (min): 44 min  Short Term Goals: Week 2: SLP Short Term Goal 1 (Week 2): Patient will consume current diet with minimal overt s/s of aspiration with Min A verbal cues for use of swallowing compensatory strategies.  SLP Short Term Goal 2 (Week 2): Patient will consume trials of nectar-thick liquids with minimal overt s/s of aspiration over 2 sessions prior to upgrade or repeat objective assessment.  SLP Short Term Goal 3 (Week 2): Patient will utilize speech intelligibility strategies at the phrase level with Min A multimodal cues.  SLP Short Term Goal 4 (Week 2): Patient will utilize word-finding strateiges during functional tasks/conversations with Min A verbal cues.  SLP Short Term Goal 5 (Week 2): Patient will demonstrate functional problem solving for basic and familiar tasks with Mod A multimodal cues.  SLP Short Term Goal 6 (Week 2): Patient will recall new, daily information with Mod A verbal cues.   Skilled Therapeutic Interventions: Pt was seen for skilled ST targeting goals for dysphagia.  Pt consumed dys 1 textures and honey thick liquids with supervision verbal cues for rate and portion control.  Pt demonstrated no overt s/s of aspiration with purees or honey thick liquids and was able to clear all but mild, diffuse residuals from the oral cavity without difficulty.  Given improvements noted at bedside with current diet, SLP facilitated the session with trials of advanced textures to continue working towards diet progression.  Pt demonstrated appropriate mastication and oral clearance of upgraded solids with supervision.  Recommend continuing trials of dys 2 textures with SLP to ensure pt's ability to consistently tolerate upgrade over 1-2 consecutive sessions.  Pt left in  bed with son at bedside.  Continue per current plan of care.       Function:  Eating Eating   Modified Consistency Diet: Yes Eating Assist Level: Supervision or verbal cues;Set up assist for   Eating Set Up Assist For: Opening containers       Cognition Comprehension Comprehension assist level: Understands basic 90% of the time/cues < 10% of the time  Expression   Expression assist level: Expresses basic 50 - 74% of the time/requires cueing 25 - 49% of the time. Needs to repeat parts of sentences.  Social Interaction Social Interaction assist level: Interacts appropriately 75 - 89% of the time - Needs redirection for appropriate language or to initiate interaction.  Problem Solving Problem solving assist level: Solves basic 25 - 49% of the time - needs direction more than half the time to initiate, plan or complete simple activities  Memory Memory assist level: Recognizes or recalls 50 - 74% of the time/requires cueing 25 - 49% of the time    Pain Pain Assessment Pain Assessment: No/denies pain  Therapy/Group: Individual Therapy  Norell Brisbin, Melanee SpryNicole L 03/08/2016, 12:18 PM

## 2016-03-08 NOTE — Progress Notes (Signed)
Physical Therapy Session Note  Patient Details  Name: Christina Bradshaw MRN: [id number] Date of Birth: [birth year]  Today's Date: 03/08/2016 PT Individual Time: 1435-1505 PT Individual Time Calculation (min): 30 min   Short Term Goals: Week 2:  PT Short Term Goal 1 (Week 2): Pt will be able to perform functional transfers with mod assist PT Short Term Goal 2 (Week 2): Pt will be able to progress sit <> stand with mod assist PT Short Term Goal 3 (Week 2): Pt will be able to gait x 25' with max assist  PT Short Term Goal 4 (Week 2): Pt will be able to propel w/c with hemi-technique x 100' with supervision  Skilled Therapeutic Interventions/Progress Updates:   Pt in bed requesting to attempt to use bathroom due to bloated and upset stomach. Bed mobility re-training with focus on technique with cues for attention to RUE and technique. Using STEDY, pt able to sit -> stand with mod assist from bed (slightly elevated) and use to transfer to elevated toilet seat. Required min to mod assist for balance during clothing management and hygiene with cues for reorientation to midline with pt losing balance to the R. Pt with soiled brief of urine but able to have a continent BM in toilet. Pt set up in w/c with transfer via Stedy with max assist for sit -> stand from elevated toilet seat at end of session with daughter in room.   Therapy Documentation Precautions:  Precautions Precautions: Fall Precaution Comments: dense Rt hemiplegia Restrictions Weight Bearing Restrictions: No  Pain: C/o upset stomach and bloating. RN aware.    See Function Navigator for Current Functional Status.   Therapy/Group: Individual Therapy  Christina Bradshaw, Christina Langlois [name]  [name] B. Delmus Warwick, PT, DPT  03/08/2016, 3:20 PM

## 2016-03-08 NOTE — Progress Notes (Signed)
Patient was shifting in her seat when R arm fell off tray table and hung down side of wheelchair. Family member reported to RN after patient complained of pain 30 min later. ROM performed and did not increase pain. Tylenol given, will continue to monitor.

## 2016-03-09 ENCOUNTER — Inpatient Hospital Stay (HOSPITAL_COMMUNITY): Payer: Medicare Other | Admitting: Occupational Therapy

## 2016-03-09 LAB — BASIC METABOLIC PANEL
ANION GAP: 6 (ref 5–15)
BUN: 29 mg/dL — AB (ref 6–20)
CALCIUM: 8.8 mg/dL — AB (ref 8.9–10.3)
CO2: 22 mmol/L (ref 22–32)
Chloride: 112 mmol/L — ABNORMAL HIGH (ref 101–111)
Creatinine, Ser: 0.97 mg/dL (ref 0.44–1.00)
GFR calc Af Amer: 58 mL/min — ABNORMAL LOW (ref >60)
GFR calc non Af Amer: 50 mL/min — ABNORMAL LOW (ref >60)
GLUCOSE: 96 mg/dL (ref 65–99)
POTASSIUM: 5.1 mmol/L (ref 3.5–5.1)
Sodium: 140 mmol/L (ref 135–145)

## 2016-03-09 NOTE — Progress Notes (Signed)
[facility] PHYSICAL MEDICINE & REHABILITATION     PROGRESS NOTE  Subjective/Complaints:  Right arm pain after arm fell off arm rest of w/c yesterday. No issues with the arm this morning  ROS: pt denies nausea, vomiting, diarrhea, cough, shortness of breath or chest pain   .  Objective: Vital Signs: Blood pressure (!) 167/75, pulse 60, temperature 97.5 F (36.4 C), temperature source Oral, resp. rate 18, height 5\' 7"  (1.702 m), weight 54.2 kg (119 lb 7.8 oz), SpO2 100 %. No results found.  Recent Labs  03/07/16 0451  WBC 12.4*  HGB 13.9  HCT 43.6  PLT 196    Recent Labs  03/07/16 0451 03/09/16 0502  NA 144 140  K 4.5 5.1  CL 113* 112*  GLUCOSE 99 96  BUN 39* 29*  CREATININE 1.04* 0.97  CALCIUM 8.9 8.8*   CBG (last 3)  No results for input(s): GLUCAP in the last 72 hours.  Wt Readings from Last 3 Encounters:  03/05/16 54.2 kg (119 lb 7.8 oz)  02/27/16 66.5 kg (146 lb 9.7 oz)  10/25/14 67.6 kg (149 lb)    Physical Exam:  BP (!) 167/75   Pulse 60   Temp 97.5 F (36.4 C) (Oral)   Resp 18   Ht 5\' 7"  (1.702 m)   Wt 54.2 kg (119 lb 7.8 oz)   SpO2 100%   BMI 18.71 kg/m  Constitutional: She appears well-developed and well-nourished.  HENT: Normocephalic and atraumatic.  Eyes: EOMI. No discharge.  Cardiovascular: RRR Respiratory: clear GI: Soft. Bowel sounds are normal.  Musculoskeletal: She exhibits no edema. No tenderness in RUE Neurological: She is alert.  Right facial weakness with severe dysarthria--intelligible with extra time Able to follow basic commands Motor: RUE 0/5 prox to distal--without change.  RLE: 2+/5 HF, 2+/5 KE, 0/5 ADF/DP. mAS tr -1/4 knee flexion, ADF Sensation diminished to light touch RUE/RLE (no change) Skin: Skin is warm and dry. Intact. Psychiatric:   cooperative.   Assessment/Plan: 1. Functional deficits secondary to left corona radiata,putamen,caudate infarct which require 3+ hours per day of interdisciplinary therapy in a  comprehensive inpatient rehab setting. Physiatrist is providing close team supervision and 24 hour management of active medical problems listed below. Physiatrist and rehab team continue to assess barriers to discharge/monitor patient progress toward functional and medical goals.  Function:  Bathing Bathing position   Position: Wheelchair/chair at sink  Bathing parts Body parts bathed by patient: Chest, Abdomen, Right arm, Front perineal area, Right upper leg, Left upper leg, Left lower leg Body parts bathed by helper: Left arm, Buttocks, Right lower leg, Left lower leg, Back  Bathing assist Assist Level:  (max assist)      Upper Body Dressing/Undressing Upper body dressing   What is the patient wearing?: Pull over shirt/dress     Pull over shirt/dress - Perfomed by patient: Put head through opening, Pull shirt over trunk, Thread/unthread left sleeve Pull over shirt/dress - Perfomed by helper: Thread/unthread right sleeve        Upper body assist Assist Level: Touching or steadying assistance(Pt > 75%)      Lower Body Dressing/Undressing Lower body dressing Lower body dressing/undressing activity did not occur: N/A What is the patient wearing?: Pants, Shoes, Socks     Pants- Performed by patient: Thread/unthread left pants leg Pants- Performed by helper: Thread/unthread right pants leg, Pull pants up/down   Non-skid slipper socks- Performed by helper: Don/doff right sock, Don/doff left sock       Shoes -  Performed by helper: Don/doff right shoe, Don/doff left shoe, Fasten right, Fasten left          Lower body assist Assist for lower body dressing:  (Standing in STEADY)      Toileting Toileting Toileting activity did not occur: No continent bowel/bladder event Toileting steps completed by patient: Adjust clothing after toileting, Adjust clothing prior to toileting Toileting steps completed by helper: Adjust clothing prior to toileting, Performs perineal hygiene,  Adjust clothing after toileting Toileting Assistive Devices: Grab bar or rail  Toileting assist Assist level: Touching or steadying assistance (Pt.75%)   Transfers Chair/bed transfer Chair/bed transfer activity did not occur: N/A Chair/bed transfer method: Lateral scoot Chair/bed transfer assist level: Moderate assist (Pt 50 - 74%/lift or lower) Chair/bed transfer assistive device: Sliding board, Armrests Mechanical lift: Landscape architect     Max distance: 73ft Assist level: Maximal assist (Pt 25 - 49%)   Wheelchair   Type: Manual Max wheelchair distance: 50 Assist Level: Supervision or verbal cues  Cognition Comprehension Comprehension assist level: Understands basic 90% of the time/cues < 10% of the time  Expression Expression assist level: Expresses basic 50 - 74% of the time/requires cueing 25 - 49% of the time. Needs to repeat parts of sentences.  Social Interaction Social Interaction assist level: Interacts appropriately 75 - 89% of the time - Needs redirection for appropriate language or to initiate interaction.  Problem Solving Problem solving assist level: Solves basic 25 - 49% of the time - needs direction more than half the time to initiate, plan or complete simple activities  Memory Memory assist level: Recognizes or recalls 50 - 74% of the time/requires cueing 25 - 49% of the time    Medical Problem List and Plan: 1. Right hemiparesis and dysarthria/dysphagia secondary to left corona radiata,putamen,caudate infarct on 1/9  Cont CIR therapies 2. DVT Prophylaxis/Anticoagulation: Pharmaceutical: Lovenox  3. Pain Management: tylenol prn  4. Mood:   Has history of anxiety--appears controlled at present  Started prozac to help with mood and recovery.   LCSW to follow for evaluation and support.  5. Neuropsych: This patient is not fully capable of making decisions on her own behalf.  6. Skin/Wound Care: Routine pressure relief measures. Maintain adequate  nutritional and hydration status.  7. Fluids/Electrolytes/Nutrition: Monitor I/O. Supplements between meals.  8. HTN: fair control at present  Norvasc increased to 10mg  on 1/15  Hydralazine  increased to 25 on 1/18   Cont lisinopril and metoprolol bid.   -want to avoid overtreating 9. Dysphagia: Continue dysphagia 1, honey liquids. Offer fluids between meals to maintain adequate hydration.  10. Dyslipidemia: on lipitor  11. Prediabetes: Hgb A1c- 6.0.   Modified diet to HH/CM. RD to educate patient and family on appropriate diet.   CBGs Controlled 1/18 12. GERD: Managed On Protonix.  13. Chronic constipation: Uses softners as well as suppository daily.   Started senna S at supper followed by suppository in am.  14. Emerging right spastic hemiparesis:   continue baclofen prn for now.   Resting hand splint and R-AFO.   ROM with therapy.   continue stretching,positioning   15. Hypokalemia  K+ 4.5    kdur bid  Mg+ 2.1 16. Hypoalbuminemia  Supplement initiated 1/13 17. AKI  Cr. 0.97, BUN trending down today  IVF qHS started 1/16---continue while on honey liq  -recheck next week   18. Leukocytosis  WBCs 12.4    Afebrile  Re-check monday 19. Hypernatremia  Na+ 140 today  Cont to monitor    LOS (Days) 9 A FACE TO FACE EVALUATION WAS PERFORMED  SWARTZ,ZACHARY T 03/09/2016 8:18 AM

## 2016-03-09 NOTE — Progress Notes (Addendum)
Occupational Therapy Session Note  Patient Details  Name: Christina Bradshaw MRN: [id number] Date of Birth: 1924-10-03  Today's Date: 03/09/2016 OT Individual Time: [id number] OT Individual Time Calculation (min): 51 min    Short Term Goals: Week 1:  OT Short Term Goal 1 (Week 1): Pt will complete sit > stand with max assist to complete LB dressing/hygiene OT Short Term Goal 2 (Week 1): Pt will complete toilet transfers with max assist 3 out of 4 attempts OT Short Term Goal 3 (Week 1): Pt will don pants with max assist of one caregiver OT Short Term Goal 4 (Week 1): Pt will position RUE during self-care tasks with min cues to increase awareness of positioning and decrease injury to shoulder      Skilled Therapeutic Interventions/Progress Updates: Skilled OT session completed with focus on R UE NMR, activity tolerance, and sit<stand during ADL retraining. Pt was eating lunch at time of arrival, agreeable to finish at EOB for sitting balance/midline orientation. Supine<sit with Mod A, pt able to bring bilateral LEs off of bed by hooking L LE under R LE. OT facilitated R UE weightbearing during self feeding. 2 small posterior LOBs with pt able to self correct with min cues. Afterwards she reported wanting to dress at sink. Squat pivot completed from EOB<w/c with Max A and instruction on head/hip relationship and manual facilitation for weight shifting. Pt able to don shirt with supervision and extra time! 2 helpers assisted for LB dressing for safety, with therapist blocking right knee and cuing pt for correcting right leaning/forward flexed posture. Pt able to achieve full standing with Max A. R UE weight bearing facilitated at sink and protected during sit<stand transitions by therapist, pt provided max cues to attend to R UE with min carryover. Afterwards pt was returned to w/c, she was left with half lap tray and family present at time of departure.      Therapy Documentation Precautions:   Precautions Precautions: Fall Precaution Comments: dense Rt hemiplegia Restrictions Weight Bearing Restrictions: No General:   Vital Signs: Therapy Vitals Temp: 98.6 F (37 C) Temp Source: Oral Pulse Rate: 64 Resp: 18 BP: (!) 123/55 Patient Position (if appropriate): Sitting Oxygen Therapy SpO2: 96 % O2 Device: Not Delivered Pain: No c/o pain during session    ADL:      See Function Navigator for Current Functional Status.   Therapy/Group: Individual Therapy  Tamee Battin A Kassi Esteve 03/09/2016, 4:48 PM

## 2016-03-10 ENCOUNTER — Inpatient Hospital Stay (HOSPITAL_COMMUNITY): Payer: Medicare Other | Admitting: Occupational Therapy

## 2016-03-10 ENCOUNTER — Inpatient Hospital Stay (HOSPITAL_COMMUNITY): Payer: Medicare Other | Admitting: Physical Therapy

## 2016-03-10 ENCOUNTER — Inpatient Hospital Stay (HOSPITAL_COMMUNITY): Payer: Medicare Other

## 2016-03-10 ENCOUNTER — Inpatient Hospital Stay (HOSPITAL_COMMUNITY): Payer: Medicare Other | Admitting: Speech Pathology

## 2016-03-10 LAB — BASIC METABOLIC PANEL
ANION GAP: 7 (ref 5–15)
BUN: 27 mg/dL — ABNORMAL HIGH (ref 6–20)
CALCIUM: 9 mg/dL (ref 8.9–10.3)
CO2: 24 mmol/L (ref 22–32)
Chloride: 109 mmol/L (ref 101–111)
Creatinine, Ser: 1.04 mg/dL — ABNORMAL HIGH (ref 0.44–1.00)
GFR calc non Af Amer: 46 mL/min — ABNORMAL LOW (ref >60)
GFR, EST AFRICAN AMERICAN: 54 mL/min — AB (ref >60)
GLUCOSE: 99 mg/dL (ref 65–99)
Potassium: 4.8 mmol/L (ref 3.5–5.1)
Sodium: 140 mmol/L (ref 135–145)

## 2016-03-10 LAB — CBC
HCT: 46.8 % — ABNORMAL HIGH (ref 36.0–46.0)
Hemoglobin: 15.2 g/dL — ABNORMAL HIGH (ref 12.0–15.0)
MCH: 31 pg (ref 26.0–34.0)
MCHC: 32.5 g/dL (ref 30.0–36.0)
MCV: 95.3 fL (ref 78.0–100.0)
PLATELETS: 237 10*3/uL (ref 150–400)
RBC: 4.91 MIL/uL (ref 3.87–5.11)
RDW: 14.2 % (ref 11.5–15.5)
WBC: 13.7 10*3/uL — AB (ref 4.0–10.5)

## 2016-03-10 LAB — URINALYSIS, ROUTINE W REFLEX MICROSCOPIC
BILIRUBIN URINE: NEGATIVE
GLUCOSE, UA: NEGATIVE mg/dL
Hgb urine dipstick: NEGATIVE
KETONES UR: NEGATIVE mg/dL
NITRITE: NEGATIVE
PH: 7 (ref 5.0–8.0)
PROTEIN: NEGATIVE mg/dL
Specific Gravity, Urine: 1.016 (ref 1.005–1.030)

## 2016-03-10 MED ORDER — BACLOFEN 5 MG HALF TABLET
5.0000 mg | ORAL_TABLET | Freq: Three times a day (TID) | ORAL | Status: DC
  Administered 2016-03-10 (×3): 5 mg via ORAL
  Filled 2016-03-10 (×3): qty 1

## 2016-03-10 MED ORDER — CEPHALEXIN 250 MG PO CAPS
250.0000 mg | ORAL_CAPSULE | Freq: Three times a day (TID) | ORAL | Status: DC
  Administered 2016-03-10 – 2016-03-12 (×5): 250 mg via ORAL
  Filled 2016-03-10 (×5): qty 1

## 2016-03-10 NOTE — Progress Notes (Signed)
[facility] PHYSICAL MEDICINE & REHABILITATION     PROGRESS NOTE  Subjective/Complaints:  Pt laying in bed this AM.  She slept fairly overnight.    ROS: Denies nausea, vomiting, diarrhea, shortness of breath or chest pain  Objective: Vital Signs: Blood pressure (!) 161/66, pulse 63, temperature 97.9 F (36.6 C), temperature source Oral, resp. rate 18, height 5\' 7"  (1.702 m), weight 54.2 kg (119 lb 7.8 oz), SpO2 98 %. No results found.  Recent Labs  03/10/16 0526  WBC 13.7*  HGB 15.2*  HCT 46.8*  PLT 237    Recent Labs  03/09/16 0502 03/10/16 0526  NA 140 140  K 5.1 4.8  CL 112* 109  GLUCOSE 96 99  BUN 29* 27*  CREATININE 0.97 1.04*  CALCIUM 8.8* 9.0   CBG (last 3)  No results for input(s): GLUCAP in the last 72 hours.  Wt Readings from Last 3 Encounters:  03/05/16 54.2 kg (119 lb 7.8 oz)  02/27/16 66.5 kg (146 lb 9.7 oz)  10/25/14 67.6 kg (149 lb)    Physical Exam:  BP (!) 161/66   Pulse 63   Temp 97.9 F (36.6 C) (Oral)   Resp 18   Ht 5\' 7"  (1.702 m)   Wt 54.2 kg (119 lb 7.8 oz)   SpO2 98%   BMI 18.71 kg/m  Constitutional: She appears well-developed and well-nourished.  HENT: Normocephalic and atraumatic.  Eyes: EOMI. No discharge.  Cardiovascular: RRR. No JVD. Respiratory: clear. Unlabored. GI: Soft. Bowel sounds are normal.  Musculoskeletal: She exhibits no edema. No tenderness.  Neurological: She is alert.  Right facial weakness with dysarthria Able to follow basic commands Motor: RUE 0/5 prox to distal RLE: 2/5 HF, 2/5 KE, 0/5 ADF/DP.  MAS; 1+/4 elbow flexors, wrist extensors, knee flexors, ADF Sensation diminished to light touch RUE/RLE  Skin: Skin is warm and dry. Intact. Psychiatric: Flat affect.   Assessment/Plan: 1. Functional deficits secondary to left corona radiata,putamen,caudate infarct which require 3+ hours per day of interdisciplinary therapy in a comprehensive inpatient rehab setting. Physiatrist is providing close team  supervision and 24 hour management of active medical problems listed below. Physiatrist and rehab team continue to assess barriers to discharge/monitor patient progress toward functional and medical goals.  Function:  Bathing Bathing position   Position: Wheelchair/chair at sink  Bathing parts Body parts bathed by patient: Chest, Abdomen, Right arm, Front perineal area, Right upper leg, Left upper leg, Left lower leg Body parts bathed by helper: Left arm, Buttocks, Right lower leg, Left lower leg, Back  Bathing assist Assist Level:  (max assist)      Upper Body Dressing/Undressing Upper body dressing   What is the patient wearing?: Pull over shirt/dress     Pull over shirt/dress - Perfomed by patient: Put head through opening, Pull shirt over trunk, Thread/unthread left sleeve, Thread/unthread right sleeve Pull over shirt/dress - Perfomed by helper: Thread/unthread right sleeve        Upper body assist Assist Level: Supervision or verbal cues      Lower Body Dressing/Undressing Lower body dressing Lower body dressing/undressing activity did not occur: N/A What is the patient wearing?: Pants, Non-skid slipper socks     Pants- Performed by patient: Thread/unthread left pants leg Pants- Performed by helper: Thread/unthread right pants leg, Pull pants up/down   Non-skid slipper socks- Performed by helper: Don/doff right sock, Don/doff left sock       Shoes - Performed by helper: Don/doff right shoe, Don/doff left shoe, Fasten  right, Fasten left          Lower body assist Assist for lower body dressing: 2 Helpers      Toileting Toileting Toileting activity did not occur: No continent bowel/bladder event Toileting steps completed by patient: Adjust clothing after toileting, Adjust clothing prior to toileting Toileting steps completed by helper: Adjust clothing prior to toileting, Performs perineal hygiene, Adjust clothing after toileting Toileting Assistive Devices: Grab  bar or rail  Toileting assist Assist level: Touching or steadying assistance (Pt.75%)   Transfers Chair/bed transfer Chair/bed transfer activity did not occur: N/A Chair/bed transfer method: Squat pivot Chair/bed transfer assist level: Maximal assist (Pt 25 - 49%/lift and lower) Chair/bed transfer assistive device: Armrests Mechanical lift: Landscape architect     Max distance: 72ft Assist level: Maximal assist (Pt 25 - 49%)   Wheelchair   Type: Manual Max wheelchair distance: 50 Assist Level: Supervision or verbal cues  Cognition Comprehension Comprehension assist level: Understands basic 90% of the time/cues < 10% of the time  Expression Expression assist level: Expresses basic 50 - 74% of the time/requires cueing 25 - 49% of the time. Needs to repeat parts of sentences.  Social Interaction Social Interaction assist level: Interacts appropriately 75 - 89% of the time - Needs redirection for appropriate language or to initiate interaction.  Problem Solving Problem solving assist level: Solves basic 25 - 49% of the time - needs direction more than half the time to initiate, plan or complete simple activities  Memory Memory assist level: Recognizes or recalls 50 - 74% of the time/requires cueing 25 - 49% of the time    Medical Problem List and Plan: 1. Right hemiparesis and dysarthria/dysphagia secondary to left corona radiata,putamen,caudate infarct on 1/9  Cont CIR therapies 2. DVT Prophylaxis/Anticoagulation: Pharmaceutical: Lovenox  3. Pain Management: tylenol prn  4. Mood:   Has history of anxiety--appears controlled at present  Started prozac to help with mood and recovery.   LCSW to follow for evaluation and support.  5. Neuropsych: This patient is not fully capable of making decisions on her own behalf.  6. Skin/Wound Care: Routine pressure relief measures. Maintain adequate nutritional and hydration status.  7. Fluids/Electrolytes/Nutrition: Monitor I/O.  Supplements between meals.  8. HTN:  Norvasc increased to 10mg  on 1/15  Hydralazine  increased to 25 on 1/18   Cont lisinopril and metoprolol bid.   Elevated this AM, Cont to monitor  9. Dysphagia: Continue dysphagia 1, honey liquids. Offer fluids between meals to maintain adequate hydration.  10. Dyslipidemia: on lipitor  11. Prediabetes: Hgb A1c- 6.0.   Modified diet to HH/CM. RD to educate patient and family on appropriate diet.   CBGs Controlled 1/19 12. GERD: Managed On Protonix.  13. Chronic constipation: Uses softners as well as suppository daily.   Started senna S at supper followed by suppository in am.  14. Right spastic hemiparesis:   Baclofen 5 scheduled 1/22.   Resting hand splint and R-AFO.   ROM with therapy.   continue stretching,positioning   15. Hypokalemia  K+ 4.8 on 1/22    Mg+ 2.1 on 1/19  IVF with K+ 16. Hypoalbuminemia  Supplement initiated 1/13 17. CKD  Cr. 1.04 on 1/22,   IVF qHS started 1/16---continue while on honey liq 18. Leukocytosis  WBCs 13.7  Afebrile  Trending up, UA/Ucx ordered, CXR ordered 19. Hypernatremia  Na+ 140 1/22  Cont to monitor    LOS (Days) 10 A FACE TO FACE EVALUATION WAS PERFORMED  Christina Bradshaw 03/10/2016 8:26 AM

## 2016-03-10 NOTE — Progress Notes (Signed)
Occupational Therapy Weekly Progress Note  Patient Details  Name: Christina Bradshaw MRN: [id number] Date of Birth: December 17, 1924  Beginning of progress report period: 03/01/16 End of progress report period: 03/10/16  Patient has met 1 of 4 short term goals.    [name] has made slow but gradual progress during this report period. She has gone from requiring 2 helpers for sit<stand transitions during self care, to now completing with Max A. Pt does still require 2 helpers for safety with clothing mgt and pericare during LB dressing and bathing respectively. She requires Max-Total A for BADLs and functional transfers with use of Stedy.   Patient continues to demonstrate the following deficits: muscle weakness, decreased cardiorespiratoy endurance, abnormal tone, unbalanced muscle activation, ataxia and decreased coordination and decreased standing balance, decreased postural control and hemiplegia and therefore will continue to benefit from skilled OT intervention to enhance overall performance with BADL.  Patient progressing toward long term goals..  Continue plan of care.  OT Short Term Goals Week 2:  OT Short Term Goal 1 (Week 2): Pt will complete LB dressing with Max A (1 helper) OT Short Term Goal 2 (Week 2): Pt will complete bathing with Min A and AE PRN OT Short Term Goal 3 (Week 2): Pt will complete toilet transfers with Max A and LRAD  OT Short Term Goal 4 (Week 2): Pt will utilize R UE as stabilizer for grooming tasks with min cues      Therapy Documentation Precautions:  Precautions Precautions: Fall Precaution Comments: dense Rt hemiplegia Restrictions Weight Bearing Restrictions: No General:   Vital Signs: Therapy Vitals Pulse Rate: 60 BP: (!) 123/56 Pain:   ADL:      See Function Navigator for Current Functional Status.   Therapy/Group: Individual Therapy  Quincy Boy A Ilo Beamon 03/10/2016, 7:59 PM

## 2016-03-10 NOTE — Progress Notes (Signed)
Orthopedic Tech Progress Note Patient Details:  Denita LungMildred C Si [birth year] [id number]  Ortho Devices Type of Ortho Device: Sling immobilizer Ortho Device/Splint Interventions: Application   Saul FordyceJennifer C Aiyanah Kalama 03/10/2016, 4:12 PM

## 2016-03-10 NOTE — Progress Notes (Signed)
Physical Therapy Session Note  Patient Details  Name: Christina Bradshaw MRN: [id number] Date of Birth: [birth year]  Today's Date: 03/10/2016 PT Individual Time: 1400-1430 PT Individual Time Calculation (min): 30 min   Short Term Goals: Week 2:  PT Short Term Goal 1 (Week 2): Pt will be able to perform functional transfers with mod assist PT Short Term Goal 2 (Week 2): Pt will be able to progress sit <> stand with mod assist PT Short Term Goal 3 (Week 2): Pt will be able to gait x 25' with max assist  PT Short Term Goal 4 (Week 2): Pt will be able to propel w/c with hemi-technique x 100' with supervision  Skilled Therapeutic Interventions/Progress Updates:    Session focusing on transfers in parallel bars. Encouraging scooting in w/c technique with leaning and scooting. Progressed to sit<>stand with PT blocking Rt knee for stability with weight shifting to Rt. When standing working on forward lean to transition weight forward, needing verbal and manual cues/assist. Attempted standing Rt LE advancement with pt activating trace hip flexion initially but quickly fatiguing to total assist. Pt fatigued by end of session with decreasing stability through LLE noted. Pt in room with call light and son present, lap tray in place.   Therapy Documentation Precautions:  Precautions Precautions: Fall Precaution Comments: dense Rt hemiplegia Restrictions Weight Bearing Restrictions: No  Pain:  Denies pain.   See Function Navigator for Current Functional Status.   Therapy/Group: Individual Therapy  Delton SeeBenjamin Jaxston Chohan, PT, CSCS 03/10/2016, 3:34 PM

## 2016-03-10 NOTE — Progress Notes (Signed)
Physical Therapy Session Note  Patient Details  Name: Christina Bradshaw MRN: [id number] Date of Birth: 1924-09-19  Today's Date: 03/10/2016 PT Individual Time: 1300-1400 PT Individual Time Calculation (min): 60 min   Short Term Goals: Week 2:  PT Short Term Goal 1 (Week 2): Pt will be able to perform functional transfers with mod assist PT Short Term Goal 2 (Week 2): Pt will be able to progress sit <> stand with mod assist PT Short Term Goal 3 (Week 2): Pt will be able to gait x 25' with max assist  PT Short Term Goal 4 (Week 2): Pt will be able to propel w/c with hemi-technique x 100' with supervision  Skilled Therapeutic Interventions/Progress Updates: Pt presented in w/c agreeable to therapy. Pt stating aware had episode of incontinence. Use of [place], transfer to toilet, heavy modA sit to stand in [place]. Cues for maintaining midline as pt would lean to R. Pt able to maintain upright position to facilitate doffing of pants for toileting. Pt incontinent of bowl/bladder. Performed standing in [place] for peri-care. Pt returned to w/c in same manner. W/c propulsion 27500ft with mod cues for turning in narrow spaces. Performed SB transfer w/c to mat.  Pt requiring maxA for sequencing/timing, head/hip relationship.  Pt with some difficulty following directions at times performing opposite of what PTA requesting. Pt propelled back to room with handoff to PT for next session.     Therapy Documentation Precautions:  Precautions Precautions: Fall Precaution Comments: dense Rt hemiplegia Restrictions Weight Bearing Restrictions: No General:     See Function Navigator for Current Functional Status.   Therapy/Group: Individual Therapy  Denette Hass  Chares Slaymaker, PTA  03/10/2016, 3:50 PM

## 2016-03-10 NOTE — Progress Notes (Signed)
Speech Language Pathology Daily Session Note  Patient Details  Name: Christina LungMildred C Loney MRN: [id number] Date of Birth: 1924-08-28  Today's Date: 03/10/2016 SLP Individual Time: 0830-0900 SLP Individual Time Calculation (min): 30 min  Short Term Goals: Week 2: SLP Short Term Goal 1 (Week 2): Patient will consume current diet with minimal overt s/s of aspiration with Min A verbal cues for use of swallowing compensatory strategies.  SLP Short Term Goal 2 (Week 2): Patient will consume trials of nectar-thick liquids with minimal overt s/s of aspiration over 2 sessions prior to upgrade or repeat objective assessment.  SLP Short Term Goal 3 (Week 2): Patient will utilize speech intelligibility strategies at the phrase level with Min A multimodal cues.  SLP Short Term Goal 4 (Week 2): Patient will utilize word-finding strateiges during functional tasks/conversations with Min A verbal cues.  SLP Short Term Goal 5 (Week 2): Patient will demonstrate functional problem solving for basic and familiar tasks with Mod A multimodal cues.  SLP Short Term Goal 6 (Week 2): Patient will recall new, daily information with Mod A verbal cues.   Skilled Therapeutic Interventions: Skilled treatment session focused on dysphagia and speech goals. Upon arrival, patient was consuming her breakfast meal with NT present. Patient consumed Dys. 1 textures with honey-thick liquids without overt s/s of aspiration but required Min A verbal cues for use of swallowing compensatory strategies and to self-monitor left anterior spillage. Recommend patient continue current diet. Patient also participated in a functional conversation with 75% accuracy and Min A verbal cues for use of speech intelligibility strategies and sequenced 4 step picture cards with Mod A verbal cues for problem solving. Patient left upright in bed with all needs within reach. Continue with current plan of care.      Function:  Eating Eating   Modified  Consistency Diet: Yes Eating Assist Level: Supervision or verbal cues;Set up assist for   Eating Set Up Assist For: Opening containers       Cognition Comprehension Comprehension assist level: Understands basic 90% of the time/cues < 10% of the time  Expression   Expression assist level: Expresses basic 50 - 74% of the time/requires cueing 25 - 49% of the time. Needs to repeat parts of sentences.  Social Interaction Social Interaction assist level: Interacts appropriately 75 - 89% of the time - Needs redirection for appropriate language or to initiate interaction.  Problem Solving Problem solving assist level: Solves basic 25 - 49% of the time - needs direction more than half the time to initiate, plan or complete simple activities  Memory Memory assist level: Recognizes or recalls 50 - 74% of the time/requires cueing 25 - 49% of the time    Pain Pain Assessment Pain Assessment: 0-10 Pain Score: 0-No pain Pain Type: Acute pain Pain Location: Leg (top of thigh) Pain Orientation: Right Pain Descriptors / Indicators: Aching Pain Frequency: Occasional Pain Intervention(s): Medication (See eMAR)  Therapy/Group: Individual Therapy  Malloree Raboin 03/10/2016, 11:15 AM

## 2016-03-10 NOTE — Progress Notes (Signed)
Physical Therapy Session Note  Patient Details  Name: Christina LungMildred C Shankar MRN: [id number] Date of Birth: 1924/11/14  Today's Date: 03/10/2016 PT Individual Time: 1125-1155 PT Individual Time Calculation (min): 30 min   Short Term Goals: Week 2:  PT Short Term Goal 1 (Week 2): Pt will be able to perform functional transfers with mod assist PT Short Term Goal 2 (Week 2): Pt will be able to progress sit <> stand with mod assist PT Short Term Goal 3 (Week 2): Pt will be able to gait x 25' with max assist  PT Short Term Goal 4 (Week 2): Pt will be able to propel w/c with hemi-technique x 100' with supervision  Skilled Therapeutic Interventions/Progress Updates:    Session focused on neuro re-ed during bed mobility and slideboard transfers, and w/c mobility training. Pt required mod to max assist during functional slideboard transfers x 3 in session with focus on weightshifting, postural control, head/hips relationship, and reorientation to midline. Pt required mod to max verbal cues for carryover over. During w/c mobility, pt requires mod cues for technique, hand positioning on L wheel for hemi-technique, and for attention to R sided obstacles. End of session set up in w/c with all needs in reach.   Therapy Documentation Precautions:  Precautions Precautions: Fall Precaution Comments: dense Rt hemiplegia Restrictions Weight Bearing Restrictions: No   Pain: Reports leg soreness today. No intervention needed.     See Function Navigator for Current Functional Status.   Therapy/Group: Individual Therapy  Karolee StampsGray, Draedyn Weidinger [name]  Biviana Saddler B. Ido Wollman, PT, DPT  03/10/2016, 11:58 AM

## 2016-03-10 NOTE — Progress Notes (Signed)
Occupational Therapy Session Note  Patient Details  Name: Christina Bradshaw MRN: [id number] Date of Birth: 08-31-1924  Today's Date: 03/10/2016 OT Individual Time: [id number] OT Individual Time Calculation (min): 58 min    Short Term Goals: Week 1:  OT Short Term Goal 1 (Week 1): Pt will complete sit > stand with max assist to complete LB dressing/hygiene OT Short Term Goal 2 (Week 1): Pt will complete toilet transfers with max assist 3 out of 4 attempts OT Short Term Goal 3 (Week 1): Pt will don pants with max assist of one caregiver OT Short Term Goal 4 (Week 1): Pt will position RUE during self-care tasks with min cues to increase awareness of positioning and decrease injury to shoulder   Skilled Therapeutic Interventions/Progress Updates:    Upon entering the room, Pt's son present for observation. Pt with no c/o pain this session. Pt requesting to brush teeth. Pt standing at sink with max A and OT placing R UE into weight bearing position on sing. Pt needing min - mod A with balance in standing for her to remove teeth and comb hair. OT blocking R LE when standing.  Pt returning to seated position in wheelchair for complete grooming tasks. Pt verbalizing she is thirsty and needing min cues for swallowing strategies for honey thick liquids. Pt returned to bed at end of session with max A stand pivot transfer and mod A sit >supine for B LEs. OT answering questions for pt's son regarding therapeutic intervention and CVA process. Pt supine in bed with call bell and all needed items within reach upon exiting the room.   Therapy Documentation Precautions:  Precautions Precautions: Fall Precaution Comments: dense Rt hemiplegia Restrictions Weight Bearing Restrictions: No General:   Vital Signs: Therapy Vitals Temp: 97.6 F (36.4 C) Temp Source: Oral Pulse Rate: 63 Resp: 16 BP: (!) 110/49 Patient Position (if appropriate): Sitting Oxygen Therapy SpO2: 100 % O2 Device: Not  Delivered Pain:   ADL:   Exercises:   Other Treatments:    See Function Navigator for Current Functional Status.   Therapy/Group: Individual Therapy  [name], [name] 03/10/2016, 4:22 PM

## 2016-03-11 ENCOUNTER — Inpatient Hospital Stay (HOSPITAL_COMMUNITY): Payer: Medicare Other | Admitting: Physical Therapy

## 2016-03-11 ENCOUNTER — Inpatient Hospital Stay (HOSPITAL_COMMUNITY): Payer: Medicare Other | Admitting: Speech Pathology

## 2016-03-11 ENCOUNTER — Inpatient Hospital Stay (HOSPITAL_COMMUNITY): Payer: Medicare Other | Admitting: Occupational Therapy

## 2016-03-11 DIAGNOSIS — N390 Urinary tract infection, site not specified: Secondary | ICD-10-CM

## 2016-03-11 DIAGNOSIS — R9389 Abnormal findings on diagnostic imaging of other specified body structures: Secondary | ICD-10-CM | POA: Insufficient documentation

## 2016-03-11 DIAGNOSIS — R938 Abnormal findings on diagnostic imaging of other specified body structures: Secondary | ICD-10-CM

## 2016-03-11 MED ORDER — METHOCARBAMOL 500 MG PO TABS
500.0000 mg | ORAL_TABLET | Freq: Three times a day (TID) | ORAL | Status: DC | PRN
  Administered 2016-03-14 – 2016-03-22 (×6): 500 mg via ORAL
  Filled 2016-03-11 (×6): qty 1

## 2016-03-11 MED ORDER — MUSCLE RUB 10-15 % EX CREA
TOPICAL_CREAM | Freq: Three times a day (TID) | CUTANEOUS | Status: DC
  Administered 2016-03-11: 19:00:00 via TOPICAL
  Administered 2016-03-11: 1 via TOPICAL
  Administered 2016-03-11: 21:00:00 via TOPICAL
  Administered 2016-03-12: 1 via TOPICAL
  Administered 2016-03-12 – 2016-03-24 (×37): via TOPICAL
  Administered 2016-03-25: 1 via TOPICAL
  Administered 2016-03-25 – 2016-03-26 (×4): via TOPICAL
  Administered 2016-03-26: 1 via TOPICAL
  Filled 2016-03-11 (×2): qty 85

## 2016-03-11 MED ORDER — TROLAMINE SALICYLATE 10 % EX CREA
TOPICAL_CREAM | Freq: Three times a day (TID) | CUTANEOUS | Status: DC
  Filled 2016-03-11: qty 85

## 2016-03-11 MED ORDER — BACLOFEN 10 MG PO TABS
10.0000 mg | ORAL_TABLET | Freq: Three times a day (TID) | ORAL | Status: DC
  Administered 2016-03-11 – 2016-03-14 (×8): 10 mg via ORAL
  Filled 2016-03-11 (×8): qty 1

## 2016-03-11 MED ORDER — GABAPENTIN 100 MG PO CAPS: 100.0000 mg | ORAL_CAPSULE | Freq: Every day | ORAL | Status: DC

## 2016-03-11 MED ORDER — GABAPENTIN 100 MG PO CAPS
100.0000 mg | ORAL_CAPSULE | Freq: Every day | ORAL | Status: DC
  Administered 2016-03-12: 100 mg via ORAL
  Filled 2016-03-11: qty 1

## 2016-03-11 NOTE — Progress Notes (Signed)
Pt BP 99/52 at 21:05; on-call PA Harvel Ricksan Anguilli notified of abnormal BP; verbal order given to administer hydralazine and hold metoprolol.  Harlow AsaSochima O Kyria Bumgardner, RN 03/11/2016

## 2016-03-11 NOTE — Progress Notes (Signed)
Therapist reported discomfort to pt's R foot, and overall fatigue. Upon physical assessment of pt, R foot with visible footdrop, and mild edema to outer aspect of ankle. Slight discomfort with touch. Also noticed deep tissue injury to mid outer aspect of heel. Dawn, Wound Nurse available to assess and confirm deep tissue injury to R heel. Tylenol 650mg  given, and prafo boot applied. PA made aware, and order for muscle rub and Neurontin ordered as well..Marland Kitchen

## 2016-03-11 NOTE — Progress Notes (Signed)
Patient reporting burning/tingling pain right leg and right hand--discussed neuropathy. Will start low dose Neurontin at bedtime.

## 2016-03-11 NOTE — Progress Notes (Signed)
Physical Therapy Session Note  Patient Details  Name: Christina Bradshaw MRN: [id number] Date of Birth: February 12, 1925  Today's Date: 03/11/2016 PT Individual Time: 1400-1500 & 1100-1130 PT Individual Time Calculation (min): 60 min and 30 min  Short Term Goals: Week 2:  PT Short Term Goal 1 (Week 2): Pt will be able to perform functional transfers with mod assist PT Short Term Goal 2 (Week 2): Pt will be able to progress sit <> stand with mod assist PT Short Term Goal 3 (Week 2): Pt will be able to gait x 25' with max assist  PT Short Term Goal 4 (Week 2): Pt will be able to propel w/c with hemi-technique x 100' with supervision  Skilled Therapeutic Interventions/Progress Updates: Tx1 Pt presented in bed stating feeling a little better and agreeable to therapy. Performed supine to sit with modA and use of features. Pt refused transfer to w/c however agreeable to sitting EOB activities. Performed dynamic sitting balance, reach, ball toss. With pt demonstrating fair/fair + balance throughout. Performed active assisted knee flexion/extension of RLE with pt able to provide slight initiation of movement. Pt returned to bed mod/maxA with use of features. Pt left in bed with all current needs met.   Tx2 Pt presented in bed agreeable to therapy. Pt required changing of brief due to episode of incontinence. Performed rolling L modA, R supervision. Supine to sit ModA with features. Stand pivot transfer maxA to w/c. Performed standing activities at parallel bars with mod/max cues for equal wt shifting and PTA blocking R knee.  Pt able to perform standing tolerance bouts up to 1 min. Performed SB transfer w/c to mat with modA and  max cues for sequencing. Pt able to propel back to room with supervision with x 1 break due to fatigue. Pt left in w/c with half lap tray in place and son present with all current needs met.  Therapy Documentation Precautions:  Precautions Precautions: Fall Precaution Comments:  dense Rt hemiplegia Restrictions Weight Bearing Restrictions: No   See Function Navigator for Current Functional Status.   Therapy/Group: Individual Therapy  Madeleine Fenn  Griselda Bramblett, PTA  03/11/2016, 4:45 PM

## 2016-03-11 NOTE — Progress Notes (Signed)
[facility]     PROGRESS NOTE  Subjective/Complaints:  Pt seen laying in bed this AM.  She slept well overnight.  She states her braces were not placed.  She complains of left leg pain.   ROS: +Left leg pain.  Denies nausea, vomiting, diarrhea, shortness of breath or chest pain  Objective: Vital Signs: Blood pressure (!) 119/56, pulse 67, temperature 98 F (36.7 C), temperature source Oral, resp. rate 16, height 5\' 7"  (1.702 m), weight 54.2 kg (119 lb 7.8 oz), SpO2 99 %. Dg Chest 2 View  Result Date: 03/10/2016 CLINICAL DATA:  Elevated white blood cell count. Malaise since having a CVA 2 weeks ago. EXAM: CHEST  2 VIEW COMPARISON:  PA and lateral chest x-ray of February 26, 2016 FINDINGS: The lungs remain hyper inflated. There is a small amount of pleural fluid has collected in the posterior costophrenic gutter on the left. The heart is top-normal in size. The pulmonary vascularity is normal. The mediastinum is normal in width. There is calcification in the wall of the thoracic aorta. There is multilevel degenerative disc disease of the thoracic spine. IMPRESSION: Small left pleural effusion layering posteriorly. No alveolar pneumonia nor pulmonary edema. Thoracic aortic atherosclerosis. Electronically Signed   By: David  [place] M.D.   On: 03/10/2016 16:59    Recent Labs  03/10/16 0526  WBC 13.7*  HGB 15.2*  HCT 46.8*  PLT 237    Recent Labs  03/09/16 0502 03/10/16 0526  NA 140 140  K 5.1 4.8  CL 112* 109  GLUCOSE 96 99  BUN 29* 27*  CREATININE 0.97 1.04*  CALCIUM 8.8* 9.0   CBG (last 3)  No results for input(s): GLUCAP in the last 72 hours.  Wt Readings from Last 3 Encounters:  03/05/16 54.2 kg (119 lb 7.8 oz)  02/27/16 66.5 kg (146 lb 9.7 oz)  10/25/14 67.6 kg (149 lb)    Physical Exam:  BP (!) 119/56 (BP Location: Left Arm)   Pulse 67   Temp 98 F (36.7 C) (Oral)   Resp 16   Ht 5\' 7"  (1.702 m)   Wt 54.2 kg (119 lb 7.8 oz)   SpO2  99%   BMI 18.71 kg/m  Constitutional: She appears well-developed and well-nourished.  HENT: Normocephalic and atraumatic.  Eyes: EOMI. No discharge.  Cardiovascular: RRR. No JVD. Respiratory: Clear. Unlabored. GI: Soft. Bowel sounds are normal.  Musculoskeletal: She exhibits no edema. No tenderness.  Neurological: She is alert.  Right facial weakness with dysarthria Able to follow basic commands Motor: RUE 0/5 prox to distal (unchanged) RLE: 2-/5 HF, 2-/5 KE, 0/5 ADF/DP.  MAS; 1+/4 elbow flexors, wrist extensors, knee flexors, ADF Sensation diminished to light touch RUE/RLE  Skin: Skin is warm and dry. Intact. Psychiatric: Flat affect.   Assessment/Plan: 1. Functional deficits secondary to left corona radiata,putamen,caudate infarct which require 3+ hours per day of interdisciplinary therapy in a comprehensive inpatient rehab setting. Physiatrist is providing close team supervision and 24 hour management of active medical problems listed below. Physiatrist and rehab team continue to assess barriers to discharge/monitor patient progress toward functional and medical goals.  Function:  Bathing Bathing position   Position: Wheelchair/chair at sink  Bathing parts Body parts bathed by patient: Chest, Abdomen, Right arm, Front perineal area, Right upper leg, Left upper leg, Left lower leg Body parts bathed by helper: Left arm, Buttocks, Right lower leg, Left lower leg, Back  Bathing assist Assist Level:  (max assist)  Upper Body Dressing/Undressing Upper body dressing   What is the patient wearing?: Pull over shirt/dress     Pull over shirt/dress - Perfomed by patient: Put head through opening, Pull shirt over trunk, Thread/unthread left sleeve, Thread/unthread right sleeve Pull over shirt/dress - Perfomed by helper: Thread/unthread right sleeve        Upper body assist Assist Level: Supervision or verbal cues      Lower Body Dressing/Undressing Lower body dressing  Lower body dressing/undressing activity did not occur: N/A What is the patient wearing?: Pants, Non-skid slipper socks     Pants- Performed by patient: Thread/unthread left pants leg Pants- Performed by helper: Thread/unthread right pants leg, Pull pants up/down   Non-skid slipper socks- Performed by helper: Don/doff right sock, Don/doff left sock       Shoes - Performed by helper: Don/doff right shoe, Don/doff left shoe, Fasten right, Fasten left          Lower body assist Assist for lower body dressing: 2 Designer, multimedia activity did not occur: No continent bowel/bladder event Toileting steps completed by patient: Adjust clothing after toileting, Adjust clothing prior to toileting Toileting steps completed by helper: Adjust clothing prior to toileting, Performs perineal hygiene, Adjust clothing after toileting Toileting Assistive Devices: Grab bar or rail  Toileting assist Assist level: Touching or steadying assistance (Pt.75%)   Transfers Chair/bed transfer Chair/bed transfer activity did not occur: N/A Chair/bed transfer method: Stand pivot Chair/bed transfer assist level: Maximal assist (Pt 25 - 49%/lift and lower) Chair/bed transfer assistive device: Armrests Mechanical lift: Landscape architect     Max distance: 109ft Assist level: Maximal assist (Pt 25 - 49%)   Wheelchair   Type: Manual Max wheelchair distance: 100 ft Assist Level: Supervision or verbal cues  Cognition Comprehension Comprehension assist level: Understands basic 90% of the time/cues < 10% of the time  Expression Expression assist level: Expresses basic 90% of the time/requires cueing < 10% of the time.  Social Interaction Social Interaction assist level: Interacts appropriately 50 - 74% of the time - May be physically or verbally inappropriate.  Problem Solving Problem solving assist level: Solves basic 75 - 89% of the time/requires cueing 10 - 24% of the time   Memory Memory assist level: Recognizes or recalls 90% of the time/requires cueing < 10% of the time    Medical Problem List and Plan: 1. Right hemiparesis and dysarthria/dysphagia secondary to left corona radiata,putamen,caudate infarct on 1/9  Cont CIR therapies 2. DVT Prophylaxis/Anticoagulation: Pharmaceutical: Lovenox  3. Pain Management: tylenol prn   Robaxin 500 TID PRN added 1/23 for left leg pain, likely muscular 4. Mood:   Has history of anxiety--appears controlled at present  Started prozac to help with mood and recovery.   LCSW to follow for evaluation and support.  5. Neuropsych: This patient is not fully capable of making decisions on her own behalf.  6. Skin/Wound Care: Routine pressure relief measures. Maintain adequate nutritional and hydration status.  7. Fluids/Electrolytes/Nutrition: Monitor I/O. Supplements between meals.  8. HTN:  Norvasc increased to 10mg  on 1/15  Hydralazine  increased to 25 on 1/18   Cont lisinopril and metoprolol bid.   Controlled 1/23  9. Dysphagia: Continue dysphagia 1, honey liquids. Offer fluids between meals to maintain adequate hydration.  10. Dyslipidemia: on lipitor  11. Prediabetes: Hgb A1c- 6.0.   Modified diet to HH/CM. RD to educate patient and family on appropriate diet.   CBGs Controlled  1/23 12. GERD: Managed On Protonix.  13. Chronic constipation: Uses softners as well as suppository daily.   Started senna S at supper followed by suppository in am.  14. Right spastic hemiparesis:   Baclofen 5 scheduled 1/22, increased to 10mg  on 1/23.   Resting hand splint and R-AFO, discussed with nursing bracing at night.  ROM with therapy.   continue stretching,positioning   15. Hypokalemia  K+ 4.8 on 1/22    Mg+ 2.1 on 1/19  IVF with K+ 16. Hypoalbuminemia  Supplement initiated 1/13 17. CKD  Cr. 1.04 on 1/22,   IVF qHS started 1/16---continue while on honey liq 18. Leukocytosis  WBCs 13.7  Afebrile  CXR reviewed,  unremarkable for infectious process, small left pleural effusion 19. Hypernatremia  Na+ 140 1/22  Cont to monitor 20. Acute lower UTI  UA+, Ucx pending  Empiric keflex started 1/22    LOS (Days) 11 A FACE TO FACE EVALUATION WAS PERFORMED  Jasmyn Picha Karis Jubanil Dennie Vecchio 03/11/2016 8:08 AM

## 2016-03-11 NOTE — Progress Notes (Addendum)
Speech Language Pathology Daily Session Note  Patient Details  Name: Christina LungMildred C Rise MRN: [id number] Date of Birth: 07/05/1924  Today's Date: 03/11/2016 SLP Individual Time: 0833-0900 SLP Individual Time Calculation (min): 27 min  Short Term Goals: Week 2: SLP Short Term Goal 1 (Week 2): Patient will consume current diet with minimal overt s/s of aspiration with Min A verbal cues for use of swallowing compensatory strategies.  SLP Short Term Goal 2 (Week 2): Patient will consume trials of nectar-thick liquids with minimal overt s/s of aspiration over 2 sessions prior to upgrade or repeat objective assessment.  SLP Short Term Goal 3 (Week 2): Patient will utilize speech intelligibility strategies at the phrase level with Min A multimodal cues.  SLP Short Term Goal 4 (Week 2): Patient will utilize word-finding strateiges during functional tasks/conversations with Min A verbal cues.  SLP Short Term Goal 5 (Week 2): Patient will demonstrate functional problem solving for basic and familiar tasks with Mod A multimodal cues.  SLP Short Term Goal 6 (Week 2): Patient will recall new, daily information with Mod A verbal cues.   Skilled Therapeutic Interventions: Pt was seen for skilled ST targeting goals for dysphagia and communication.  Pt consumed therapeutic trials of nectar thick liquids via teaspoon and cup sips without overt s/s of aspiration with supervision verbal cues for use of swallowing precautions.  PO intake was limited as pt reported feeling "full" from breakfast; therefore, recommend ongoing trials of advanced consistencies with SLP to ensure consistent use of swallowing precautions and toleration.  SLP discussed compensatory strategies for speech intelligibility, emphasizing slow rate and increased vocal intensity, which pt was then able to use in a verbal picture description task with min-mod assist cues to achieve intelligibility in phrases.  Pt also benefited from min-mod assist verbal  cues to monitor and correct paraphasic errors during the abovementioned task.  Pt was left in bed with bed alarm set and call bell within reach.  Continue per current plan of care.       Function:  Eating Eating   Modified Consistency Diet: Yes Eating Assist Level: Supervision or verbal cues           Cognition Comprehension Comprehension assist level: Understands basic 90% of the time/cues < 10% of the time  Expression   Expression assist level: Expresses basic 50 - 74% of the time/requires cueing 25 - 49% of the time. Needs to repeat parts of sentences.  Social Interaction Social Interaction assist level: Interacts appropriately 75 - 89% of the time - Needs redirection for appropriate language or to initiate interaction.  Problem Solving Problem solving assist level: Solves basic 50 - 74% of the time/requires cueing 25 - 49% of the time  Memory Memory assist level: Recognizes or recalls 50 - 74% of the time/requires cueing 25 - 49% of the time    Pain 8/10 tingling pain in RLE; RN made aware   Therapy/Group: Individual Therapy  Clois Montavon, Melanee SpryNicole L 03/11/2016, 12:34 PM

## 2016-03-11 NOTE — Progress Notes (Signed)
Occupational Therapy Session Note  Patient Details  Name: Christina Bradshaw MRN: [id number] Date of Birth: 1924-07-13  Today's Date: 03/11/2016 OT Individual Time: 1000-1021 OT Individual Time Calculation (min): 21 min  and Today's Date: 03/11/2016 OT Missed Time: 54 Minutes Missed Time Reason: Pain;Patient fatigue   Short Term Goals: Week 2:  OT Short Term Goal 1 (Week 2): Pt will complete LB dressing with Max A (1 helper) OT Short Term Goal 2 (Week 2): Pt will complete bathing with Min A and AE PRN OT Short Term Goal 3 (Week 2): Pt will complete toilet transfers with Max A and LRAD  OT Short Term Goal 4 (Week 2): Pt will utilize R UE as stabilizer for grooming tasks with min cues   Skilled Therapeutic Interventions/Progress Updates:  Upon entering the room, pt supine in bed and very difficulty to awaken. Pt with c/o pain in R UE and LE this session. RN notified and medications given prior to arrival. Pt reports feeling unwell and that she did not sleep well last night. BP taken with results of 112/47 and HR 66 bpm. OT provided education for participation but pt declined. OT repositioned pt in bed and notified RN. Bed alarm activated and call bell within reach upon exiting the room.      Therapy Documentation Precautions:  Precautions Precautions: Fall Precaution Comments: dense Rt hemiplegia Restrictions Weight Bearing Restrictions: No General: General OT Amount of Missed Time: 54 Minutes Vital Signs: Therapy Vitals BP: (!) 129/50 Pain: Pain Assessment Pain Score: Asleep ADL:   Exercises:   Other Treatments:    See Function Navigator for Current Functional Status.   Therapy/Group: Individual Therapy  [name], Jalah Warmuth P 03/11/2016, 10:50 AM

## 2016-03-12 ENCOUNTER — Inpatient Hospital Stay (HOSPITAL_COMMUNITY): Payer: Medicare Other | Admitting: Occupational Therapy

## 2016-03-12 ENCOUNTER — Inpatient Hospital Stay (HOSPITAL_COMMUNITY): Payer: Medicare Other | Admitting: Physical Therapy

## 2016-03-12 ENCOUNTER — Inpatient Hospital Stay (HOSPITAL_COMMUNITY): Payer: Medicare Other | Admitting: Speech Pathology

## 2016-03-12 DIAGNOSIS — M792 Neuralgia and neuritis, unspecified: Secondary | ICD-10-CM

## 2016-03-12 LAB — URINE CULTURE

## 2016-03-12 MED ORDER — CEFIXIME 400 MG PO CAPS
400.0000 mg | ORAL_CAPSULE | Freq: Every day | ORAL | Status: DC
  Administered 2016-03-12: 400 mg via ORAL
  Filled 2016-03-12 (×2): qty 1

## 2016-03-12 NOTE — Progress Notes (Signed)
Social Work Patient ID: Christina Bradshaw, female   DOB: 21-May-1924, 83 y.o.   MRN: [id number]  Anselm Pancoast, LCSW Social Worker Signed   Patient Care Conference Date of Service: 03/12/2016  6:55 PM      Hide copied text Hover for attribution information Inpatient RehabilitationTeam Conference and Plan of Care Update Date: 03/12/2016   Time: 2:35 PM      Patient Name: Christina Bradshaw      Medical Record Number: [id number]  Date of Birth: 25-Sep-1924 Sex: Female         Room/Bed: 4M01C/4M01C-01 Payor Info: Payor: MEDICARE / Plan: MEDICARE PART A AND B / Product Type: *No Product type* /     Admitting Diagnosis: l cva  Admit Date/Time:  02/29/2016  3:34 PM Admission Comments: No comment available    Primary Diagnosis:  Left sided cerebral hemisphere cerebrovascular accident (CVA) (HCC) Principal Problem: Left sided cerebral hemisphere cerebrovascular accident (CVA) [facility])       Patient Active Problem List    Diagnosis Date Noted  . Neuropathic pain    . Acute lower UTI    . Abnormal chest x-ray    . Hypernatremia    . Leukocytosis    . AKI (acute kidney injury) (HCC)    . Labile blood pressure    . Prediabetes    . Hypokalemia    . Hypoalbuminemia due to protein-calorie malnutrition (HCC)    . Stage 3 chronic kidney disease    . Left sided cerebral hemisphere cerebrovascular accident (CVA) (HCC) 02/29/2016  . Spastic hemiparesis of right dominant side due to cerebral infarction (HCC) 02/29/2016  . Dysphagia 02/29/2016  . Essential hypertension 02/29/2016  . TIA (transient ischemic attack) 02/27/2016  . CVA (cerebral vascular accident) (HCC) 02/27/2016      Expected Discharge Date: Expected Discharge Date: 03/26/16   Team Members Present: Physician leading conference: Dr. Maryla Morrow Social Worker Present: Amada Jupiter, LCSW Nurse Present: Carmie End, RN PT Present: Katherine Mantle, PT;Other (comment) (Rosita Dechalus, PTA) OT Present: Callie Fielding, OT SLP Present:  Jackalyn Lombard, SLP PPS Coordinator present : Tora Duck, RN, CRRN       Current Status/Progress Goal Weekly Team Focus  Medical     Right hemiparesis and dysarthria/dysphagia secondary to left corona radiata,putamen,caudate infarct on 1/9  Improve mobility, safety, transfers, HTN, tone, UTI  See above   Bowel/Bladder     Incontinent of B/B. LBM 03/11/16  pt will decrease in number of incontinent events/accidents per shift  continue timed toileting Q2HR   Swallow/Nutrition/ Hydration     Improving mastication of dys 2 textures and improved s/s of airway protection with nectar thick liquids   supervision   continue working towards repeat objective study to determine readiness for advancement    ADL's     max A squat pivot, min- mod A UB, total A LB self care, incontient, R UE flaccid  min standing, min tub bench transfers, bathing, dressing, toileting  ADL retraining, functional mobility, balance, RUE NMR, R visual scanning.    Mobility     maxA squat pivot, modA SB transfer with max cues  MinA transfers and bed mobilty, gait 33ft LRAD  Bed mobility, Transfers, standing balance   Communication     min-mod assist   min assist-supevision   continue to address increased vocal intensity, overarticulation, slow rate, awareness and correction of verbal errors.    Safety/Cognition/ Behavioral Observations   mod assist   min assist   continue  to address basic cognition    Pain     Muscle rub to R foot PRN; Tylenol 650mg  PRN Q4HR  <3  Monitor for effectiveness of PRN medication(s)   Skin     Deep tissue injury to outer aspect of R heel; foam dressing to area  Pt will be free of additional skin breakdown  monitor Q shift for changes in pt skin integrity; PRAFO boot on during sleep     Rehab Goals Patient on target to meet rehab goals: Yes *See Care Plan and progress notes for long and short-term goals.   Barriers to Discharge: Mobiliy, safety, transfers, HTN, UTI, spasticity     Possible  Resolutions to Barriers:  Follow labs, IVF qHS, therapies, antispasticity meds, abx for UTI     Discharge Planning/Teaching Needs:  Pt to dc home with family to arrange 24/7 assistance -   Education to be planned   Team Discussion:  Nauseated today;  Adjusting BP meds and other meds.  Started abx for UTI (which could be cause of nausea).  Still with IVF qhs.  Still trying to be more vigilant about timed toileting.  Deep tissues injury to heel - PRAFO.  Currently mod assist - total with therapies but inconsistent.  Tx want to keep min assist goals for now to see how she recovers from UTI and nausea from abx.  Aim to begin family ed next week.  Revisions to Treatment Plan:  None at this time.    Continued Need for Acute Rehabilitation Level of Care: The patient requires daily medical management by a physician with specialized training in physical medicine and rehabilitation for the following conditions: Daily direction of a multidisciplinary physical rehabilitation program to ensure safe treatment while eliciting the highest outcome that is of practical value to the patient.: Yes Daily medical management of patient stability for increased activity during participation in an intensive rehabilitation regime.: Yes Daily analysis of laboratory values and/or radiology reports with any subsequent need for medication adjustment of medical intervention for : Neurological problems;Diabetes problems;Blood pressure problems;Renal problems;Urological problems   Brentlee Delage 03/12/2016, 6:55 PM      Anselm Pancoast, LCSW Social Worker Signed   Patient Care Conference Date of Service: 03/06/2016 12:04 PM      Hide copied text Hover for attribution information Inpatient RehabilitationTeam Conference and Plan of Care Update Date: 03/05/2016   Time: 2:45 PM      Patient Name: Christina Bradshaw      Medical Record Number: [id number]  Date of Birth: [birth year] Sex: Female         Room/Bed: 4M01C/4M01C-01 Payor  Info: Payor: MEDICARE / Plan: MEDICARE PART A AND B / Product Type: *No Product type* /     Admitting Diagnosis: l cva  Admit Date/Time:  02/29/2016  3:34 PM Admission Comments: No comment available    Primary Diagnosis:  Left sided cerebral hemisphere cerebrovascular accident (CVA) (HCC) Principal Problem: Left sided cerebral hemisphere cerebrovascular accident (CVA) [facility])       Patient Active Problem List    Diagnosis Date Noted  . Hypernatremia    . Leukocytosis    . AKI (acute kidney injury) (HCC)    . Labile blood pressure    . Prediabetes    . Hypokalemia    . Hypoalbuminemia due to protein-calorie malnutrition (HCC)    . Stage 3 chronic kidney disease    . Left sided cerebral hemisphere cerebrovascular accident (CVA) (HCC) 02/29/2016  . Spastic hemiparesis of right  dominant side due to cerebral infarction (HCC) 02/29/2016  . Dysphagia 02/29/2016  . Essential hypertension 02/29/2016  . TIA (transient ischemic attack) 02/27/2016  . CVA (cerebral vascular accident) (HCC) 02/27/2016      Expected Discharge Date: Expected Discharge Date: 03/26/16   Team Members Present: Physician leading conference: Dr. Maryla Morrow Social Worker Present: Amada Jupiter, LCSW Nurse Present: Kennon Portela, RN PT Present: Katherine Mantle, PT;Other (comment) (Rosita Dechalus, PTA) OT Present: Callie Fielding, OT SLP Present: Fae Pippin, SLP PPS Coordinator present : Edson Snowball, PT       Current Status/Progress Goal Weekly Team Focus  Medical     Right hemiparesis and dysarthria/dysphagia secondary to left corona radiata,putamen,caudate infarct on 1/9  Improve mobility, safety, transfers, comorbid conditions  See above   Bowel/Bladder     Incontnent of bowel and bladder. LBM 03/06/16  Managed bowel and bladder  Timed toileting q 2hrs   Swallow/Nutrition/ Hydration     Dys 1, honey thick liquids   supervision   toleration of current diet and trials of advanced textures   ADL's     mod A  bathing, min A donning shirt, total A pants, incontinent, max A sit to stand and squat pivot transfers, RUE flaccid  min standing, min tub bench transfers, bathing, dressing, toileting  ADL retraining, functional mobility, balance, RUE management and NMR, R visual scanning   Mobility     maxA transfers, maxA gait 5-49ft with dependent placement of RLE  MinA transfers, and gait 93ft. Stair management x 5 with 1 rail  Bed mobilty, squat pivot transfers, pre-gait activities, Balance    Communication     mod assist   min assist-supervision    education and carryover of compensatory strategies   Safety/Cognition/ Behavioral Observations   mod-max assist   min assist   basic cognition    Pain     No c/o pain   <3  Monitor for nonverbal cues of pain   Skin     CDI  CDI  Assess q shift, and encourage to turn q2hrs     Rehab Goals Patient on target to meet rehab goals: Yes *See Care Plan and progress notes for long and short-term goals.   Barriers to Discharge: Mobiliy, safety, transfers, HTN, prediabetes, hypokalemia, hypernatremia, AKI, leukocytosis     Possible Resolutions to Barriers:  Follow labs, IVF qHS, K+ supplementation, therapies     Discharge Planning/Teaching Needs:  Pt to dc home with family to arrange 24/7 assistance -   TBD   Team Discussion:  BP meds being adjusted; IVF at night and watching labs closely. incont b/b and recommeding timed toiletin.  Pt with NO AWRENESS of b/b issues.  theapies all feel that min assist goals overall may be lofty.  Currently mod - total assistance.  Diet has been downgraded as well.  SW to follow up with family but may need SNF.  Revisions to Treatment Plan:  None but tx goals may be downgraded    Continued Need for Acute Rehabilitation Level of Care: The patient requires daily medical management by a physician with specialized training in physical medicine and rehabilitation for the following conditions: Daily direction of a multidisciplinary  physical rehabilitation program to ensure safe treatment while eliciting the highest outcome that is of practical value to the patient.: Yes Daily medical management of patient stability for increased activity during participation in an intensive rehabilitation regime.: Yes Daily analysis of laboratory values and/or radiology reports with any subsequent need for  medication adjustment of medical intervention for : Neurological problems;Diabetes problems;Blood pressure problems;Renal problems   Augustine Leverette 03/07/2016, 9:21 AM

## 2016-03-12 NOTE — Progress Notes (Signed)
[facility] PHYSICAL MEDICINE & REHABILITATION     PROGRESS NOTE  Subjective/Complaints:  Pt seen laying in bed this AM.  She states she has no more leg pain and that she slept better.  She has her brace in place.   ROS: Denies nausea, vomiting, diarrhea, shortness of breath or chest pain.  Objective: Vital Signs: Blood pressure 138/62, pulse 74, temperature 98.7 F (37.1 C), temperature source Oral, resp. rate 18, height 5\' 7"  (1.702 m), weight 54.2 kg (119 lb 7.8 oz), SpO2 99 %. Dg Chest 2 View  Result Date: 03/10/2016 CLINICAL DATA:  Elevated white blood cell count. Malaise since having a CVA 2 weeks ago. EXAM: CHEST  2 VIEW COMPARISON:  PA and lateral chest x-ray of February 26, 2016 FINDINGS: The lungs remain hyper inflated. There is a small amount of pleural fluid has collected in the posterior costophrenic gutter on the left. The heart is top-normal in size. The pulmonary vascularity is normal. The mediastinum is normal in width. There is calcification in the wall of the thoracic aorta. There is multilevel degenerative disc disease of the thoracic spine. IMPRESSION: Small left pleural effusion layering posteriorly. No alveolar pneumonia nor pulmonary edema. Thoracic aortic atherosclerosis. Electronically Signed   By: David  [place] M.D.   On: 03/10/2016 16:59    Recent Labs  03/10/16 0526  WBC 13.7*  HGB 15.2*  HCT 46.8*  PLT 237    Recent Labs  03/10/16 0526  NA 140  K 4.8  CL 109  GLUCOSE 99  BUN 27*  CREATININE 1.04*  CALCIUM 9.0   CBG (last 3)  No results for input(s): GLUCAP in the last 72 hours.  Wt Readings from Last 3 Encounters:  03/05/16 54.2 kg (119 lb 7.8 oz)  02/27/16 66.5 kg (146 lb 9.7 oz)  10/25/14 67.6 kg (149 lb)    Physical Exam:  BP 138/62 (BP Location: Left Arm)   Pulse 74   Temp 98.7 F (37.1 C) (Oral)   Resp 18   Ht 5\' 7"  (1.702 m)   Wt 54.2 kg (119 lb 7.8 oz)   SpO2 99%   BMI 18.71 kg/m  Constitutional: She appears well-developed and  well-nourished.  HENT: Normocephalic and atraumatic.  Eyes: EOMI. No discharge.  Cardiovascular: RRR. No JVD. Respiratory: Clear. Unlabored. GI: Soft. Bowel sounds are normal.  Musculoskeletal: She exhibits no edema. No tenderness.  Neurological: She is alert.  Right facial weakness with dysarthria Able to follow basic commands Motor: RUE 0/5 prox to distal (stable) RLE: 2-/5 HF, 2-/5 KE, 0/5 ADF/DP.  MAS; 1+/4 elbow flexors, wrist extensors, knee flexors, ADF (unchanged) Sensation diminished to light touch RUE/RLE  Skin: Skin is warm and dry. Intact. Psychiatric: Flat affect.   Assessment/Plan: 1. Functional deficits secondary to left corona radiata,putamen,caudate infarct which require 3+ hours per day of interdisciplinary therapy in a comprehensive inpatient rehab setting. Physiatrist is providing close team supervision and 24 hour management of active medical problems listed below. Physiatrist and rehab team continue to assess barriers to discharge/monitor patient progress toward functional and medical goals.  Function:  Bathing Bathing position   Position: Wheelchair/chair at sink  Bathing parts Body parts bathed by patient: Chest, Abdomen, Right arm, Front perineal area, Right upper leg, Left upper leg, Left lower leg Body parts bathed by helper: Left arm, Buttocks, Right lower leg, Left lower leg, Back  Bathing assist Assist Level:  (max assist)      Upper Body Dressing/Undressing Upper body dressing   What  is the patient wearing?: Pull over shirt/dress     Pull over shirt/dress - Perfomed by patient: Put head through opening, Pull shirt over trunk, Thread/unthread left sleeve, Thread/unthread right sleeve Pull over shirt/dress - Perfomed by helper: Thread/unthread right sleeve        Upper body assist Assist Level: Supervision or verbal cues      Lower Body Dressing/Undressing Lower body dressing Lower body dressing/undressing activity did not occur: N/A What is  the patient wearing?: Pants, Non-skid slipper socks     Pants- Performed by patient: Thread/unthread left pants leg Pants- Performed by helper: Thread/unthread right pants leg, Pull pants up/down   Non-skid slipper socks- Performed by helper: Don/doff right sock, Don/doff left sock       Shoes - Performed by helper: Don/doff right shoe, Don/doff left shoe, Fasten right, Fasten left          Lower body assist Assist for lower body dressing: 2 Designer, multimedia activity did not occur: No continent bowel/bladder event Toileting steps completed by patient: Adjust clothing after toileting, Adjust clothing prior to toileting Toileting steps completed by helper: Adjust clothing prior to toileting, Performs perineal hygiene, Adjust clothing after toileting Toileting Assistive Devices: Grab bar or rail  Toileting assist Assist level: Touching or steadying assistance (Pt.75%)   Transfers Chair/bed transfer Chair/bed transfer activity did not occur: N/A Chair/bed transfer method: Stand pivot Chair/bed transfer assist level: Maximal assist (Pt 25 - 49%/lift and lower) Chair/bed transfer assistive device: Armrests Mechanical lift: Landscape architect     Max distance: 71ft Assist level: Maximal assist (Pt 25 - 49%)   Wheelchair   Type: Manual Max wheelchair distance: 100 ft Assist Level: Supervision or verbal cues  Cognition Comprehension Comprehension assist level: Understands basic 90% of the time/cues < 10% of the time  Expression Expression assist level: Expresses basic 50 - 74% of the time/requires cueing 25 - 49% of the time. Needs to repeat parts of sentences.  Social Interaction Social Interaction assist level: Interacts appropriately 75 - 89% of the time - Needs redirection for appropriate language or to initiate interaction.  Problem Solving Problem solving assist level: Solves basic 50 - 74% of the time/requires cueing 25 - 49% of the  time  Memory Memory assist level: Recognizes or recalls 50 - 74% of the time/requires cueing 25 - 49% of the time    Medical Problem List and Plan: 1. Right hemiparesis and dysarthria/dysphagia secondary to left corona radiata,putamen,caudate infarct on 1/9  Cont CIR therapies 2. DVT Prophylaxis/Anticoagulation: Pharmaceutical: Lovenox  3. Pain Management: tylenol prn   Robaxin 500 TID PRN added 1/23 for left leg pain, likely muscular  Neurontin started 1/23  4. Mood:   Has history of anxiety--appears controlled at present  Started prozac to help with mood and recovery.   LCSW to follow for evaluation and support.  5. Neuropsych: This patient is not fully capable of making decisions on her own behalf.  6. Skin/Wound Care: Routine pressure relief measures. Maintain adequate nutritional and hydration status.  7. Fluids/Electrolytes/Nutrition: Monitor I/O. Supplements between meals.  8. HTN:  Norvasc increased to 10mg  on 1/15  Hydralazine  increased to 25 on 1/18   Cont lisinopril and metoprolol bid.   Controlled 1/24  9. Dysphagia: Continue dysphagia 1, honey liquids. Offer fluids between meals to maintain adequate hydration.  10. Dyslipidemia: on lipitor  11. Prediabetes: Hgb A1c- 6.0.   Modified diet to HH/CM. RD to  educate patient and family on appropriate diet.   CBGs Controlled 1/24 12. GERD: Managed On Protonix.  13. Chronic constipation: Uses softners as well as suppository daily.   Started senna S at supper followed by suppository in am.  14. Right spastic hemiparesis:   Baclofen 5 scheduled 1/22, increased to 10mg  on 1/23.   Resting hand splint and R-AFO, discussed with nursing bracing at night.  ROM with therapy.   continue stretching,positioning   15. Hypokalemia  K+ 4.8 on 1/22    Mg+ 2.1 on 1/19  IVF with K+ 16. Hypoalbuminemia  Supplement initiated 1/13 17. CKD  Cr. 1.04 on 1/22,   IVF qHS started 1/16---continue while on honey liq 18. Leukocytosis- from  UTI  WBCs 13.7  Afebrile  CXR reviewed, unremarkable for infectious process, small left pleural effusion 19. Hypernatremia: Resolved  Na+ 140 1/22  Cont to monitor 20. Acute lower UTI  UA+, Ucx >Morganella and [place], sensitivities pending  Empiric keflex started 1/22    LOS (Days) 12 A FACE TO FACE EVALUATION WAS PERFORMED  Jalicia Roszak Karis Jubanil Skila Rollins 03/12/2016 8:13 AM

## 2016-03-12 NOTE — Plan of Care (Signed)
Problem: RH SKIN INTEGRITY Goal: RH STG SKIN FREE OF INFECTION/BREAKDOWN Skin to remain free from infection and breakdown with min assist while on rehab  Outcome: Not Progressing Pt developed pressure ulcer to R heel

## 2016-03-12 NOTE — Progress Notes (Signed)
Physical Therapy Note  Patient Details  Name: Denita LungMildred C Popescu MRN: [id number] Date of Birth: 1924-03-18 Today's Date: 03/12/2016    Pt presented in bed refusal to participate in therapy this afternoon. Pt stating too fatigued and continues to not feel well.Pt missed 30 min skilled therapy    Zaela Graley 03/12/2016, 3:28 PM

## 2016-03-12 NOTE — Patient Care Conference (Signed)
Inpatient RehabilitationTeam Conference and Plan of Care Update Date: 03/12/2016   Time: 2:35 PM    Patient Name: Christina Bradshaw      Medical Record Number: [id number]  Date of Birth: [birth year] Sex: Female         Room/Bed: 4M01C/4M01C-01 Payor Info: Payor: MEDICARE / Plan: MEDICARE PART A AND B / Product Type: *No Product type* /    Admitting Diagnosis: l cva  Admit Date/Time:  02/29/2016  3:34 PM Admission Comments: No comment available   Primary Diagnosis:  Left sided cerebral hemisphere cerebrovascular accident (CVA) (HCC) Principal Problem: Left sided cerebral hemisphere cerebrovascular accident (CVA) [facility](HCC)  Patient Active Problem List   Diagnosis Date Noted  . Neuropathic pain   . Acute lower UTI   . Abnormal chest x-ray   . Hypernatremia   . Leukocytosis   . AKI (acute kidney injury) (HCC)   . Labile blood pressure   . Prediabetes   . Hypokalemia   . Hypoalbuminemia due to protein-calorie malnutrition (HCC)   . Stage 3 chronic kidney disease   . Left sided cerebral hemisphere cerebrovascular accident (CVA) (HCC) 02/29/2016  . Spastic hemiparesis of right dominant side due to cerebral infarction (HCC) 02/29/2016  . Dysphagia 02/29/2016  . Essential hypertension 02/29/2016  . TIA (transient ischemic attack) 02/27/2016  . CVA (cerebral vascular accident) (HCC) 02/27/2016    Expected Discharge Date: Expected Discharge Date: 03/26/16  Team Members Present: Physician leading conference: Dr. Maryla MorrowAnkit Patel Social Worker Present: Christina JupiterLucy Robbie Rideaux, LCSW Nurse Present: Christina EndAngie Joyce, RN PT Present: Christina Bradshaw, PT;Other (comment) (Christina Bradshaw, PTA) OT Present: Christina Bradshaw, OT SLP Present: Christina Bradshaw, SLP PPS Coordinator present : Christina DuckMarie Noel, RN, CRRN     Current Status/Progress Goal Weekly Team Focus  Medical   Right hemiparesis and dysarthria/dysphagia secondary to left corona radiata,putamen,caudate infarct on 1/9  Improve mobility, safety, transfers, HTN, tone,  UTI  See above   Bowel/Bladder   Incontinent of B/B. LBM 03/11/16  pt will decrease in number of incontinent events/accidents per shift  continue timed toileting Q2HR   Swallow/Nutrition/ Hydration   Improving mastication of dys 2 textures and improved s/s of airway protection with nectar thick liquids   supervision   continue working towards repeat objective study to determine readiness for advancement    ADL's   max A squat pivot, min- mod A UB, total A LB self care, incontient, R UE flaccid  min standing, min tub bench transfers, bathing, dressing, toileting  ADL retraining, functional mobility, balance, RUE NMR, R visual scanning.    Mobility   maxA squat pivot, modA SB transfer with max cues  MinA transfers and bed mobilty, gait 5750ft LRAD  Bed mobility, Transfers, standing balance   Communication   min-mod assist   min assist-supevision   continue to address increased vocal intensity, overarticulation, slow rate, awareness and correction of verbal errors.    Safety/Cognition/ Behavioral Observations  mod assist   min assist   continue to address basic cognition    Pain   Muscle rub to R foot PRN; Tylenol 650mg  PRN Q4HR  <3  Monitor for effectiveness of PRN medication(s)   Skin   Deep tissue injury to outer aspect of R heel; foam dressing to area  Pt will be free of additional skin breakdown  monitor Q shift for changes in pt skin integrity; PRAFO boot on during sleep    Rehab Goals Patient on target to meet rehab goals: Yes *See Care Plan and progress  notes for long and short-term goals.  Barriers to Discharge: Mobiliy, safety, transfers, HTN, UTI, spasticity    Possible Resolutions to Barriers:  Follow labs, IVF qHS, therapies, antispasticity meds, abx for UTI    Discharge Planning/Teaching Needs:  Pt to dc home with family to arrange 24/7 assistance -   Education to be planned   Team Discussion:  Nauseated today;  Adjusting BP meds and other meds.  Started abx for UTI  (which could be cause of nausea).  Still with IVF qhs.  Still trying to be more vigilant about timed toileting.  Deep tissues injury to heel - PRAFO.  Currently mod assist - total with therapies but inconsistent.  Tx want to keep min assist goals for now to see how she recovers from UTI and nausea from abx.  Aim to begin family ed next week.  Revisions to Treatment Plan:  None at this time.   Continued Need for Acute Rehabilitation Level of Care: The patient requires daily medical management by a physician with specialized training in physical medicine and rehabilitation for the following conditions: Daily direction of a multidisciplinary physical rehabilitation program to ensure safe treatment while eliciting the highest outcome that is of practical value to the patient.: Yes Daily medical management of patient stability for increased activity during participation in an intensive rehabilitation regime.: Yes Daily analysis of laboratory values and/or radiology reports with any subsequent need for medication adjustment of medical intervention for : Neurological problems;Diabetes problems;Blood pressure problems;Renal problems;Urological problems  Atalya Dano 03/12/2016, 6:55 PM

## 2016-03-12 NOTE — Progress Notes (Signed)
Speech Language Pathology Daily Session Note  Patient Details  Name: Christina LungMildred C Wilbourn MRN: [id number] Date of Birth: [birth year]  Today's Date: 03/12/2016 SLP Individual Time: 1335-1400 SLP Individual Time Calculation (min): 25 min  Short Term Goals: Week 2: SLP Short Term Goal 1 (Week 2): Patient will consume current diet with minimal overt s/s of aspiration with Min A verbal cues for use of swallowing compensatory strategies.  SLP Short Term Goal 2 (Week 2): Patient will consume trials of nectar-thick liquids with minimal overt s/s of aspiration over 2 sessions prior to upgrade or repeat objective assessment.  SLP Short Term Goal 3 (Week 2): Patient will utilize speech intelligibility strategies at the phrase level with Min A multimodal cues.  SLP Short Term Goal 4 (Week 2): Patient will utilize word-finding strateiges during functional tasks/conversations with Min A verbal cues.  SLP Short Term Goal 5 (Week 2): Patient will demonstrate functional problem solving for basic and familiar tasks with Mod A multimodal cues.  SLP Short Term Goal 6 (Week 2): Patient will recall new, daily information with Mod A verbal cues.   Skilled Therapeutic Interventions:  Pt was seen for skilled ST targeting dysphagia goals.  Pt more alert this afternoon in comparison to morning therapy session and agreeable to participating in therapies at bedside.  Pt also agreeable to consuming small amounts of PO.  Therapist facilitated the session with trials of nectar thick liquids via teaspoon and cup to continue working towards diet progression.  No overt s/s of aspiration were evident with thickened liquids or purees, even when pt was allowed to feed herself at an increased rate.  PO intake remains limited.  Recommend that pt be advanced to intermittent supervision with honey thick liquids to work towards adequate hydration.  Full supervision still indicated during meals due to cognition and fluctuation in fatigue and  alertness.  Pt was left in bed with bed alarm set and call bell within reach.       Function:  Eating Eating     Eating Assist Level: Supervision or verbal cues           Cognition Comprehension Comprehension assist level: Understands basic 90% of the time/cues < 10% of the time  Expression   Expression assist level: Expresses basic 25 - 49% of the time/requires cueing 50 - 75% of the time. Uses single words/gestures.  Social Interaction Social Interaction assist level: Interacts appropriately 75 - 89% of the time - Needs redirection for appropriate language or to initiate interaction.  Problem Solving Problem solving assist level: Solves basic 25 - 49% of the time - needs direction more than half the time to initiate, plan or complete simple activities  Memory Memory assist level: Recognizes or recalls 25 - 49% of the time/requires cueing 50 - 75% of the time    Pain Pain Assessment Pain Assessment: No/denies pain  Therapy/Group: Individual Therapy  Damesha Lawler, Melanee SpryNicole L 03/12/2016, 3:59 PM

## 2016-03-12 NOTE — Progress Notes (Signed)
SLP Cancellation Note  Patient Details Name: Christina Bradshaw MRN: [id number] DOB: March 17, 1924   Cancelled treatment:        Attempted to see pt for therapy this AM; however, pt nauseated and fatigued from administration of nausea meds.  Pt only able to maintain alertness for brief periods before falling back to sleep.  Pt politely requested to rest instead of participating in therapies.  As a result, pt missed 60 min of skilled ST.  Therapist will continue efforts as appropriate.                                                                                                  Elzie Sheets, Melanee SpryNicole L 03/12/2016, 3:52 PM

## 2016-03-12 NOTE — Progress Notes (Signed)
Physical Therapy Session Note  Patient Details  Name: Christina Bradshaw MRN: [id number] Date of Birth: 11-07-1924  Today's Date: 03/12/2016 PT Individual Time: 1020-1057 PT Individual Time Calculation (min): 37 min    Skilled Therapeutic Interventions/Progress Updates: Session initiated in room with pt lying in bed.  Pt reports not feeling well but with encouragement is willing to work with therapist.  Began working on rolling in b/l directions.  Pt requires mod to max assist to roll to the left.  Pt additionally transferred with sliding board to wheelchair with max A x 1.  Propelled wheelchair with max A x approx 50 ft with max A.  Upon re-entering room, pt reports nausea.  Nursing notified and pt was given meds.  Nursing also notified of pt reporting 5/10 pain in R LE.  Pt returned to bed with sliding board transfer with max assist.  Worked on rolling again.  Pt left in bed with bed alarm set and 3 rails up.  Missed minutes due to pt not feeling well and significant lethargy.     Therapy Documentation Precautions:  Precautions Precautions: Fall Precaution Comments: dense Rt hemiplegia Restrictions Weight Bearing Restrictions: No     See Function Navigator for Current Functional Status.   Therapy/Group: Individual Therapy  Christina Bradshaw 03/12/2016, 11:23 AM

## 2016-03-12 NOTE — Progress Notes (Signed)
Occupational Therapy Session Note  Patient Details  Name: Christina LungMildred C Allerton MRN: [id number] Date of Birth: 06-30-1924  Today's Date: 03/12/2016 OT Individual Time: [id number] and 1300-1330 OT Individual Time Calculation (min): 73 min and 30 min    Short Term Goals: Week 2:  OT Short Term Goal 1 (Week 2): Pt will complete LB dressing with Max A (1 helper) OT Short Term Goal 2 (Week 2): Pt will complete bathing with Min A and AE PRN OT Short Term Goal 3 (Week 2): Pt will complete toilet transfers with Max A and LRAD  OT Short Term Goal 4 (Week 2): Pt will utilize R UE as stabilizer for grooming tasks with min cues   Skilled Therapeutic Interventions/Progress Updates:     Session 1: Upon entering the room, pt supine in bed with no c/o pain. Stand pivot transfer from bed to wheelchair to the L with max A for lifting and lowering. Pt requesting to shower this session. Stand pivot transfer into shower with total A to L and max A returning to wheelchair. Pt requiring steady assist for balance while seated on TTB for bathing at shower level. Pt needing hand over hand assistance to utilize R UE in functional task. Once out of shower, pt with c/o nausea and transferred total A back to bed with RN notified. Vitals assessed and pt WNL. Pt remained in bed and required min A to don hospital gown. Call bell and all needed items within reach upon exiting the room.   Session 2: Upon entering the room, pt supine in bed and very lethargic this session. Pt with no c/o pain. Pt initially refusing OT intervention but with coaxing she was agreeable. Pt declined toileting, drink, and lunch tray. Pt dressed from bed level this session with total A for LB dressing and mod A for rolling L <> R. Pt performed required max A for donning shirt with hemiplegic technique. Pt needing max cues for initiation, sequencing and attention to task this session. Pt remain supine in bed at end of session with call bell and all needed items  within reach.   Therapy Documentation Precautions:  Precautions Precautions: Fall Precaution Comments: dense Rt hemiplegia Restrictions Weight Bearing Restrictions: No General: General PT Missed Treatment Reason: Patient fatigue;Patient unwilling to participate Vital Signs: Therapy Vitals Temp: 97.5 F (36.4 C) Temp Source: Oral Pulse Rate: 64 Resp: 18 BP: (!) 150/52 Patient Position (if appropriate): Lying Oxygen Therapy SpO2: 98 % O2 Device: Not Delivered Pain: Pain Assessment Pain Assessment: No/denies pain ADL:   Exercises:   Other Treatments:    See Function Navigator for Current Functional Status.   Therapy/Group: Individual Therapy  [name], Haizel Gatchell P 03/12/2016, 4:07 PM

## 2016-03-13 ENCOUNTER — Inpatient Hospital Stay (HOSPITAL_COMMUNITY): Payer: Medicare Other | Admitting: Speech Pathology

## 2016-03-13 ENCOUNTER — Inpatient Hospital Stay (HOSPITAL_COMMUNITY): Payer: Medicare Other | Admitting: Occupational Therapy

## 2016-03-13 ENCOUNTER — Inpatient Hospital Stay (HOSPITAL_COMMUNITY): Payer: Medicare Other | Admitting: Physical Therapy

## 2016-03-13 MED ORDER — SULFAMETHOXAZOLE-TRIMETHOPRIM 400-80 MG PO TABS
1.0000 | ORAL_TABLET | Freq: Two times a day (BID) | ORAL | Status: DC
  Filled 2016-03-13: qty 1

## 2016-03-13 MED ORDER — SULFAMETHOXAZOLE-TRIMETHOPRIM 400-80 MG PO TABS
1.0000 | ORAL_TABLET | Freq: Two times a day (BID) | ORAL | Status: DC
  Administered 2016-03-13 – 2016-03-21 (×17): 1 via ORAL
  Filled 2016-03-13 (×17): qty 1

## 2016-03-13 NOTE — Progress Notes (Signed)
[facility] PHYSICAL MEDICINE & REHABILITATION     PROGRESS NOTE  Subjective/Complaints:  Pt laying in bed this AM.  She states she slept well overnight, but has pain in her RLE this AM.  Per nursing, pt had braces on overnight.    ROS: +Right leg pain. Denies nausea, vomiting, diarrhea, shortness of breath or chest pain.  Objective: Vital Signs: Blood pressure (!) 162/62, pulse 61, temperature 97.6 F (36.4 C), temperature source Oral, resp. rate 18, height 5\' 7"  (1.702 m), weight 59.1 kg (130 lb 4.7 oz), SpO2 98 %. No results found. No results for input(s): WBC, HGB, HCT, PLT in the last 72 hours. No results for input(s): NA, K, CL, GLUCOSE, BUN, CREATININE, CALCIUM in the last 72 hours.  Invalid input(s): CO CBG (last 3)  No results for input(s): GLUCAP in the last 72 hours.  Wt Readings from Last 3 Encounters:  03/12/16 59.1 kg (130 lb 4.7 oz)  02/27/16 66.5 kg (146 lb 9.7 oz)  10/25/14 67.6 kg (149 lb)    Physical Exam:  BP (!) 162/62 (BP Location: Left Arm)   Pulse 61   Temp 97.6 F (36.4 C) (Oral)   Resp 18   Ht 5\' 7"  (1.702 m)   Wt 59.1 kg (130 lb 4.7 oz)   SpO2 98%   BMI 20.41 kg/m  Constitutional: She appears well-developed and well-nourished.  HENT: Normocephalic and atraumatic.  Eyes: EOMI. No discharge.  Cardiovascular: RRR. No JVD. Respiratory: Clear. Unlabored. GI: Soft. Bowel sounds are normal.  Musculoskeletal: She exhibits no edema. No tenderness.  Neurological: She is alert.  Right facial weakness with dysarthria Able to follow basic commands Motor: RUE 0/5 prox to distal (unchanged) RLE: 2-/5 HF, 2-/5 KE, 0/5 ADF/DP.  MAS; 2/4 elbow flexors, wrist extensors, knee flexors, 1+/4 ADF  Sensation diminished to light touch RUE/RLE  Skin: Skin is warm and dry. Intact. Psychiatric: Flat affect.   Assessment/Plan: 1. Functional deficits secondary to left corona radiata,putamen,caudate infarct which require 3+ hours per day of interdisciplinary therapy  in a comprehensive inpatient rehab setting. Physiatrist is providing close team supervision and 24 hour management of active medical problems listed below. Physiatrist and rehab team continue to assess barriers to discharge/monitor patient progress toward functional and medical goals.  Function:  Bathing Bathing position   Position: Shower  Bathing parts Body parts bathed by patient: Right arm, Chest, Abdomen, Front perineal area, Right upper leg, Right lower leg, Left upper leg Body parts bathed by helper: Left arm, Buttocks, Back  Bathing assist Assist Level: More than reasonable time, Touching or steadying assistance(Pt > 75%)      Upper Body Dressing/Undressing Upper body dressing   What is the patient wearing?: Pull over shirt/dress     Pull over shirt/dress - Perfomed by patient: Thread/unthread left sleeve Pull over shirt/dress - Perfomed by helper: Thread/unthread right sleeve, Put head through opening, Pull shirt over trunk        Upper body assist Assist Level:  (max A)      Lower Body Dressing/Undressing Lower body dressing Lower body dressing/undressing activity did not occur: N/A What is the patient wearing?: Pants     Pants- Performed by patient: Thread/unthread left pants leg Pants- Performed by helper: Thread/unthread right pants leg, Thread/unthread left pants leg, Pull pants up/down   Non-skid slipper socks- Performed by helper: Don/doff right sock, Don/doff left sock       Shoes - Performed by helper: Don/doff right shoe, Don/doff left shoe, Fasten right,  Fasten left          Lower body assist Assist for lower body dressing:  (total A)      Toileting Toileting Toileting activity did not occur: No continent bowel/bladder event Toileting steps completed by patient: Adjust clothing after toileting, Adjust clothing prior to toileting Toileting steps completed by helper: Adjust clothing prior to toileting, Performs perineal hygiene, Adjust clothing  after toileting Toileting Assistive Devices: Grab bar or rail  Toileting assist Assist level: Touching or steadying assistance (Pt.75%)   Transfers Chair/bed transfer Chair/bed transfer activity did not occur: N/A Chair/bed transfer method: Squat pivot Chair/bed transfer assist level: Maximal assist (Pt 25 - 49%/lift and lower) Chair/bed transfer assistive device: Sliding board Mechanical lift: Stedy   Locomotion Ambulation     Max distance: 67ft Assist level: Maximal assist (Pt 25 - 49%)   Wheelchair   Type: Manual Max wheelchair distance:  (50 ft) Assist Level: Maximal assistance (Pt 25 - 49%)  Cognition Comprehension Comprehension assist level: Understands basic 90% of the time/cues < 10% of the time  Expression Expression assist level: Expresses basic 50 - 74% of the time/requires cueing 25 - 49% of the time. Needs to repeat parts of sentences.  Social Interaction Social Interaction assist level: Interacts appropriately 75 - 89% of the time - Needs redirection for appropriate language or to initiate interaction.  Problem Solving Problem solving assist level: Solves basic 25 - 49% of the time - needs direction more than half the time to initiate, plan or complete simple activities  Memory Memory assist level: Recognizes or recalls 25 - 49% of the time/requires cueing 50 - 75% of the time    Medical Problem List and Plan: 1. Right hemiparesis and dysarthria/dysphagia secondary to left corona radiata,putamen,caudate infarct on 1/9  Cont CIR therapies 2. DVT Prophylaxis/Anticoagulation: Pharmaceutical: Lovenox  3. Pain Management: tylenol prn   Robaxin 500 TID PRN added 1/23 for left leg pain, likely muscular  Neurontin started 1/23  4. Mood:   Has history of anxiety--appears controlled at present  Started prozac to help with mood and recovery.   LCSW to follow for evaluation and support.  5. Neuropsych: This patient is not fully capable of making decisions on her own behalf.   6. Skin/Wound Care: Routine pressure relief measures. Maintain adequate nutritional and hydration status.  7. Fluids/Electrolytes/Nutrition: Monitor I/O. Supplements between meals.  8. HTN:  Norvasc increased to 10mg  on 1/15  Hydralazine  increased to 25 on 1/18   Cont lisinopril and metoprolol bid.   Elevated this AM, will cont to monitor 9. Dysphagia: Continue dysphagia 1, honey liquids. Offer fluids between meals to maintain adequate hydration.  10. Dyslipidemia: on lipitor  11. Prediabetes: Hgb A1c- 6.0.   Modified diet to HH/CM. RD to educate patient and family on appropriate diet.   CBGs Controlled 1/25 12. GERD: Managed On Protonix.  13. Chronic constipation: Uses softners as well as suppository daily.   Started senna S at supper followed by suppository in am.  14. Right spastic hemiparesis:   Baclofen 5 scheduled 1/22, increased to 10mg  on 1/23.   Resting hand splint and R-AFO, discussed with nursing bracing at night.  ROM with therapy.   continue stretching,positioning    Will likely need Botox as outpt 15. Hypokalemia  K+ 4.8 on 1/22    Mg+ 2.1 on 1/19  IVF with K+ 16. Hypoalbuminemia  Supplement initiated 1/13 17. CKD  Cr. 1.04 on 1/22,   IVF qHS started 1/16---continue while on  honey liq  Labs ordered for tomorrow 18. Leukocytosis- from UTI  WBCs 13.7  Afebrile  CXR reviewed, unremarkable for infectious process, small left pleural effusion 19. Hypernatremia: Resolved  Na+ 140 1/22  Cont to monitor  Labs ordered for tomorrow 20. Acute lower UTI  UA+, Ucx >Morganella and Klebseilla  Keflex changed to Bactrim on 1/25 after discussion with pharmacy    LOS (Days) 13 A FACE TO FACE EVALUATION WAS PERFORMED  Klea Nall Karis Jubanil Onica Davidovich 03/13/2016 8:23 AM

## 2016-03-13 NOTE — Progress Notes (Signed)
Speech Language Pathology Daily Session Note  Patient Details  Name: Christina LungMildred C Hollon MRN: [id number] Date of Birth: Jan 03, 1925  Today's Date: 03/13/2016 SLP Individual Time: 0802-0900 SLP Individual Time Calculation (min): 58 min  Short Term Goals: Week 2: SLP Short Term Goal 1 (Week 2): Patient will consume current diet with minimal overt s/s of aspiration with Min A verbal cues for use of swallowing compensatory strategies.  SLP Short Term Goal 2 (Week 2): Patient will consume trials of nectar-thick liquids with minimal overt s/s of aspiration over 2 sessions prior to upgrade or repeat objective assessment.  SLP Short Term Goal 3 (Week 2): Patient will utilize speech intelligibility strategies at the phrase level with Min A multimodal cues.  SLP Short Term Goal 4 (Week 2): Patient will utilize word-finding strateiges during functional tasks/conversations with Min A verbal cues.  SLP Short Term Goal 5 (Week 2): Patient will demonstrate functional problem solving for basic and familiar tasks with Mod A multimodal cues.  SLP Short Term Goal 6 (Week 2): Patient will recall new, daily information with Mod A verbal cues.   Skilled Therapeutic Interventions:  Pt was seen for skilled ST targeting goals for dysphagia and communication.  SLP facilitated the session with trials of upgraded textures during breakfast meal.  Pt consumed nectar thick liquids via cup sips with supervision verbal cues for use of rate and portion control with no overt s/s of aspiration.  Given that pt has demonstrated consistent airway protection over several consecutive sessions and had sensed penetration of materials on most recent MBS, recommend advancement to nectar thick liquids at this time.  Pt demonstrated increased difficulty masticating dys 2 textures when eating a fruit cup and had to expectorate a small amount of minced peaches as she was unable to clear residuals from the oral cavity effectively; therefore, do not  recommend advancement of solids at this time.  During conversations with therapist in a quiet environment, pt needed min assist verbal cues to increase vocal intensity to achieve intelligibility at the phrase level.  Pt was left in bed with bed alarm set and call bell within reach.  Continue per current plan of care.       Function:  Eating Eating   Modified Consistency Diet: Yes Eating Assist Level: Supervision or verbal cues;Set up assist for   Eating Set Up Assist For: Opening containers       Cognition Comprehension Comprehension assist level: Understands basic 90% of the time/cues < 10% of the time  Expression   Expression assist level: Expresses basic 75 - 89% of the time/requires cueing 10 - 24% of the time. Needs helper to occlude trach/needs to repeat words.  Social Interaction Social Interaction assist level: Interacts appropriately 75 - 89% of the time - Needs redirection for appropriate language or to initiate interaction.  Problem Solving Problem solving assist level: Solves basic 25 - 49% of the time - needs direction more than half the time to initiate, plan or complete simple activities  Memory Memory assist level: Recognizes or recalls 25 - 49% of the time/requires cueing 50 - 75% of the time    Pain Pain Assessment Pain Assessment: No/denies pain  Therapy/Group: Individual Therapy  Estefano Victory, Joni ReiningNicole L 03/13/2016, 9:00 AM

## 2016-03-13 NOTE — Progress Notes (Signed)
Occupational Therapy Session Note  Patient Details  Name: Christina Bradshaw MRN: [id number] Date of Birth: 1924/07/15  Today's Date: 03/13/2016 OT Individual Time: 1400-1430 OT Individual Time Calculation (min): 30 min   Skilled Therapeutic Interventions/Progress Updates:    1:1 OT session focused on R NMR and ROM/self-ROM. Pt came to sitting EOB with Max A and was instructed on self ROM techniques for R Ue. Pt demonstrated understanding and was able to maintain sitting balance w/o UE support with close supervision. Pt then brought back to bed and rolled to L for R NMR in gravity eliminated position. Focus on scap protraction/retraction, elbow flex/ext- OT provided joint input to bring pt through full ROM. R forearm pronation/supination with trace muscle activation w/ increased tone. Pt left semi-reclined in bed with needs met.   Therapy Documentation Precautions:  Precautions Precautions: Fall Precaution Comments: dense Rt hemiplegia Restrictions Weight Bearing Restrictions: No Pain: Pain Assessment Pain Assessment: No/denies pain  See Function Navigator for Current Functional Status.   Therapy/Group: Individual Therapy  Valma Cava 03/13/2016, 2:22 PM

## 2016-03-13 NOTE — Progress Notes (Signed)
Occupational Therapy Session Note  Patient Details  Name: Christina Bradshaw MRN: [id number] Date of Birth: [birth year]  Today's Date: 03/13/2016 OT Individual Time: 1001-1058 OT Individual Time Calculation (min): 57 min    Short Term Goals: Week 2:  OT Short Term Goal 1 (Week 2): Pt will complete LB dressing with Max A (1 helper) OT Short Term Goal 2 (Week 2): Pt will complete bathing with Min A and AE PRN OT Short Term Goal 3 (Week 2): Pt will complete toilet transfers with Max A and LRAD  OT Short Term Goal 4 (Week 2): Pt will utilize R UE as stabilizer for grooming tasks with min cues  Week 3:     Skilled Therapeutic Interventions/Progress Updates:     Upon arrival Pt supine in bed. Pt agreeable to tx this date but lethargic. Pt req MAX A to change brief, but able to wipe peri area. Pt rolls R /c min VC to bend knee and L with MAX A. While supine, Pt requires TOTAL A to don pull up pants. Therapist threaded BLE and Pt able to pull pants up thighs, but require A to advance pants past hips. Pt transitions side-lying > seated EOB /c MAX A for trunk facilitation. Pt dons pullover shirt /c MOD A to thread RUE and head /c VC for hemi dressing sequencing. Pt squat pivot tanfer EOB > WC /c TOTAL A . Pt attempts to don B socks and footwear in seated figure 4 but unable to reach toes. Pt able to pull sock over heel & push heel into shoe. While seated at sink Pt combs hair, and inserts dentures with supervision and VC for thoroughness to complete task. [facility], Pt applies deodorant and lotion to RUE to improve attention to LUE. Pt left seated in w/c with call light in reach.   Therapy Documentation Precautions:  Precautions Precautions: Fall Precaution Comments: dense Rt hemiplegia Restrictions Weight Bearing Restrictions: No General:   Vital Signs:  Pain: Pain Assessment Pain Assessment: No/denies pain Pain Score: 0-No pain ADL:   Exercises:   Other Treatments:    See  Function Navigator for Current Functional Status.   Therapy/Group: Individual Therapy  Shon HaleStephanie M Schlosser 03/13/2016, 12:02 PM

## 2016-03-13 NOTE — Progress Notes (Signed)
Physical Therapy Session Note  Patient Details  Name: Christina Bradshaw MRN: [id number] Date of Birth: 01/06/1925  Today's Date: 03/13/2016 PT Individual Time: 1120-1200 PT Individual Time Calculation (min): 40 min   Short Term Goals: Week 2:  PT Short Term Goal 1 (Week 2): Pt will be able to perform functional transfers with mod assist PT Short Term Goal 2 (Week 2): Pt will be able to progress sit <> stand with mod assist PT Short Term Goal 3 (Week 2): Pt will be able to gait x 25' with max assist  PT Short Term Goal 4 (Week 2): Pt will be able to propel w/c with hemi-technique x 100' with supervision  Skilled Therapeutic Interventions/Progress Updates:   Patient asleep in wheelchair upon arrival, fatigued but agreeable to therapy. Patient performed sit <> stand transfers with max A and cues for L hand placement, squat pivot to L and slide board transfers to R with max A and verbal cues for sequencing and technique. Gait using L rail in hallway x 30 ft with max verbal cues for upright posture and mod A overall to advance/stabilize RLE with verbal cues for sequencing and gait using L hemiwalker x 14 ft with max A overall limited by patient fatigue. Patient requested to return to bed at end of session, transferred sit > L sidelying with mod A overall and left in L sidelying with all needs in reach and bed alarm on.   Therapy Documentation Precautions:  Precautions Precautions: Fall Precaution Comments: dense Rt hemiplegia Restrictions Weight Bearing Restrictions: No Pain: Pain Assessment Pain Assessment: No/denies pain  See Function Navigator for Current Functional Status.   Therapy/Group: Individual Therapy  Crytal Pensinger, Prudencio PairRebecca A 03/13/2016, 1:24 PM

## 2016-03-13 NOTE — Progress Notes (Signed)
Patient very drowsy today, as she was yesterday.  She has been able to participate in therapies more today but still appears to be increasingly fatigued.  Marissa NestlePam Love, PA aware, modified medications and ordered to hold 1400 baclofen dose.  Changes also made to cover UTI which could affect LOC as well.  Dani Gobbleeardon, Cyprian Gongaware J, RN

## 2016-03-14 ENCOUNTER — Inpatient Hospital Stay (HOSPITAL_COMMUNITY): Payer: Medicare Other

## 2016-03-14 ENCOUNTER — Inpatient Hospital Stay (HOSPITAL_COMMUNITY): Payer: Medicare Other | Admitting: Occupational Therapy

## 2016-03-14 ENCOUNTER — Inpatient Hospital Stay (HOSPITAL_COMMUNITY): Payer: Medicare Other | Admitting: Speech Pathology

## 2016-03-14 DIAGNOSIS — R5383 Other fatigue: Secondary | ICD-10-CM

## 2016-03-14 LAB — BASIC METABOLIC PANEL
ANION GAP: 6 (ref 5–15)
BUN: 23 mg/dL — AB (ref 6–20)
CHLORIDE: 108 mmol/L (ref 101–111)
CO2: 22 mmol/L (ref 22–32)
Calcium: 8.4 mg/dL — ABNORMAL LOW (ref 8.9–10.3)
Creatinine, Ser: 1.07 mg/dL — ABNORMAL HIGH (ref 0.44–1.00)
GFR, EST AFRICAN AMERICAN: 52 mL/min — AB (ref >60)
GFR, EST NON AFRICAN AMERICAN: 45 mL/min — AB (ref >60)
Glucose, Bld: 89 mg/dL (ref 65–99)
POTASSIUM: 4.7 mmol/L (ref 3.5–5.1)
SODIUM: 136 mmol/L (ref 135–145)

## 2016-03-14 LAB — CBC WITH DIFFERENTIAL/PLATELET
BASOS ABS: 0.1 10*3/uL (ref 0.0–0.1)
Basophils Relative: 1 %
EOS PCT: 3 %
Eosinophils Absolute: 0.3 10*3/uL (ref 0.0–0.7)
HCT: 38.8 % (ref 36.0–46.0)
HEMOGLOBIN: 12.8 g/dL (ref 12.0–15.0)
LYMPHS ABS: 2.1 10*3/uL (ref 0.7–4.0)
LYMPHS PCT: 19 %
MCH: 31.1 pg (ref 26.0–34.0)
MCHC: 33 g/dL (ref 30.0–36.0)
MCV: 94.4 fL (ref 78.0–100.0)
Monocytes Absolute: 1 10*3/uL (ref 0.1–1.0)
Monocytes Relative: 9 %
NEUTROS PCT: 68 %
Neutro Abs: 7.7 10*3/uL (ref 1.7–7.7)
PLATELETS: 225 10*3/uL (ref 150–400)
RBC: 4.11 MIL/uL (ref 3.87–5.11)
RDW: 14.1 % (ref 11.5–15.5)
WBC: 11.2 10*3/uL — AB (ref 4.0–10.5)

## 2016-03-14 MED ORDER — BACLOFEN 5 MG HALF TABLET
5.0000 mg | ORAL_TABLET | Freq: Three times a day (TID) | ORAL | Status: DC
Start: 2016-03-14 — End: 2016-03-26
  Administered 2016-03-14 – 2016-03-26 (×37): 5 mg via ORAL
  Filled 2016-03-14 (×37): qty 1

## 2016-03-14 MED ORDER — ADULT MULTIVITAMIN W/MINERALS CH
1.0000 | ORAL_TABLET | Freq: Every day | ORAL | Status: DC
  Administered 2016-03-15 – 2016-03-26 (×12): 1 via ORAL
  Filled 2016-03-14 (×12): qty 1

## 2016-03-14 NOTE — Progress Notes (Signed)
Left hand IV removed; 2 unsuccessful attempts; IV team consult ordered.  Harlow AsaSochima O Ebrima Ranta, RN 03/14/2016

## 2016-03-14 NOTE — Plan of Care (Signed)
Problem: RH Ambulation Goal: LTG Patient will ambulate in controlled environment (PT) LTG: Patient will ambulate in a controlled environment, # of feet with assistance (PT).  Downgraded due to lack of progress ABG Goal: LTG Patient will ambulate in home environment (PT) LTG: Patient will ambulate in home environment, # of feet with assistance (PT).  Outcome: Not Applicable Date Met: 12/03/49 D/c due to lack of progress ABG  Problem: RH Stairs Goal: LTG Patient will ambulate up and down stairs w/assist (PT) LTG: Patient will ambulate up and down # of stairs with assistance (PT)  Outcome: Not Applicable Date Met: 02/58/52 D/c due to family has ramp access and lack of progress towards stair goals. ABG

## 2016-03-14 NOTE — Progress Notes (Signed)
Occupational Therapy Session Note  Patient Details  Name: Christina Bradshaw MRN: [id number] Date of Birth: [birth year]  Today's Date: 03/14/2016 OT Individual Time:  - 1005-1049 Individual Treatment Time Calculation: 44 min    Short Term Goals: Week 1:  OT Short Term Goal 1 (Week 1): Pt will complete sit > stand with max assist to complete LB dressing/hygiene OT Short Term Goal 1 - Progress (Week 1): Progressing toward goal OT Short Term Goal 2 (Week 1): Pt will complete toilet transfers with max assist 3 out of 4 attempts OT Short Term Goal 2 - Progress (Week 1): Not met OT Short Term Goal 3 (Week 1): Pt will don pants with max assist of one caregiver OT Short Term Goal 3 - Progress (Week 1): Progressing toward goal OT Short Term Goal 4 (Week 1): Pt will position RUE during self-care tasks with min cues to increase awareness of positioning and decrease injury to shoulder OT Short Term Goal 4 - Progress (Week 1): Not met Week 2:  OT Short Term Goal 1 (Week 2): Pt will complete LB dressing with Max A (1 helper) OT Short Term Goal 2 (Week 2): Pt will complete bathing with Min A and AE PRN OT Short Term Goal 3 (Week 2): Pt will complete toilet transfers with Max A and LRAD  OT Short Term Goal 4 (Week 2): Pt will utilize R UE as stabilizer for grooming tasks with min cues   Skilled Therapeutic Interventions/Progress Updates: Pt was received in hallway via PT handoff. She was agreeable to tx. Pt self propelled in hallway to therapy apartment, Min A for avoiding simple environmental barriers/navigating through doorways with extra time and instruction on hemi technique. Once in apartment, pt completed seated towel glides, instructed on active assist technique while completing shoulder protraction/retraction, IR/ER, and circumduction exercises x10 reps 2 sets for increasing R UE shoulder ability. Pt required rest breaks due to fatigue. Reported "not feeling well" but denying pain. RN and PA made aware.  Discussed psych consult with pt due to decrease in affect today. Pt self propelled back to room in manner as written above. Pt was left with R shoulder sling and all needs within reach at time of departure.      Therapy Documentation Precautions:  Precautions Precautions: Fall Precaution Comments: dense Rt hemiplegia Restrictions Weight Bearing Restrictions: No   Pain: No c/o pain during session  Pain Assessment Pain Assessment: No/denies pain ADL:  :    See Function Navigator for Current Functional Status.   Therapy/Group: Individual Therapy  Takuya Lariccia A Carlen Rebuck 03/14/2016, 4:40 PM

## 2016-03-14 NOTE — Progress Notes (Signed)
Speech Language Pathology Weekly Progress and Session Note  Patient Details  Name: Christina Bradshaw MRN: [id number] Date of Birth: 1924/10/11  Beginning of progress report period: March 07, 2016  End of progress report period: March 14, 2016   Today's Date: 03/14/2016 SLP Individual Time: 1230-1300 SLP Individual Time Calculation (min): 30 min  Short Term Goals: Week 2: SLP Short Term Goal 1 (Week 2): Patient will consume current diet with minimal overt s/s of aspiration with Min A verbal cues for use of swallowing compensatory strategies.  SLP Short Term Goal 1 - Progress (Week 2): Met SLP Short Term Goal 2 (Week 2): Patient will consume trials of nectar-thick liquids with minimal overt s/s of aspiration over 2 sessions prior to upgrade or repeat objective assessment.  SLP Short Term Goal 2 - Progress (Week 2): Met SLP Short Term Goal 3 (Week 2): Patient will utilize speech intelligibility strategies at the phrase level with Min A multimodal cues.  SLP Short Term Goal 3 - Progress (Week 2): Progressing toward goal SLP Short Term Goal 4 (Week 2): Patient will utilize word-finding strateiges during functional tasks/conversations with Min A verbal cues.  SLP Short Term Goal 4 - Progress (Week 2): Met SLP Short Term Goal 5 (Week 2): Patient will demonstrate functional problem solving for basic and familiar tasks with Mod A multimodal cues.  SLP Short Term Goal 5 - Progress (Week 2): Progressing toward goal SLP Short Term Goal 6 (Week 2): Patient will recall new, daily information with Mod A verbal cues.  SLP Short Term Goal 6 - Progress (Week 2): Progressing toward goal    New Short Term Goals: Week 3: SLP Short Term Goal 1 (Week 3): Pt will consume dys 1 textures and nectar thick liquids with supervision verbal cues for use of swallowing precautions.  SLP Short Term Goal 2 (Week 3): Pt will consume therapeutic trials of thin liquids with minimal overt s/s of aspiration over 2  consecutive sessions to demonstrate readiness for repeat MBS.  SLP Short Term Goal 3 (Week 3): Patient will utilize speech intelligibility strategies at the phrase level with Min A multimodal cues.  SLP Short Term Goal 4 (Week 3): Patient will utilize word-finding strateiges during functional tasks/conversations with supervision verbal cues.  SLP Short Term Goal 5 (Week 3): Patient will demonstrate functional problem solving for basic and familiar tasks with Mod A multimodal cues.  SLP Short Term Goal 6 (Week 3): Patient will recall new, daily information with Mod A verbal cues.   Weekly Progress Updates:  Pt has demonstrated slow, functional progress this reporting period and has met 3 out of 6 short term goals.  Pt's diet has been upgraded to dys 1 textures and nectar thick liquids which pt is consuming with min assist-supervision for use of swallowing precautions.  She is demonstrating improved mastication of dys 2 textures in comparison to initial evaluation but prefers to remain on pureed textures at this time due to mouth pain.  Pt has demonstrated improved word finding with min assist verbal cues but requires mod-max assist for speech intelligibility, problem solving, and recall of daily information.  As a result, pt would continue to benefit from skilled ST while inpatient in order to maximize functional independence and reduce burden of care prior to discharge.  Formal family education has not been completed this reporting period as pt's family has not been consistently present during therapy sessions; however, family remains very involved in pt's care and have been updated regarding pt's  progress as they have been able to attend sessions.       Intensity: Minumum of 1-2 x/day, 30 to 90 minutes Frequency: 3 to 5 out of 7 days Duration/Length of Stay: 3.5 weeks  Treatment/Interventions: Cognitive remediation/compensation;Cueing hierarchy;Functional tasks;Patient/family education;Therapeutic  Activities;Internal/external aids;Speech/Language facilitation;Environmental controls   Daily Session  Skilled Therapeutic Interventions: Pt was seen for skilled ST targeting goals for speech and swallowing.  Therapist facilitated the session with a trial meal tray of dys 2 textures and nectar thick liquids to continue working towards diet advancement.  Pt consumed advanced solids with supervision-min assist to clear solids from the oral cavity.  No overt s/s of aspiration were evident with advanced solids or thickened liquids; however, pt reports mouth pain with intake and prefers to remain on purees at this time.  No significant areas of ulceration noted when examining the oral cavity; RN made aware.  During conversations with SLP, pt needed overall mod-max assist verbal cues to increase vocal intensity, slow rate, and overarticulate to achieve intelligibility at the phrase level.  Pt was left in wheelchair with call bell within reach at the end of today's therapy session.  Goals updated on this date to reflect current progress and plan of care.      Function:   Eating Eating   Modified Consistency Diet: Yes Eating Assist Level: Supervision or verbal cues;Set up assist for   Eating Set Up Assist For: Opening containers       Cognition Comprehension Comprehension assist level: Understands basic 90% of the time/cues < 10% of the time  Expression   Expression assist level: Expresses basic 50 - 74% of the time/requires cueing 25 - 49% of the time. Needs to repeat parts of sentences.  Social Interaction Social Interaction assist level: Interacts appropriately 75 - 89% of the time - Needs redirection for appropriate language or to initiate interaction.  Problem Solving Problem solving assist level: Solves basic 25 - 49% of the time - needs direction more than half the time to initiate, plan or complete simple activities  Memory Memory assist level: Recognizes or recalls 25 - 49% of the  time/requires cueing 50 - 75% of the time   General    Pain Pain Assessment Pain Assessment: No/denies pain  Therapy/Group: Individual Therapy  Aaliyana Fredericks, Selinda Orion 03/14/2016, 4:07 PM

## 2016-03-14 NOTE — Progress Notes (Signed)
Patient continues to be "weak and tired". Drop in BP with PT therefore not ambulated. Will decrease baclofen to tid.  Neuropsych consult to help with mood and coping.

## 2016-03-14 NOTE — Progress Notes (Signed)
Physical Therapy Note  Patient Details  Name: Christina Bradshaw MRN: [id number] Date of Birth: 11-20-1924 Today's Date: 03/14/2016  0910-1004, 54 min individual tx Pain: none reported  Pt bridged up partially to assist with donning pants in bed.  Rolling L with mod assist to complete pants over R hip.  L sidelying > sit with mod assist, using rail. Slide board transfer to L with mod/max assist.  PT donned pt's L arm sling. Pt sat in w/c to eat breakfast with mod/max cue for sqallowing precautions.  Pt tends to take too large of bites.  W/c propulsion x 75' using hemi method with supervision.  Seated neuromuscular re-education R hip ext, knee ext, x 10 with multimodal cues.  PROM L heel cord x 1 min.  Pt's cheeks noted to be pink; informed PA.  BP seated at rest = 110/47, HR 58. PT handed pt off to OT for next session.   See function navigator for current status   Aki Burdin 03/14/2016, 7:57 AM

## 2016-03-14 NOTE — Progress Notes (Signed)
[place] PHYSICAL MEDICINE & REHABILITATION     PROGRESS NOTE  Subjective/Complaints:  Pt seen laying in bed this AM.  She states she fells asleep fine last night, but then woke up and had trouble falling back asleep.  Per nursing, however, pt lethargic during the day and nauseated this AM.    ROS: Denies nausea, vomiting, diarrhea, shortness of breath or chest pain.  Objective: Vital Signs: Blood pressure (!) 142/64, pulse 64, temperature 99.3 F (37.4 C), temperature source Oral, resp. rate 16, height 5\' 7"  (1.702 m), weight 59.1 kg (130 lb 4.7 oz), SpO2 96 %. No results found.  Recent Labs  03/14/16 0531  WBC 11.2*  HGB 12.8  HCT 38.8  PLT 225    Recent Labs  03/14/16 0531  NA 136  K 4.7  CL 108  GLUCOSE 89  BUN 23*  CREATININE 1.07*  CALCIUM 8.4*   CBG (last 3)  No results for input(s): GLUCAP in the last 72 hours.  Wt Readings from Last 3 Encounters:  03/12/16 59.1 kg (130 lb 4.7 oz)  02/27/16 66.5 kg (146 lb 9.7 oz)  10/25/14 67.6 kg (149 lb)    Physical Exam:  BP (!) 142/64 (BP Location: Left Arm)   Pulse 64   Temp 99.3 F (37.4 C) (Oral)   Resp 16   Ht 5\' 7"  (1.702 m)   Wt 59.1 kg (130 lb 4.7 oz)   SpO2 96%   BMI 20.41 kg/m  Constitutional: She appears well-developed and well-nourished.  HENT: Normocephalic and atraumatic.  Eyes: EOMI. No discharge.  Cardiovascular: RRR. No JVD. Respiratory: Clear. Unlabored. GI: Soft. Bowel sounds are normal.  Musculoskeletal: She exhibits no edema. No tenderness.  Neurological: She is alert.  Right facial weakness with dysarthria Able to follow basic commands Motor: RUE 0/5 prox to distal (stable) RLE: 2-/5 HF, 2-/5 KE, 0/5 ADF/DP.  mAS: 2/4 elbow flexors, wrist extensors, knee flexors, 1+/4 ADF  Sensation diminished to light touch RUE/RLE  Skin: Skin is warm and dry. Intact. Psychiatric: Flat affect.   Assessment/Plan: 1. Functional deficits secondary to left corona radiata,putamen,caudate infarct  which require 3+ hours per day of interdisciplinary therapy in a comprehensive inpatient rehab setting. Physiatrist is providing close team supervision and 24 hour management of active medical problems listed below. Physiatrist and rehab team continue to assess barriers to discharge/monitor patient progress toward functional and medical goals.  Function:  Bathing Bathing position   Position: Shower  Bathing parts Body parts bathed by patient: Right arm, Chest, Abdomen, Front perineal area, Right upper leg, Right lower leg, Left upper leg Body parts bathed by helper: Left arm, Buttocks, Back  Bathing assist Assist Level: More than reasonable time, Touching or steadying assistance(Pt > 75%)      Upper Body Dressing/Undressing Upper body dressing   What is the patient wearing?: Pull over shirt/dress     Pull over shirt/dress - Perfomed by patient: Thread/unthread left sleeve, Pull shirt over trunk Pull over shirt/dress - Perfomed by helper: Thread/unthread right sleeve, Put head through opening        Upper body assist Assist Level: Touching or steadying assistance(Pt > 75%)      Lower Body Dressing/Undressing Lower body dressing Lower body dressing/undressing activity did not occur: N/A What is the patient wearing?: Pants     Pants- Performed by patient: Thread/unthread left pants leg Pants- Performed by helper: Thread/unthread right pants leg, Thread/unthread left pants leg, Pull pants up/down   Non-skid slipper socks- Performed by  helper: Don/doff right sock, Don/doff left sock       Shoes - Performed by helper: Don/doff right shoe, Don/doff left shoe, Fasten right, Fasten left          Lower body assist Assist for lower body dressing:  (total A)      Toileting Toileting Toileting activity did not occur: No continent bowel/bladder event Toileting steps completed by patient: Adjust clothing after toileting, Adjust clothing prior to toileting Toileting steps completed  by helper: Adjust clothing prior to toileting, Performs perineal hygiene, Adjust clothing after toileting Toileting Assistive Devices: Grab bar or rail  Toileting assist Assist level: Touching or steadying assistance (Pt.75%)   Transfers Chair/bed transfer Chair/bed transfer activity did not occur: N/A Chair/bed transfer method: Squat pivot, Lateral scoot Chair/bed transfer assist level: Total assist (Pt < 25%) Chair/bed transfer assistive device: Sliding board, Armrests Mechanical lift: Stedy   Locomotion Ambulation     Max distance: 14 ft Assist level: Maximal assist (Pt 25 - 49%)   Wheelchair   Type: Manual Max wheelchair distance:  (50 ft) Assist Level: Maximal assistance (Pt 25 - 49%)  Cognition Comprehension Comprehension assist level: Understands basic 90% of the time/cues < 10% of the time  Expression Expression assist level: Expresses basic 25 - 49% of the time/requires cueing 50 - 75% of the time. Uses single words/gestures.  Social Interaction Social Interaction assist level: Interacts appropriately 75 - 89% of the time - Needs redirection for appropriate language or to initiate interaction.  Problem Solving Problem solving assist level: Solves basic 25 - 49% of the time - needs direction more than half the time to initiate, plan or complete simple activities  Memory Memory assist level: Recognizes or recalls 50 - 74% of the time/requires cueing 25 - 49% of the time    Medical Problem List and Plan: 1. Right hemiparesis and dysarthria/dysphagia secondary to left corona radiata,putamen,caudate infarct on 1/9  Cont CIR therapies 2. DVT Prophylaxis/Anticoagulation: Pharmaceutical: Lovenox  3. Pain Management: tylenol prn   Robaxin 500 TID PRN added 1/23 for left leg pain, likely muscular  Neurontin started 1/23  4. Mood:   Has history of anxiety--appears controlled at present  Started prozac to help with mood and recovery.   LCSW to follow for evaluation and support.   5. Neuropsych: This patient is not fully capable of making decisions on her own behalf.  6. Skin/Wound Care: Routine pressure relief measures. Maintain adequate nutritional and hydration status.  7. Fluids/Electrolytes/Nutrition: Monitor I/O. Supplements between meals.  8. HTN:  Norvasc increased to 10mg  on 1/15  Hydralazine  increased to 25 on 1/18   Cont lisinopril and metoprolol bid.   Overall controlled 1/26 9. Dysphagia: Continue dysphagia 1, honey liquids. Offer fluids between meals to maintain adequate hydration.  10. Dyslipidemia: on lipitor  11. Prediabetes: Hgb A1c- 6.0.   Modified diet to HH/CM. RD to educate patient and family on appropriate diet.   CBGs Controlled 1/26 12. GERD: Managed On Protonix.  13. Chronic constipation: Uses softners as well as suppository daily.   Started senna S at supper followed by suppository in am.  14. Right spastic hemiparesis:   Baclofen 5 scheduled 1/22, increased to 10mg  on 1/23.  Spoke to nursing regarding monitoring for lethargy, will consider changes today as needed.    Resting hand splint and R-AFO, discussed with nursing bracing at night.  ROM with therapy.   continue stretching,positioning    Will likely need Botox as outpt 15. Hypokalemia  K+  4.7 on 1/26    Mg+ 2.1 on 1/19  IVF with K+ qHS 16. Hypoalbuminemia  Supplement initiated 1/13 17. CKD iii  Cr. 1.07 on 1/26   IVF qHS started 1/16---continue while on honey liq 18. Leukocytosis- from UTI  WBCs 11.2, improving  Afebrile  CXR reviewed, unremarkable for infectious process, small left pleural effusion 19. Hypernatremia: Resolved  Na+ 136 1/26  Cont to monitor 20. Acute lower UTI  UA+, Ucx >Morganella and [place]  Bactrim started on 1/25    LOS (Days) 14 A FACE TO FACE EVALUATION WAS PERFORMED  Christina Bradshaw Christina Bradshaw 03/14/2016 8:15 AM

## 2016-03-14 NOTE — Progress Notes (Signed)
Pt BP 120/56 and Pulse 59 at 21:25. Order by Amador CunasKwiatkowski, MD to hold hydralazine and administer metoprolol. Orders followed.  Alcario DroughtSochima Daquan Crapps, RN 03/14/2016

## 2016-03-14 NOTE — Progress Notes (Signed)
Physical Therapy Weekly Progress Note  Patient Details  Name: Christina Bradshaw MRN: [id number] Date of Birth: 26-Aug-1924  Beginning of progress report period: March 08, 2016 End of progress report period: March 14, 2016  Today's Date: 03/14/2016 PT Individual Time: 6333-5456 PT Individual Time Calculation (min): 58 min   Pt reports significant fatigue but agreeable to participate in session. Discussed and left handout in room for home measurements to be filled out with family. Discussed downgrading gait goals with patients as well as current recommendation will be for w/c level at home only due to limited progress with gait. Pt verbalized understanding and in agreement. Focused on neuro re-ed to address postural control, weightbearing, and gait training with L rail in hallway. Pt required max assist for sit <> stands and cues for hand placement and blocking of R knee. Max assist for gait with total assist to advance RLE with cues for upright posture and hip extension. +2 needed for w/c follow and pt quickly fatigued. Neuro re-ed on Kinetron in seated position from w/c for reciprocal movement pattern re-training and activation of RLE musculature at hip. Completed on resistance of 80 cm/sec and 60 cm/sec multiple trials with rest breaks. PT changed cushion from basic cushion to M2 for better positioning and alignment. Max assist slideboard transfer back to bed end of session with cues for weightshift, head/hips relationship, and hand placement.   Patient has met 2 of 4 short term goals.  Pt is not making much functional progress with mobility. Pt has been increasingly lethargic and fatigued this week with + UTI. Pt continues to require max assist for transfers and standing activities. Gait has been very limited. Family still with plan to discharge home, so recommended starting hands on family education next week.   Patient continues to demonstrate the following deficits muscle weakness, muscle  joint tightness and muscle paralysis, decreased cardiorespiratoy endurance, impaired timing and sequencing, abnormal tone, decreased coordination and decreased motor planning, decreased attention to right, decreased initiation, decreased awareness, decreased problem solving, decreased memory and delayed processing and decreased sitting balance, decreased standing balance, decreased postural control, hemiplegia and decreased balance strategies and therefore will continue to benefit from skilled PT intervention to increase functional independence with mobility.  Patient not progressing toward long term goals.  See goal revision..  Plan of care revisions: discharged home environment gait goal and modified controlled environment gait goal due to lack of progress. .  PT Short Term Goals Week 2:  PT Short Term Goal 1 (Week 2): Pt will be able to perform functional transfers with mod assist PT Short Term Goal 1 - Progress (Week 2): Not met PT Short Term Goal 2 (Week 2): Pt will be able to progress sit <> stand with mod assist PT Short Term Goal 2 - Progress (Week 2): Not met PT Short Term Goal 3 (Week 2): Pt will be able to gait x 25' with max assist  PT Short Term Goal 3 - Progress (Week 2): Met PT Short Term Goal 4 (Week 2): Pt will be able to propel w/c with hemi-technique x 100' with supervision PT Short Term Goal 4 - Progress (Week 2): Met Week 3:  PT Short Term Goal 1 (Week 3): Pt will be able to perform basic transfers with mod assist PT Short Term Goal 2 (Week 3): Pt will be able to perform sit <> stands with mod assist PT Short Term Goal 3 (Week 3): Pt will be able to gait x 25' with mod  assist PT Short Term Goal 4 (Week 3): Family/caregivers will begin hands-on family education  Skilled Therapeutic Interventions/Progress Updates:  Ambulation/gait training;Balance/vestibular training;Cognitive remediation/compensation;Discharge planning;Disease management/prevention;DME/adaptive equipment  instruction;Functional mobility training;Neuromuscular re-education;Pain management;Patient/family education;Psychosocial support;Skin care/wound management;Splinting/orthotics;Stair training;Therapeutic Activities;Therapeutic Exercise;UE/LE Strength taining/ROM;UE/LE Coordination activities;Visual/perceptual remediation/compensation;Wheelchair propulsion/positioning   Therapy Documentation Precautions:  Precautions Precautions: Fall Precaution Comments: dense Rt hemiplegia Restrictions Weight Bearing Restrictions: No Pain:  c/o fatigue, no pain.    See Function Navigator for Current Functional Status.  Therapy/Group: Individual Therapy  Canary Brim Ivory Broad, PT, DPT  03/14/2016, 7:45 AM

## 2016-03-15 ENCOUNTER — Inpatient Hospital Stay (HOSPITAL_COMMUNITY): Payer: Medicare Other | Admitting: Speech Pathology

## 2016-03-15 DIAGNOSIS — I69351 Hemiplegia and hemiparesis following cerebral infarction affecting right dominant side: Principal | ICD-10-CM

## 2016-03-15 MED ORDER — ENSURE ENLIVE PO LIQD
237.0000 mL | Freq: Two times a day (BID) | ORAL | Status: DC
  Administered 2016-03-15 – 2016-03-23 (×9): 237 mL via ORAL

## 2016-03-15 NOTE — Progress Notes (Signed)
Initial Nutrition Assessment  DOCUMENTATION CODES:   Not applicable; pt is at risk for developing acute malnutrition if po intake does not improve  INTERVENTION:  -Recommend Ensure Enlive po BID, each supplement provides 350 kcal and 20 grams of protein -Continue Prostat BID -Cater to pt preferences   NUTRITION DIAGNOSIS:   Inadequate oral intake related to poor appetite as evidenced by per patient/family report, meal completion < 25%.  GOAL:   Patient will meet greater than or equal to 90% of their needs  MONITOR:   PO intake, Supplement acceptance, Weight trends, Skin, Labs  REASON FOR ASSESSMENT:   Malnutrition Screening Tool    ASSESSMENT:    83 yo female admitted with right hemiparesis and dysarthria/dysphagia secondary to left corona radiata,putamen,caudate infarct 83 yo female admitted with right hemiparesis and dysarthria/dysphagia secondary to left corona radiata,putamen,caudate infarct   Pt reports poor appetite since admission; ate 100% of yogurt and OJ this AM. Pt reports she does not fond of the "ice cream" [employer](Magic Cup). Pt also receiving Prostat supplement, taking sometimes Recorded po intake of meals 43% on average since admission; appetite has decreased since admission with pt eating only 16% on average of last 8 meals; pt currently on Dsyphagia I, Nectar Thick diet, SLP following and has modified diet throughout admission  No clear wt loss trend since admission; weight actually trended up   Pt reports good appetite prior to admission, eating 3 meals per day with no wt loss.   Labs: no CBGs, serum glucose range 89-121, HgbA1c 6.0 Meds: MVI  Nutrition-Focused physical exam completed. Findings are no fat depletion, normal to mild muscle depletion, and no edema.   Diet Order:  DIET - DYS 1 Room service appropriate? Yes; Fluid consistency: Nectar Thick  Skin:   (deep tissue injury right heel)  Last BM:  03/13/16  Height:   Ht Readings from Last 1  Encounters:  02/29/16 5\' 7"  (1.702 m)    Weight:   Wt Readings from Last 1 Encounters:  03/12/16 130 lb 4.7 oz (59.1 kg)   Filed Weights   03/01/16 0407 03/05/16 1404 03/12/16 1405  Weight: 118 lb 13.3 oz (53.9 kg) 119 lb 7.8 oz (54.2 kg) 130 lb 4.7 oz (59.1 kg)     Ideal Body Weight:     BMI:  Body mass index is 20.41 kg/m.  Estimated Nutritional Needs:   Kcal:  1500-1800 kcals  Protein:  70-85 g  Fluid:  >/= 1.5 L  EDUCATION NEEDS:   No education needs identified at this time  Romelle StarcherCate Bert Ptacek MS, RD, LDN [phone] Pager  [phone] Weekend/On-Call Pager

## 2016-03-15 NOTE — Progress Notes (Signed)
Speech Language Pathology Daily Session Note  Patient Details  Name: Christina Bradshaw MRN: [id number] Date of Birth: 1924/12/07  Today's Date: 03/15/2016 SLP Individual Time: 1345-1430 SLP Individual Time Calculation (min): 45 min  Short Term Goals: Week 3: SLP Short Term Goal 1 (Week 3): Pt will consume dys 1 textures and nectar thick liquids with supervision verbal cues for use of swallowing precautions.  SLP Short Term Goal 2 (Week 3): Pt will consume therapeutic trials of thin liquids with minimal overt s/s of aspiration over 2 consecutive sessions to demonstrate readiness for repeat MBS.  SLP Short Term Goal 3 (Week 3): Patient will utilize speech intelligibility strategies at the phrase level with Min A multimodal cues.  SLP Short Term Goal 4 (Week 3): Patient will utilize word-finding strateiges during functional tasks/conversations with supervision verbal cues.  SLP Short Term Goal 5 (Week 3): Patient will demonstrate functional problem solving for basic and familiar tasks with Mod A multimodal cues.  SLP Short Term Goal 6 (Week 3): Patient will recall new, daily information with Mod A verbal cues.   Skilled Therapeutic Interventions: Skilled treatment session focused on dysphagia and communication goals. SLP facilitated session by providing ice chips and thin liquids via spoon. Pt didn't display any overt s/s of aspiration with trials but she swallow initiation appeared to fatigue as trials progress as evidenced by decrease hyolaryngeal excursion as trials progressed. Speech goals focused on communication at the word level. Given Mod A verbal cues, pt able to utilize speech intelligibility strategies to name a common object in barrier task for ~75% intelligibility in quiet environment. Daughter present during session. Pt left upright in bed in daughter's presence and all needs within reach. Continue per current plan of care.      Function:  Eating Eating   Modified Consistency Diet:  No (Trials of thin liquids with ST only) Eating Assist Level: Supervision or verbal cues;Set up assist for;Helper feeds patient   Eating Set Up Assist For: Opening containers       Cognition Comprehension Comprehension assist level: Understands basic 90% of the time/cues < 10% of the time  Expression   Expression assist level: Expresses basic 50 - 74% of the time/requires cueing 25 - 49% of the time. Needs to repeat parts of sentences.  Social Interaction Social Interaction assist level: Interacts appropriately 75 - 89% of the time - Needs redirection for appropriate language or to initiate interaction.  Problem Solving Problem solving assist level: Solves basic 25 - 49% of the time - needs direction more than half the time to initiate, plan or complete simple activities  Memory Memory assist level: Recognizes or recalls 25 - 49% of the time/requires cueing 50 - 75% of the time    Pain    Therapy/Group: Individual Therapy Arlene Genova B. [name], M.S., CCC-SLP Speech-Language Pathologist  Gabriellah Rabel 03/15/2016, 3:55 PM

## 2016-03-15 NOTE — Progress Notes (Signed)
Christina Bradshaw is a 83 y.o. female [birth year] [id number]  Subjective: C/o being tired this am  Objective: Vital signs in last 24 hours: Temp:  [98.6 F (37 C)-99 F (37.2 C)] 98.6 F (37 C) (01/27 0609) Pulse Rate:  [59-64] 64 (01/27 0950) Resp:  [16-18] 16 (01/27 0609) BP: (120-130)/(53-58) 130/53 (01/27 0950) SpO2:  [95 %-98 %] 95 % (01/27 0609) Weight change:  Last BM Date: 03/13/16  Intake/Output from previous day: 01/26 0701 - 01/27 0700 In: 720 [P.O.:720] Out: -  Last cbgs: CBG (last 3)  No results for input(s): GLUCAP in the last 72 hours.   Physical Exam General: No apparent distress   HEENT: not dry Lungs: Normal effort. Lungs clear to auscultation, no crackles or wheezes. Cardiovascular: Regular rate and rhythm, no edema Abdomen: S/NT/ND; BS(+) Musculoskeletal:  unchanged Neurological: No new neurological deficits Wounds: N/A    Skin: clear  Aging changes Mental state: Alert, cooperative    Lab Results: BMET    Component Value Date/Time   NA 136 03/14/2016 0531   NA 138 07/11/2013 0516   K 4.7 03/14/2016 0531   K 4.0 07/11/2013 0516   CL 108 03/14/2016 0531   CL 104 07/11/2013 0516   CO2 22 03/14/2016 0531   CO2 27 07/11/2013 0516   GLUCOSE 89 03/14/2016 0531   GLUCOSE 91 07/11/2013 0516   BUN 23 (H) 03/14/2016 0531   BUN 11 07/11/2013 0516   CREATININE 1.07 (H) 03/14/2016 0531   CREATININE 0.96 07/11/2013 0516   CALCIUM 8.4 (L) 03/14/2016 0531   CALCIUM 9.6 07/11/2013 0516   GFRNONAA 45 (L) 03/14/2016 0531   GFRNONAA 54 (L) 07/11/2013 0516   GFRAA 52 (L) 03/14/2016 0531   GFRAA >60 07/11/2013 0516   CBC    Component Value Date/Time   WBC 11.2 (H) 03/14/2016 0531   RBC 4.11 03/14/2016 0531   HGB 12.8 03/14/2016 0531   HGB 15.0 07/11/2013 0516   HCT 38.8 03/14/2016 0531   HCT 43.9 07/11/2013 0516   PLT 225 03/14/2016 0531   PLT 218 07/11/2013 0516   MCV 94.4 03/14/2016 0531   MCV 93 07/11/2013 0516   MCH 31.1 03/14/2016 0531    MCHC 33.0 03/14/2016 0531   RDW 14.1 03/14/2016 0531   RDW 13.9 07/11/2013 0516   LYMPHSABS 2.1 03/14/2016 0531   LYMPHSABS 2.4 07/11/2013 0516   MONOABS 1.0 03/14/2016 0531   MONOABS 0.8 07/11/2013 0516   EOSABS 0.3 03/14/2016 0531   EOSABS 0.2 07/11/2013 0516   BASOSABS 0.1 03/14/2016 0531   BASOSABS 0.1 07/11/2013 0516    Studies/Results: No results found.  Medications: I have reviewed the patient's current medications.  Assessment/Plan:  1. Right hemiparesis due to CVA             Cont CIR therapies 2. Proph of DVT w/Lovenox 3. Pain: Tylenol prn             Robaxin 500 TID PRN             Neurontin started 1/23  4. Anxiety: Prozac             LCSW to follow for evaluation and support.  5. Neuropsych: This patient is not fully capable of making decisions on her own behalf.  6. Skin/Wound Care: Routine pressure relief measures. Maintain adequate nutritional and hydration status.  7. Fluids/Electrolytes/Nutrition: PO 8. HTN - worse:             New meds added: Norvasc,  Hydralazine              Cont lisinopril and metoprolol bid.              Elevated this AM, will cont to monitor 9. Dysphagia: Continue dysphagia 1, honey liquids. Offer fluids between meals to maintain adequate hydration.  10. Dyslipidemia - Lipitor 11. DM - on diet             Modified diet to HH/CM. RD to educate patient and family on appropriate diet.              12. GERD - Protonix 13. Chronic constipation: LOC.  14. Right spastic hemiparesis:              Baclofen 5 scheduled 1/22, increased to 10mg  on 1/23.              Resting hand splint and R-AFO, discussed with nursing bracing at night.             ROM with therapy.              continue stretching,positioning               Will likely need Botox as outpt 15. Hypokalemia             K+ 4.8 on 1/22               Mg+ 2.1 on 1/19             IVF with K+ 16. Hypoalbuminemia             Supplement initiated 1/13 17. CKD             Cr.  1.04 on 1/22,              IVF qHS started 1/16---continue while on honey liq             Labs ordered for tomorrow 18. Leukocytosis- from UTI             WBCs 13.7             Afebrile             CXR reviewed, unremarkable for infectious process, small left pleural effusion 19. Hypernatremia: Resolved             Na+ 140 1/22             Cont to monitor             Labs ordered for tomorrow 20. Acute lower UTI             UA+, Ucx >Morganella and Klebseilla             Keflex changed to Bactrim on 1/25 after discussion with pharmacy 21: Fatigue - meds may be contributing. Will watch                  Length of stay, days: 15  Sonda PrimesAlex Plotnikov , MD 03/15/2016, 10:54 AM

## 2016-03-16 ENCOUNTER — Inpatient Hospital Stay (HOSPITAL_COMMUNITY): Payer: Medicare Other | Admitting: Speech Pathology

## 2016-03-16 NOTE — Progress Notes (Signed)
Speech Language Pathology Daily Session Note  Patient Details  Name: Christina Bradshaw MRN: [id number] Date of Birth: [birth year]  Today's Date: 03/16/2016 SLP Individual Time: 1100-1130 SLP Individual Time Calculation (min): 30 min  Short Term Goals: Week 3: SLP Short Term Goal 1 (Week 3): Pt will consume dys 1 textures and nectar thick liquids with supervision verbal cues for use of swallowing precautions.  SLP Short Term Goal 2 (Week 3): Pt will consume therapeutic trials of thin liquids with minimal overt s/s of aspiration over 2 consecutive sessions to demonstrate readiness for repeat MBS.  SLP Short Term Goal 3 (Week 3): Patient will utilize speech intelligibility strategies at the phrase level with Min A multimodal cues.  SLP Short Term Goal 4 (Week 3): Patient will utilize word-finding strateiges during functional tasks/conversations with supervision verbal cues.  SLP Short Term Goal 5 (Week 3): Patient will demonstrate functional problem solving for basic and familiar tasks with Mod A multimodal cues.  SLP Short Term Goal 6 (Week 3): Patient will recall new, daily information with Mod A verbal cues.   Skilled Therapeutic Interventions: Skilled treatment session focused on dysphagia and speech goals. SLP facilitated session by placing bottom dentures only d/t top dentures continue to be sore at gum line. Oral cavity inspected and no obvious redness or sores present. With bottom dentures in place, pt consumed trials of dysphagia 2 with good mastication and good lingual manipulation of bolus with only Mod I. Pt with cough x 1. Pt with fatigue after trial and would recommend skilled observation with full dysphagia 2 tray before upgrade. Pt able to communicate at the phrase level with ~75% intelligilibility with Min A verbal cues. Pt left in bed with daughter present and all needs within reach. Continue per current plan of care.      Function:  Eating Eating   Modified Consistency Diet:  Yes Eating Assist Level: Supervision or verbal cues;Set up assist for;Helper feeds patient   Eating Set Up Assist For: Opening containers       Cognition Comprehension Comprehension assist level: Understands basic 90% of the time/cues < 10% of the time  Expression   Expression assist level: Expresses basic 50 - 74% of the time/requires cueing 25 - 49% of the time. Needs to repeat parts of sentences.  Social Interaction Social Interaction assist level: Interacts appropriately 75 - 89% of the time - Needs redirection for appropriate language or to initiate interaction.  Problem Solving Problem solving assist level: Solves basic 25 - 49% of the time - needs direction more than half the time to initiate, plan or complete simple activities  Memory Memory assist level: Recognizes or recalls 25 - 49% of the time/requires cueing 50 - 75% of the time    Pain Pain Assessment Pain Assessment: Faces Pain Score: 4  Faces Pain Scale: Hurts little more Pain Type: Acute pain Pain Location: Shoulder Pain Orientation: Right Pain Frequency: Intermittent Pain Onset: Gradual Pain Intervention(s): Medication (See eMAR)  Therapy/Group: Individual Therapy [name] B. [name], M.S., CCC-SLP Speech-Language Pathologist  [name] 03/16/2016, 11:34 AM

## 2016-03-16 NOTE — Progress Notes (Addendum)
Patient given am suppository per order. Multiple incontinent bowels. Toileted patient but unsuccessful at continent bowel/void. Attempted to place patient in chair for morning meal but too fatigued following multiple bowel movements. Resting comfortably in bed eating breakfast.   1838 Patient was able to sit up for dinner and for 45 minutes following dinner.

## 2016-03-16 NOTE — Progress Notes (Addendum)
Christina Bradshaw is a 83 y.o. female 1925/01/23 [id number]  Subjective: No new complaints. No new problems.   Objective: Vital signs in last 24 hours: Temp:  [98.8 F (37.1 C)] 98.8 F (37.1 C) (01/28 0606) Pulse Rate:  [60-66] 60 (01/28 0606) Resp:  [16] 16 (01/28 0606) BP: (106-140)/(46-56) 106/46 (01/28 0606) SpO2:  [94 %-95 %] 94 % (01/28 0606) Weight change:  Last BM Date: 03/15/16  Intake/Output from previous day: 01/27 0701 - 01/28 0700 In: 240 [P.O.:240] Out: -  Last cbgs: CBG (last 3)  No results for input(s): GLUCAP in the last 72 hours.   Physical Exam General: No apparent distress   HEENT: not dry Lungs: Normal effort. Lungs clear to auscultation, no crackles or wheezes. Cardiovascular: Regular rate and rhythm, no edema Abdomen: S/NT/ND; BS(+) Musculoskeletal:  unchanged Neurological: No new neurological deficits Wounds: N/A    Skin: clear  Aging changes Mental state: Alert, cooperative    Lab Results: BMET    Component Value Date/Time   NA 136 03/14/2016 0531   NA 138 07/11/2013 0516   K 4.7 03/14/2016 0531   K 4.0 07/11/2013 0516   CL 108 03/14/2016 0531   CL 104 07/11/2013 0516   CO2 22 03/14/2016 0531   CO2 27 07/11/2013 0516   GLUCOSE 89 03/14/2016 0531   GLUCOSE 91 07/11/2013 0516   BUN 23 (H) 03/14/2016 0531   BUN 11 07/11/2013 0516   CREATININE 1.07 (H) 03/14/2016 0531   CREATININE 0.96 07/11/2013 0516   CALCIUM 8.4 (L) 03/14/2016 0531   CALCIUM 9.6 07/11/2013 0516   GFRNONAA 45 (L) 03/14/2016 0531   GFRNONAA 54 (L) 07/11/2013 0516   GFRAA 52 (L) 03/14/2016 0531   GFRAA >60 07/11/2013 0516   CBC    Component Value Date/Time   WBC 11.2 (H) 03/14/2016 0531   RBC 4.11 03/14/2016 0531   HGB 12.8 03/14/2016 0531   HGB 15.0 07/11/2013 0516   HCT 38.8 03/14/2016 0531   HCT 43.9 07/11/2013 0516   PLT 225 03/14/2016 0531   PLT 218 07/11/2013 0516   MCV 94.4 03/14/2016 0531   MCV 93 07/11/2013 0516   MCH 31.1 03/14/2016 0531    MCHC 33.0 03/14/2016 0531   RDW 14.1 03/14/2016 0531   RDW 13.9 07/11/2013 0516   LYMPHSABS 2.1 03/14/2016 0531   LYMPHSABS 2.4 07/11/2013 0516   MONOABS 1.0 03/14/2016 0531   MONOABS 0.8 07/11/2013 0516   EOSABS 0.3 03/14/2016 0531   EOSABS 0.2 07/11/2013 0516   BASOSABS 0.1 03/14/2016 0531   BASOSABS 0.1 07/11/2013 0516    Studies/Results: No results found.  Medications: I have reviewed the patient's current medications.  Assessment and plan:  1. Right-sided hemiparesis - continue with CIR therapies 2. DVT prophylaxis with Lovenox 3. Pain control with Tylenol when necessary Robaxin when necessary and Neurontin 4. Anxiety continue with Prozac 5. Hypertension on Norvasc and hydralazine lisinopril and metoprolol. Continue to monitor 6. Dyslipidemia on diet and Lipitor 7. DM2 - on diet 8. Hyponatremia obtain labs   Length of stay, days: 16  Sonda PrimesAlex Plotnikov , MD 03/16/2016, 9:10 AM

## 2016-03-16 NOTE — Progress Notes (Signed)
Pt BP 147/56 and Pulse 64. Hydralazine held [place] Will continue to monitor.  Harlow AsaSochima O Jayce Boyko, RN 03/16/2016

## 2016-03-16 NOTE — Progress Notes (Signed)
Orthopedic Tech Progress Note Patient Details:  Christina LungMildred C Swarm [birth year] [id number]  Ortho Devices Type of Ortho Device: Sling immobilizer Ortho Device/Splint Location: Assisted pt with Arm Sling.  Pt did not need Sling immobilzer strap.  re-applied Sling immobilizer without abdominal strap.  Nurse trained on use and pt tolerated well. (Right Arm) Ortho Device/Splint Interventions: Application   Alvina ChouWilliams, Deah Ottaway C 03/16/2016, 1:50 PM

## 2016-03-17 ENCOUNTER — Inpatient Hospital Stay (HOSPITAL_COMMUNITY): Payer: Medicare Other | Admitting: Occupational Therapy

## 2016-03-17 ENCOUNTER — Inpatient Hospital Stay (HOSPITAL_COMMUNITY): Payer: Medicare Other | Admitting: Speech Pathology

## 2016-03-17 ENCOUNTER — Inpatient Hospital Stay (HOSPITAL_COMMUNITY): Payer: Medicare Other | Admitting: Physical Therapy

## 2016-03-17 ENCOUNTER — Encounter (HOSPITAL_COMMUNITY): Payer: Medicare Other | Admitting: Psychology

## 2016-03-17 DIAGNOSIS — E871 Hypo-osmolality and hyponatremia: Secondary | ICD-10-CM

## 2016-03-17 LAB — BASIC METABOLIC PANEL
ANION GAP: 7 (ref 5–15)
BUN: 34 mg/dL — ABNORMAL HIGH (ref 6–20)
CALCIUM: 8.5 mg/dL — AB (ref 8.9–10.3)
CO2: 21 mmol/L — ABNORMAL LOW (ref 22–32)
CREATININE: 1.24 mg/dL — AB (ref 0.44–1.00)
Chloride: 106 mmol/L (ref 101–111)
GFR, EST AFRICAN AMERICAN: 43 mL/min — AB (ref >60)
GFR, EST NON AFRICAN AMERICAN: 37 mL/min — AB (ref >60)
Glucose, Bld: 101 mg/dL — ABNORMAL HIGH (ref 65–99)
Potassium: 4.5 mmol/L (ref 3.5–5.1)
SODIUM: 134 mmol/L — AB (ref 135–145)

## 2016-03-17 MED ORDER — LISINOPRIL 20 MG PO TABS
20.0000 mg | ORAL_TABLET | Freq: Every day | ORAL | Status: DC
  Administered 2016-03-18 – 2016-03-25 (×8): 20 mg via ORAL
  Filled 2016-03-17 (×9): qty 1

## 2016-03-17 MED ORDER — BISACODYL 10 MG RE SUPP: 10.0000 mg | RECTAL | Status: DC

## 2016-03-17 MED ORDER — SODIUM CHLORIDE 0.9 % IV SOLN
INTRAVENOUS | Status: DC
  Administered 2016-03-17 – 2016-03-18 (×2): via INTRAVENOUS
  Filled 2016-03-17 (×3): qty 1000

## 2016-03-17 NOTE — Progress Notes (Signed)
[facility] PHYSICAL MEDICINE & REHABILITATION     PROGRESS NOTE  Subjective/Complaints:  Pt seen sitting up in her chair this AM.  She states she slept fairly overnight.  Spoke to nursing regarding BP meds.  Reviewed weekend notes, BP meds held due to relatively low SBP.   ROS: Denies nausea, vomiting, diarrhea, shortness of breath or chest pain.  Objective: Vital Signs: Blood pressure 129/61, pulse 63, temperature 98.4 F (36.9 C), temperature source Oral, resp. rate 17, height 5\' 7"  (1.702 m), weight 59.1 kg (130 lb 4.7 oz), SpO2 95 %. No results found. No results for input(s): WBC, HGB, HCT, PLT in the last 72 hours.  Recent Labs  03/17/16 0233  NA 134*  K 4.5  CL 106  GLUCOSE 101*  BUN 34*  CREATININE 1.24*  CALCIUM 8.5*   CBG (last 3)  No results for input(s): GLUCAP in the last 72 hours.  Wt Readings from Last 3 Encounters:  03/12/16 59.1 kg (130 lb 4.7 oz)  02/27/16 66.5 kg (146 lb 9.7 oz)  10/25/14 67.6 kg (149 lb)    Physical Exam:  BP 129/61 (BP Location: Left Arm)   Pulse 63   Temp 98.4 F (36.9 C) (Oral)   Resp 17   Ht 5\' 7"  (1.702 m)   Wt 59.1 kg (130 lb 4.7 oz)   SpO2 95%   BMI 20.41 kg/m  Constitutional: She appears well-developed and well-nourished.  HENT: Normocephalic and atraumatic.  Eyes: EOMI. No discharge.  Cardiovascular: RRR. No JVD. Respiratory: Clear. Unlabored. GI: Soft. Bowel sounds are normal.  Musculoskeletal: She exhibits no edema. No tenderness.  Neurological: She is alert.  Right facial weakness with dysarthria Able to follow basic commands Motor: RUE 0/5 prox to distal (unchanged) RLE: 2-/5 HF, 2-/5 KE, 0/5 ADF/DP.  mAS: 1+/4 elbow flexors, wrist extensors, knee flexors, 2/4 ADF  Skin: Skin is warm and dry. Intact. Psychiatric: Flat affect.   Assessment/Plan: 1. Functional deficits secondary to left corona radiata,putamen,caudate infarct which require 3+ hours per day of interdisciplinary therapy in a comprehensive  inpatient rehab setting. Physiatrist is providing close team supervision and 24 hour management of active medical problems listed below. Physiatrist and rehab team continue to assess barriers to discharge/monitor patient progress toward functional and medical goals.  Function:  Bathing Bathing position   Position: Shower  Bathing parts Body parts bathed by patient: Right arm, Chest, Abdomen, Front perineal area, Right upper leg, Right lower leg, Left upper leg Body parts bathed by helper: Left arm, Buttocks, Back  Bathing assist Assist Level: More than reasonable time, Touching or steadying assistance(Pt > 75%)      Upper Body Dressing/Undressing Upper body dressing   What is the patient wearing?: Pull over shirt/dress     Pull over shirt/dress - Perfomed by patient: Thread/unthread left sleeve, Pull shirt over trunk Pull over shirt/dress - Perfomed by helper: Thread/unthread right sleeve, Put head through opening        Upper body assist Assist Level: Touching or steadying assistance(Pt > 75%)      Lower Body Dressing/Undressing Lower body dressing Lower body dressing/undressing activity did not occur: N/A What is the patient wearing?: Pants     Pants- Performed by patient: Thread/unthread left pants leg Pants- Performed by helper: Thread/unthread right pants leg, Thread/unthread left pants leg, Pull pants up/down   Non-skid slipper socks- Performed by helper: Don/doff right sock, Don/doff left sock       Shoes - Performed by helper: Don/doff right shoe,  Don/doff left shoe, Fasten right, Fasten left          Lower body assist Assist for lower body dressing:  (total A)      Toileting Toileting Toileting activity did not occur: Refused Toileting steps completed by patient: Adjust clothing after toileting, Adjust clothing prior to toileting Toileting steps completed by helper: Adjust clothing prior to toileting, Performs perineal hygiene, Adjust clothing after  toileting Toileting Assistive Devices: Grab bar or rail  Toileting assist Assist level: Touching or steadying assistance (Pt.75%)   Transfers Chair/bed transfer Chair/bed transfer activity did not occur: N/A Chair/bed transfer method: Lateral scoot Chair/bed transfer assist level: Maximal assist (Pt 25 - 49%/lift and lower) Chair/bed transfer assistive device: Sliding board, Armrests Mechanical lift: Stedy   Locomotion Ambulation     Max distance: 15' Assist level: 2 helpers (+2 for w/c follow)   Wheelchair   Type: Manual Max wheelchair distance: 75 Assist Level: Supervision or verbal cues  Cognition Comprehension Comprehension assist level: Understands basic 90% of the time/cues < 10% of the time  Expression Expression assist level: Expresses basic 50 - 74% of the time/requires cueing 25 - 49% of the time. Needs to repeat parts of sentences.  Social Interaction Social Interaction assist level: Interacts appropriately 75 - 89% of the time - Needs redirection for appropriate language or to initiate interaction.  Problem Solving Problem solving assist level: Solves basic 25 - 49% of the time - needs direction more than half the time to initiate, plan or complete simple activities  Memory Memory assist level: Recognizes or recalls 25 - 49% of the time/requires cueing 50 - 75% of the time    Medical Problem List and Plan: 1. Right hemiparesis and dysarthria/dysphagia secondary to left corona radiata,putamen,caudate infarct on 1/9  Cont CIR therapies 2. DVT Prophylaxis/Anticoagulation: Pharmaceutical: Lovenox  3. Pain Management: tylenol prn   Robaxin 500 TID PRN   Neurontin started 1/23  4. Mood:   Has history of anxiety--appears controlled at present  Started prozac to help with mood and recovery.   LCSW to follow for evaluation and support.  5. Neuropsych: This patient is not fully capable of making decisions on her own behalf.  6. Skin/Wound Care: Routine pressure relief  measures. Maintain adequate nutritional and hydration status.  7. Fluids/Electrolytes/Nutrition: Monitor I/O. Supplements between meals.  8. HTN:  Norvasc increased to 10mg  on 1/15  Hydralazine  increased to 25 on 1/18   Cont lisinopril and metoprolol bid.   Overall controlled 1/29 9. Dysphagia: Continue dysphagia 1, honey liquids. Offer fluids between meals to maintain adequate hydration.  10. Dyslipidemia: on lipitor  11. Prediabetes: Hgb A1c- 6.0.   Modified diet to HH/CM. RD to educate patient and family on appropriate diet.   CBGs Controlled 1/29 12. GERD: Managed On Protonix.  13. Chronic constipation: Uses softners as well as suppository daily.   Started senna S at supper followed by suppository in am.  14. Right spastic hemiparesis:   Baclofen 5 TID.   Resting hand splint and R-AFO, discussed with nursing bracing at night.  ROM with therapy.   continue stretching,positioning    Will likely need Botox as outpt 15. Hypokalemia: Resolved  K+ 4.5 on 1/29   Mg+ 2.1 on 1/19  IVF with K+ qHS 16. Hypoalbuminemia  Supplement initiated 1/13 17. CKD III  With AKI, Cr. 1.24 on 1/29  IVF qHS started 1/16---continue while on honey liq, changed to NS  Cont to monitor 18. Leukocytosis- from UTI  WBCs  11.2  Afebrile  CXR reviewed, unremarkable for infectious process, small left pleural effusion 19. Hyponatremia:   Na+ 134 1/29  Cont to monitor 20. Acute lower UTI  UA+, Ucx >Morganella and Klebseilla  Bactrim started on 1/25-2/1    LOS (Days) 17 A FACE TO FACE EVALUATION WAS PERFORMED  Ankit Karis Juba 03/17/2016 8:24 AM

## 2016-03-17 NOTE — Progress Notes (Signed)
Physical Therapy Session Note  Patient Details  Name: Christina Bradshaw MRN: [id number] Date of Birth: September 16, 1924  Today's Date: 03/17/2016 PT Individual Time: 1300-1400 PT Individual Time Calculation (min): 60 min   Short Term Goals: Week 3:  PT Short Term Goal 1 (Week 3): Pt will be able to perform basic transfers with mod assist PT Short Term Goal 2 (Week 3): Pt will be able to perform sit <> stands with mod assist PT Short Term Goal 3 (Week 3): Pt will be able to gait x 25' with mod assist PT Short Term Goal 4 (Week 3): Family/caregivers will begin hands-on family education  Skilled Therapeutic Interventions/Progress Updates: Pt presented in bed agreeable to therapy. Performed rolling L minA, R maxA for peri care due to episode of incontinence. Performed supine to sit with HOB elevated and use of bed rail with maxA. MaxA donning pants and pt able to perform sit to stand via Stedy to pull pants up and transfer to w/c.  Transported pt to rehab gym due to time management. Reviewed and practiced SB transfer. MaxA trf to L with cues for head/hip relationship. Min/modA transfer to R. Pt propelled to room with modA, cues for sequencing.  Encouraged pt to remain in w/c until next therapy session. Pt left in w/c with call bell within reach, son present and all current needs met.      Therapy Documentation Precautions:  Precautions Precautions: Fall Precaution Comments: dense Rt hemiplegia Restrictions Weight Bearing Restrictions: No General:   Vital Signs: Therapy Vitals Temp: 98 F (36.7 C) Pulse Rate: 67 Resp: 16 BP: (!) 123/53 Patient Position (if appropriate): Lying Oxygen Therapy SpO2: 98 %   See Function Navigator for Current Functional Status.   Therapy/Group: Individual Therapy  Dearl Rudden  Dyllen Menning, PTA  03/17/2016, 3:35 PM

## 2016-03-17 NOTE — Progress Notes (Signed)
Occupational Therapy Session Note  Patient Details  Name: Christina Bradshaw MRN: [id number] Date of Birth: [birth year]  Today's Date: 03/17/2016 OT Individual Time: 4967-5916 OT Individual Time Calculation (min): 44 min    Short Term Goals: Week 1:  OT Short Term Goal 1 (Week 1): Pt will complete sit > stand with max assist to complete LB dressing/hygiene OT Short Term Goal 1 - Progress (Week 1): Progressing toward goal OT Short Term Goal 2 (Week 1): Pt will complete toilet transfers with max assist 3 out of 4 attempts OT Short Term Goal 2 - Progress (Week 1): Not met OT Short Term Goal 3 (Week 1): Pt will don pants with max assist of one caregiver OT Short Term Goal 3 - Progress (Week 1): Progressing toward goal OT Short Term Goal 4 (Week 1): Pt will position RUE during self-care tasks with min cues to increase awareness of positioning and decrease injury to shoulder OT Short Term Goal 4 - Progress (Week 1): Not met Week 2:  OT Short Term Goal 1 (Week 2): Pt will complete LB dressing with Max A (1 helper) OT Short Term Goal 2 (Week 2): Pt will complete bathing with Min A and AE PRN OT Short Term Goal 3 (Week 2): Pt will complete toilet transfers with Max A and LRAD  OT Short Term Goal 4 (Week 2): Pt will utilize R UE as stabilizer for grooming tasks with min cues     Skilled Therapeutic Interventions/Progress Updates: Pt was sitting in w/c at time of arrival, reported not feeling well "in my head." Pt expressed feelings of discouragement regarding her progress/current functional level. Pt was provided with therapeutic listening, encouragement and support to increase self efficacy and motivation at CIR. BP assessed, 141/55 with RN made aware. Pt then participated in graded w/c obstacle course in dayroom while listening to her favorite music (gospel) in order to enhance affect. Pt able to navigate in/out of 2 doorways with close supervision for avoiding left sided barriers while instructed on  hemi technique. Afterwards she was returned to her room, transferred back to bed via squat pivot with Max A. She was repositioned for comfort and left with all needs and family present at time of departure.      Therapy Documentation Precautions:  Precautions Precautions: Fall Precaution Comments: dense Rt hemiplegia Restrictions Weight Bearing Restrictions: No General:   Vital Signs: Therapy Vitals Temp: 98 F (36.7 C) Temp Source: Oral Pulse Rate: 71 Resp: 16 BP: (!) 140/52 Patient Position (if appropriate): Lying Oxygen Therapy SpO2: 98 % Pain: No c/o pain during session    ADL:        See Function Navigator for Current Functional Status.   Therapy/Group: Individual Therapy  Tarig Zimmers A Erhard Senske 03/17/2016, 7:39 PM

## 2016-03-17 NOTE — Progress Notes (Signed)
Speech Language Pathology Daily Session Note  Patient Details  Name: Christina Bradshaw MRN: [id number] Date of Birth: 04-12-1924  Today's Date: 03/17/2016 SLP Individual Time: [id number] SLP Individual Time Calculation (min): 45 min  Short Term Goals: Week 3: SLP Short Term Goal 1 (Week 3): Pt will consume dys 1 textures and nectar thick liquids with supervision verbal cues for use of swallowing precautions.  SLP Short Term Goal 2 (Week 3): Pt will consume therapeutic trials of thin liquids with minimal overt s/s of aspiration over 2 consecutive sessions to demonstrate readiness for repeat MBS.  SLP Short Term Goal 3 (Week 3): Patient will utilize speech intelligibility strategies at the phrase level with Min A multimodal cues.  SLP Short Term Goal 4 (Week 3): Patient will utilize word-finding strateiges during functional tasks/conversations with supervision verbal cues.  SLP Short Term Goal 5 (Week 3): Patient will demonstrate functional problem solving for basic and familiar tasks with Mod A multimodal cues.  SLP Short Term Goal 6 (Week 3): Patient will recall new, daily information with Mod A verbal cues.   Skilled Therapeutic Interventions: Skilled treatment session focused on dysphagia and speech goals. SLP facilitated session by providing skilled observation of trial snack consisting of dysphagia 2 textures with nectar thick liquids. Pt with adequate mastication, good bolus manipulation and complete oral clearing. Pt also with increased endurance and finished all of snack before fatigue. Will attempt trial dysphagia 2 lunch tray prior to upgrade. Pt required Min A verbal cues to communicate at the phrase level with ~90% intelligibility. Pt left in recliner with all needs within reach. Continue per current plan of care.      Function:  Eating Eating   Modified Consistency Diet: Yes Eating Assist Level: Supervision or verbal cues;Set up assist for   Eating Set Up Assist For: Opening  containers       Cognition Comprehension Comprehension assist level: Understands basic 90% of the time/cues < 10% of the time  Expression   Expression assist level: Expresses basic 50 - 74% of the time/requires cueing 25 - 49% of the time. Needs to repeat parts of sentences.  Social Interaction Social Interaction assist level: Interacts appropriately 75 - 89% of the time - Needs redirection for appropriate language or to initiate interaction.  Problem Solving Problem solving assist level: Solves basic 25 - 49% of the time - needs direction more than half the time to initiate, plan or complete simple activities  Memory Memory assist level: Recognizes or recalls 25 - 49% of the time/requires cueing 50 - 75% of the time    Pain Pain Assessment Pain Assessment: Faces Faces Pain Scale: Hurts a little bit Pain Type: Acute pain Pain Location: Generalized Pain Frequency: Intermittent Pain Onset: Gradual Pain Intervention(s): Medication (See eMAR)  Therapy/Group: Individual Therapy  Christina Bradshaw, M.S., CCC-SLP Speech-Language Pathologist  Christina Bradshaw 03/17/2016, 9:53 AM

## 2016-03-17 NOTE — Progress Notes (Signed)
Social Work Patient ID: Christina Bradshaw, female   DOB: December 12, 1924, 83 y.o.   MRN: [id number]   LATE ENTRY:  Met with pt and daughter last week to review team conference information. Both understand that progress is slow with a couple of medical issues as well.  Stressed to daughter that, if they family still plans to take pt home, then we will need to start family ed this week.  She reports that all of the children are to "talk through things" this past weekend and she will follow up with me beginning of this week.  Will keep team posted if d/c plan to change.  Christina Oliva, LCSW

## 2016-03-17 NOTE — Progress Notes (Signed)
Occupational Therapy Session Note  Patient Details  Name: Christina Bradshaw MRN: [id number] Date of Birth: Aug 31, 1924  Today's Date: 03/17/2016 OT Individual Time: 0704-0800 OT Individual Time Calculation (min): 56 min    Short Term Goals: Week 2:  OT Short Term Goal 1 (Week 2): Pt will complete LB dressing with Max A (1 helper) OT Short Term Goal 2 (Week 2): Pt will complete bathing with Min A and AE PRN OT Short Term Goal 3 (Week 2): Pt will complete toilet transfers with Max A and LRAD  OT Short Term Goal 4 (Week 2): Pt will utilize R UE as stabilizer for grooming tasks with min cues    Skilled Therapeutic Interventions/Progress Updates:    Upon entering the room, pt supine in bed and reports having just fallen asleep as she had difficulty sleeping throughout the night. Pt required max A for lifting and lowering to transfer from bed >wheelchair with stand pivot. Pt required set up and min cues to manage breakfast safety with swallowing strategies. OT and pt discussed upcoming discharge date and pt states, " I don't think my family is gonna be able to take care of me at home. " OT providing education for other options if this is the case/and change in plan. Pt verbalized understanding. Pt declined further transfers or self care tasks secondary to fatigue. Call bell and all needed items within reach upon exiting the room.   Therapy Documentation Precautions:  Precautions Precautions: Fall Precaution Comments: dense Rt hemiplegia Restrictions Weight Bearing Restrictions: No General:   Vital Signs: Therapy Vitals Temp: 98 F (36.7 C) Pulse Rate: 67 Resp: 16 BP: (!) 123/53 Patient Position (if appropriate): Lying Oxygen Therapy SpO2: 98 % Pain: Pain Assessment Pain Assessment: Faces Faces Pain Scale: Hurts a little bit Pain Type: Acute pain Pain Location: Generalized Pain Frequency: Intermittent Pain Onset: Gradual Pain Intervention(s): Medication (See eMAR) ADL:    Exercises:   Other Treatments:    See Function Navigator for Current Functional Status.   Therapy/Group: Individual Therapy  Christina Bradshaw, Christina Bradshaw 03/17/2016, 12:50 PM

## 2016-03-18 ENCOUNTER — Ambulatory Visit (HOSPITAL_COMMUNITY): Payer: Medicare Other | Admitting: Speech Pathology

## 2016-03-18 ENCOUNTER — Inpatient Hospital Stay (HOSPITAL_COMMUNITY): Payer: Medicare Other | Admitting: Physical Therapy

## 2016-03-18 ENCOUNTER — Inpatient Hospital Stay (HOSPITAL_COMMUNITY): Payer: Medicare Other | Admitting: Occupational Therapy

## 2016-03-18 NOTE — Plan of Care (Signed)
Problem: RH BOWEL ELIMINATION Goal: RH STG MANAGE BOWEL WITH ASSISTANCE STG Manage Bowel with mod Assistance.  Outcome: Not Progressing Total A     

## 2016-03-18 NOTE — Progress Notes (Signed)
Physical Therapy Session Note  Patient Details  Name: Christina Bradshaw MRN: [id number] Date of Birth: 10/31/1924  Today's Date: 03/18/2016 PT Individual Time: 1307-1330 PT Individual Time Calculation (min): 23 min   Short Term Goals: Week 3:  PT Short Term Goal 1 (Week 3): Pt will be able to perform basic transfers with mod assist PT Short Term Goal 2 (Week 3): Pt will be able to perform sit <> stands with mod assist PT Short Term Goal 3 (Week 3): Pt will be able to gait x 25' with mod assist PT Short Term Goal 4 (Week 3): Family/caregivers will begin hands-on family education  Skilled Therapeutic Interventions/Progress Updates:   Patient resting in bed upon arrival. Session focused on L sidelying > sit with rail and HOB raised with mod Bradshaw, sitting balance with supervision while PT donned shoes total Bradshaw, slide board transfer from bed to wheelchair to R with mod Bradshaw overall, simulated car transfer to sedan height via slide board transfer with max Bradshaw and assist to lift RLE in/out of car, and wheelchair propulsion via L hemi technique, 2 x 75 ft with supervision and verbal/visual cues for R obstacle negotiation. Patient left sitting in wheelchair with 1/2 lap tray in place and needs within reach.    Therapy Documentation Precautions:  Precautions Precautions: Fall Precaution Comments: dense Rt hemiplegia Restrictions Weight Bearing Restrictions: No Pain:  Denies pain  See Function Navigator for Current Functional Status.   Therapy/Group: Individual Therapy  Christina Bradshaw, Christina Bradshaw 03/18/2016, 3:55 PM

## 2016-03-18 NOTE — Progress Notes (Signed)
[facility]IC[facility] & REHABILITATION     PROGRESS NOTE  Subjective/Complaints:  Pt sitting up in bed this AM, about to work with PT.  She states she slept well overnight.  She notes improve in her LE pain as well.   ROS: Denies nausea, vomiting, diarrhea, shortness of breath or chest pain.  Objective: Vital Signs: Blood pressure 129/60, pulse 70, temperature 98.7 F (37.1 C), temperature source Oral, resp. rate 18, height 5\' 7"  (1.702 m), weight 59.1 kg (130 lb 4.7 oz), SpO2 95 %. No results found. No results for input(s): WBC, HGB, HCT, PLT in the last 72 hours.  Recent Labs  03/17/16 0233  NA 134*  K 4.5  CL 106  GLUCOSE 101*  BUN 34*  CREATININE 1.24*  CALCIUM 8.5*   CBG (last 3)  No results for input(s): GLUCAP in the last 72 hours.  Wt Readings from Last 3 Encounters:  03/12/16 59.1 kg (130 lb 4.7 oz)  02/27/16 66.5 kg (146 lb 9.7 oz)  10/25/14 67.6 kg (149 lb)    Physical Exam:  BP 129/60   Pulse 70   Temp 98.7 F (37.1 C) (Oral)   Resp 18   Ht 5\' 7"  (1.702 m)   Wt 59.1 kg (130 lb 4.7 oz)   SpO2 95%   BMI 20.41 kg/m  Constitutional: She appears well-developed and well-nourished.  HENT: Normocephalic and atraumatic.  Eyes: EOMI. No discharge.  Cardiovascular: RRR. No JVD. Respiratory: Clear. unlabored. GI: Soft. Bowel sounds are normal.  Musculoskeletal: She exhibits no edema. No tenderness.  Neurological: She is alert.  Right facial weakness with dysarthria Able to follow basic commands Motor: RUE 0/5 prox to distal (stable) RLE: 2-/5 HF, 2-/5 KE, 2-/5 ADF/DP.  mAS: 1+/4 elbow flexors, wrist extensors, knee flexors, 1+/4 ADF  Skin: Skin is warm and dry. Intact. Psychiatric: Flat affect.   Assessment/Plan: 1. Functional deficits secondary to left corona radiata,putamen,caudate infarct which require 3+ hours per day of interdisciplinary therapy in a comprehensive inpatient rehab setting. Physiatrist is providing close team supervision and  24 hour management of active medical problems listed below. Physiatrist and rehab team continue to assess barriers to discharge/monitor patient progress toward functional and medical goals.  Function:  Bathing Bathing position   Position: Shower  Bathing parts Body parts bathed by patient: Right arm, Chest, Abdomen, Front perineal area, Right upper leg, Right lower leg, Left upper leg Body parts bathed by helper: Left arm, Buttocks, Back  Bathing assist Assist Level: More than reasonable time, Touching or steadying assistance(Pt > 75%)      Upper Body Dressing/Undressing Upper body dressing   What is the patient wearing?: Pull over shirt/dress     Pull over shirt/dress - Perfomed by patient: Thread/unthread left sleeve, Pull shirt over trunk Pull over shirt/dress - Perfomed by helper: Thread/unthread right sleeve, Put head through opening        Upper body assist Assist Level: Touching or steadying assistance(Pt > 75%)      Lower Body Dressing/Undressing Lower body dressing Lower body dressing/undressing activity did not occur: N/A What is the patient wearing?: Pants     Pants- Performed by patient: Thread/unthread left pants leg Pants- Performed by helper: Thread/unthread right pants leg, Thread/unthread left pants leg, Pull pants up/down   Non-skid slipper socks- Performed by helper: Don/doff right sock, Don/doff left sock       Shoes - Performed by helper: Don/doff right shoe, Don/doff left shoe, Fasten right, Fasten left  Lower body assist Assist for lower body dressing:  (total A)      Toileting Toileting Toileting activity did not occur: Refused Toileting steps completed by patient: Adjust clothing after toileting, Adjust clothing prior to toileting Toileting steps completed by helper: Adjust clothing prior to toileting, Performs perineal hygiene, Adjust clothing after toileting Toileting Assistive Devices: Grab bar or rail  Toileting assist Assist  level: Touching or steadying assistance (Pt.75%)   Transfers Chair/bed transfer Chair/bed transfer activity did not occur: N/A Chair/bed transfer method: Squat pivot Chair/bed transfer assist level: Maximal assist (Pt 25 - 49%/lift and lower) Chair/bed transfer assistive device: Sliding board, Armrests Mechanical lift: Stedy   Locomotion Ambulation     Max distance: 15' Assist level: 2 helpers (+2 for w/c follow)   Wheelchair   Type: Manual Max wheelchair distance: 75 Assist Level: Supervision or verbal cues  Cognition Comprehension Comprehension assist level: Understands basic 90% of the time/cues < 10% of the time  Expression Expression assist level: Expresses basic 50 - 74% of the time/requires cueing 25 - 49% of the time. Needs to repeat parts of sentences.  Social Interaction Social Interaction assist level: Interacts appropriately 75 - 89% of the time - Needs redirection for appropriate language or to initiate interaction.  Problem Solving Problem solving assist level: Solves basic 25 - 49% of the time - needs direction more than half the time to initiate, plan or complete simple activities  Memory Memory assist level: Recognizes or recalls 25 - 49% of the time/requires cueing 50 - 75% of the time    Medical Problem List and Plan: 1. Right hemiparesis and dysarthria/dysphagia secondary to left corona radiata,putamen,caudate infarct on 1/9  Cont CIR therapies 2. DVT Prophylaxis/Anticoagulation: Pharmaceutical: Lovenox  3. Pain Management: tylenol prn   Robaxin 500 TID PRN   Neurontin started 1/23  4. Mood:   Has history of anxiety--appears controlled at present  Started prozac to help with mood and recovery.   LCSW to follow for evaluation and support.  5. Neuropsych: This patient is not fully capable of making decisions on her own behalf.  6. Skin/Wound Care: Routine pressure relief measures. Maintain adequate nutritional and hydration status.  7.  Fluids/Electrolytes/Nutrition: Monitor I/O. Supplements between meals.  8. HTN:  Norvasc increased to 10mg  on 1/15  Hydralazine  increased to 25 on 1/18   Cont lisinopril and metoprolol bid.   Overall controlled 1/30 9. Dysphagia: Continue dysphagia 1, honey liquids. Offer fluids between meals to maintain adequate hydration.  10. Dyslipidemia: on lipitor  11. Prediabetes: Hgb A1c- 6.0.   Modified diet to HH/CM. RD to educate patient and family on appropriate diet.   CBGs Controlled 1/30 12. GERD: Managed On Protonix.  13. Chronic constipation: Uses softners as well as suppository daily.   Started senna S at supper followed by suppository in am.  14. Right spastic hemiparesis:   Baclofen 5 TID.   Resting hand splint and R-AFO, discussed with nursing bracing at night.  ROM with therapy.   continue stretching,positioning    Will likely need Botox as outpt 15. Hypokalemia: Resolved  K+ 4.5 on 1/29   Mg+ 2.1 on 1/19  IVF with K+ qHS 16. Hypoalbuminemia  Supplement initiated 1/13 17. CKD III  With AKI, Cr. 1.24 on 1/29  IVF qHS started 1/16---continue while on honey liq, changed to NS. Will plan to d/c IVF tomorrow  Labs ordered for tomorrow  Cont to monitor 18. Leukocytosis- from UTI  WBCs 11.2  Afebrile  Labs  ordered for tomorrow  CXR reviewed, unremarkable for infectious process, small left pleural effusion 19. Hyponatremia:   Na+ 134 1/29  Labs ordered for tomorrow  Cont to monitor 20. Acute lower UTI  UA+, Ucx >Morganella and [place]  Bactrim started on 1/25-2/1  Labs ordered for tomorrow    LOS (Days) 18 A FACE TO FACE EVALUATION WAS PERFORMED  Ruhi Kopke Karis Juba 03/18/2016 8:35 AM

## 2016-03-18 NOTE — Progress Notes (Signed)
Occupational Therapy Session Note  Patient Details  Name: Christina Bradshaw MRN: [id number] Date of Birth: 1925/02/16  Today's Date: 03/18/2016 OT Individual Time: 1300-1358 OT Individual Time Calculation (min): 58 min   Short Term Goals: Week 1:  OT Short Term Goal 1 (Week 1): Pt will complete sit > stand with max assist to complete LB dressing/hygiene OT Short Term Goal 1 - Progress (Week 1): Progressing toward goal OT Short Term Goal 2 (Week 1): Pt will complete toilet transfers with max assist 3 out of 4 attempts OT Short Term Goal 2 - Progress (Week 1): Not met OT Short Term Goal 3 (Week 1): Pt will don pants with max assist of one caregiver OT Short Term Goal 3 - Progress (Week 1): Progressing toward goal OT Short Term Goal 4 (Week 1): Pt will position RUE during self-care tasks with min cues to increase awareness of positioning and decrease injury to shoulder OT Short Term Goal 4 - Progress (Week 1): Not met   Week 2:  OT Short Term Goal 1 (Week 2): Pt will complete LB dressing with Max A (1 helper) OT Short Term Goal 2 (Week 2): Pt will complete bathing with Min A and AE PRN OT Short Term Goal 3 (Week 2): Pt will complete toilet transfers with Max A and LRAD  OT Short Term Goal 4 (Week 2): Pt will utilize R UE as stabilizer for grooming tasks with min cues   Skilled Therapeutic Interventions/Progress Updates:  Pain: 5/10 to right knee, pt stated she recently received pain meds. Monitored pain during session.   Upon entering room, pt found supine in bed asleep with son present at bed side. Pt easy to awake and arouse. Pt engaged in bed mobility with max assist, needing assistance with rolling and supine to sit. Pt oriented X4. Pt sat EOB to perform below described exercises with therapist. Pt took breaks as needed. Therapist educated pt and son on moving techniques and importance of no one pulling on patient's right arm. Also educated both on decreased sensation and pt using eyes  and left arm to position arm prn to decrease likeliness of arm getting caught in devices, scraped, or hurt. After exercises and education, pt worked on [place] towards [facility]. Pt then performed sit to supine with mod assist, needing assistance with BLEs and some assistance with trunk. Therapist positioned pt onto her right side for positioning and comfort with pillows placed appropriately and prn. At end of session, left pt side lying in bed with all needs within reach and son present at bedside.   Therapy Documentation Precautions:  Precautions Precautions: Fall Precaution Comments: dense Rt hemiplegia Restrictions Weight Bearing Restrictions: No  Exercises: General Exercises - Upper Extremity Shoulder Flexion: AAROM;15 reps;Seated;Right (only to ~80* of flexion) Shoulder Extension: AAROM;15 reps;Seated;Right Elbow Flexion: AAROM;Seated;15 reps;Right Elbow Extension: AAROM;15 reps;Seated;Right Wrist Flexion: AAROM;15 reps;Seated;Right Wrist Extension: AAROM;Right;15 reps;Seated Digit Composite Flexion: AAROM;15 reps;Right;Seated Composite Extension: AAROM;15 reps;Seated;Right Shoulder Exercises Shoulder External Rotation: AAROM;Right;15 reps;Seated Hand Exercises Forearm Supination: AAROM;Right;15 reps;Seated Forearm Pronation: AAROM;Right;15 reps;Seated Thumb Abduction: AAROM;Right;15 reps;Seated Thumb Adduction: AAROM;Right;15 reps;Seated Other Exercises Other Exercises: Seated EOB, core exercises (forward, left, right, back) x15 each way  See Function Navigator for Current Functional Status.  Therapy/Group: Individual Therapy  Chrys Racer , MS, OTR/L, CLT  03/18/2016, 4:07 PM

## 2016-03-18 NOTE — Progress Notes (Signed)
Physical Therapy Session Note  Patient Details  Name: Christina Bradshaw MRN: [id number] Date of Birth: 1924-12-19  Today's Date: 03/18/2016 PT Individual Time: 0800-0845 PT Individual Time Calculation (min): 45 min   Short Term Goals: Week 3:  PT Short Term Goal 1 (Week 3): Pt will be able to perform basic transfers with mod assist PT Short Term Goal 2 (Week 3): Pt will be able to perform sit <> stands with mod assist PT Short Term Goal 3 (Week 3): Pt will be able to gait x 25' with mod assist PT Short Term Goal 4 (Week 3): Family/caregivers will begin hands-on family education  Skilled Therapeutic Interventions/Progress Updates: Pt presented in bed completing breakfast. Rolling L/R mod/maxA for changing brief and donning pants. Supine to sit mod/maxA for LE placement sequencing and truncal support. Use of bed rails and HOB elevated. Pt able to sit unsupported x4 min while donning shirt requiring mod/maxA for threading sleeves. Performed SB transfer to R bed to w/c with min/modA, mod vc's for sequencing. Pt propelled x76f with minA with cues for advoiding objects on R. Pt returned to room and in w/c with call bell within reach, and half lap tray on, with all current needs met.      Therapy Documentation Precautions:  Precautions Precautions: Fall Precaution Comments: dense Rt hemiplegia Restrictions Weight Bearing Restrictions: No   See Function Navigator for Current Functional Status.   Therapy/Group: Individual Therapy  [name]  Niquan Charnley, PTA  03/18/2016, 9:22 AM

## 2016-03-18 NOTE — Plan of Care (Signed)
Problem: RH BLADDER ELIMINATION Goal: RH STG MANAGE BLADDER WITH ASSISTANCE STG Manage Bladder With mod Assistance   Outcome: Not Progressing Pt requires Total A

## 2016-03-18 NOTE — Progress Notes (Signed)
Speech Language Pathology Daily Session Note  Patient Details  Name: Christina Bradshaw MRN: [id number] Date of Birth: Aug 02, 1924  Today's Date: 03/18/2016 SLP Individual Time: 1100-1200 SLP Individual Time Calculation (min): 60 min  Short Term Goals: Week 3: SLP Short Term Goal 1 (Week 3): Pt will consume dys 1 textures and nectar thick liquids with supervision verbal cues for use of swallowing precautions.  SLP Short Term Goal 2 (Week 3): Pt will consume therapeutic trials of thin liquids with minimal overt s/s of aspiration over 2 consecutive sessions to demonstrate readiness for repeat MBS.  SLP Short Term Goal 3 (Week 3): Patient will utilize speech intelligibility strategies at the phrase level with Min A multimodal cues.  SLP Short Term Goal 4 (Week 3): Patient will utilize word-finding strateiges during functional tasks/conversations with supervision verbal cues.  SLP Short Term Goal 5 (Week 3): Patient will demonstrate functional problem solving for basic and familiar tasks with Mod A multimodal cues.  SLP Short Term Goal 6 (Week 3): Patient will recall new, daily information with Mod A verbal cues.   Skilled Therapeutic Interventions: Skilled treatment session on dysphagia and speech goals. SLP facilitated session by providing skilled observation of trials of ice chips, thin liquids then dysphagia 2 lunch tray with nectar thick liquids. Pt consumed ice chips and thin liquids via cup without overt s/s of aspiration. Pt with top and bottom dentures in place without any c/o pain. Pt with decreased bolus manipulation of dysphagia 2 textures resulting in increased oral prep times of > 1 to 2 minutes and removal of all minced meat bolus from mouth. Recommend continuing dysphagia 1 with nectar thick at this time. SLP further facilitated sesson by providing Mod A verbal cues for ~75% intelligibility at the phrase level in a mildly nosey environment. Pt was returned to room and left upright in  wheelchair with all needs within reach. Continue current plan of care.      Function:  Eating Eating   Modified Consistency Diet: Yes Eating Assist Level: Supervision or verbal cues;Set up assist for   Eating Set Up Assist For: Opening containers       Cognition Comprehension Comprehension assist level: Understands basic 90% of the time/cues < 10% of the time  Expression   Expression assist level: Expresses basic 50 - 74% of the time/requires cueing 25 - 49% of the time. Needs to repeat parts of sentences.  Social Interaction Social Interaction assist level: Interacts appropriately 75 - 89% of the time - Needs redirection for appropriate language or to initiate interaction.  Problem Solving Problem solving assist level: Solves basic 50 - 74% of the time/requires cueing 25 - 49% of the time  Memory Memory assist level: Recognizes or recalls 50 - 74% of the time/requires cueing 25 - 49% of the time    Pain    Therapy/Group: Individual Therapy Christina Bradshaw, M.S., Christina Bradshaw  Christina Bradshaw 03/18/2016, 12:11 PM

## 2016-03-19 ENCOUNTER — Inpatient Hospital Stay (HOSPITAL_COMMUNITY): Payer: Medicare Other | Admitting: Occupational Therapy

## 2016-03-19 ENCOUNTER — Ambulatory Visit (HOSPITAL_COMMUNITY): Payer: Medicare Other | Admitting: Psychology

## 2016-03-19 ENCOUNTER — Inpatient Hospital Stay (HOSPITAL_COMMUNITY): Payer: Medicare Other

## 2016-03-19 ENCOUNTER — Inpatient Hospital Stay (HOSPITAL_COMMUNITY): Payer: Medicare Other | Admitting: Speech Pathology

## 2016-03-19 LAB — BASIC METABOLIC PANEL
ANION GAP: 6 (ref 5–15)
BUN: 26 mg/dL — ABNORMAL HIGH (ref 6–20)
CHLORIDE: 108 mmol/L (ref 101–111)
CO2: 22 mmol/L (ref 22–32)
Calcium: 8.4 mg/dL — ABNORMAL LOW (ref 8.9–10.3)
Creatinine, Ser: 1.04 mg/dL — ABNORMAL HIGH (ref 0.44–1.00)
GFR calc Af Amer: 54 mL/min — ABNORMAL LOW (ref >60)
GFR calc non Af Amer: 46 mL/min — ABNORMAL LOW (ref >60)
GLUCOSE: 111 mg/dL — AB (ref 65–99)
POTASSIUM: 4.6 mmol/L (ref 3.5–5.1)
Sodium: 136 mmol/L (ref 135–145)

## 2016-03-19 LAB — CBC WITH DIFFERENTIAL/PLATELET
BASOS ABS: 0.1 10*3/uL (ref 0.0–0.1)
Basophils Relative: 1 %
Eosinophils Absolute: 0.4 10*3/uL (ref 0.0–0.7)
Eosinophils Relative: 5 %
HEMATOCRIT: 37.2 % (ref 36.0–46.0)
Hemoglobin: 12.1 g/dL (ref 12.0–15.0)
LYMPHS ABS: 1.7 10*3/uL (ref 0.7–4.0)
LYMPHS PCT: 19 %
MCH: 30.7 pg (ref 26.0–34.0)
MCHC: 32.5 g/dL (ref 30.0–36.0)
MCV: 94.4 fL (ref 78.0–100.0)
MONO ABS: 0.7 10*3/uL (ref 0.1–1.0)
MONOS PCT: 7 %
NEUTROS ABS: 6.1 10*3/uL (ref 1.7–7.7)
Neutrophils Relative %: 68 %
Platelets: 235 10*3/uL (ref 150–400)
RBC: 3.94 MIL/uL (ref 3.87–5.11)
RDW: 14.5 % (ref 11.5–15.5)
WBC: 8.9 10*3/uL (ref 4.0–10.5)

## 2016-03-19 MED ORDER — SENNA 8.6 MG PO TABS: 1.0000 | ORAL_TABLET | Freq: Every day | ORAL | Status: DC | PRN

## 2016-03-19 MED ORDER — SENNA 8.6 MG PO TABS: 1.0000 | ORAL_TABLET | Freq: Every day | ORAL | Status: DC

## 2016-03-19 MED ORDER — BISACODYL 10 MG RE SUPP
10.0000 mg | RECTAL | Status: DC
  Administered 2016-03-19 – 2016-03-25 (×3): 10 mg via RECTAL
  Filled 2016-03-19 (×3): qty 1

## 2016-03-19 NOTE — Progress Notes (Signed)
Physical Therapy Session Note  Patient Details  Name: Christina LungMildred C Brougham MRN: [id number] Date of Birth: [birth year]  Today's Date: 03/19/2016 PT Individual Time: 0910-1005 PT Individual Time Calculation (min): 55 min   Short Term Goals: Week 3:  PT Short Term Goal 1 (Week 3): Pt will be able to perform basic transfers with mod assist PT Short Term Goal 2 (Week 3): Pt will be able to perform sit <> stands with mod assist PT Short Term Goal 3 (Week 3): Pt will be able to gait x 25' with mod assist PT Short Term Goal 4 (Week 3): Family/caregivers will begin hands-on family education  Skilled Therapeutic Interventions/Progress Updates: Pt presented in w/c agreeable to therapy. Donned shoes total assist for time management. Pt propelled w/c x3175ft with mod cues for R field and negotiating objects. Gait training 3925ft with x1 seated rest using wall rail. PTA providing total assist for advancing RLE. Cues for erect posture, wt shifting to R.  Performed SB transfer w/c to to/from bed with PTA placing SB and pt performing with mod/maxA to L, min/modA to R.  Continued w/c mobility as pt stating too fatigued to attempt add'l SB transfers. Pt propelled 23400ft with intermittent rest breaks with min/mod cues for negotiating on R. Pt returned to room and left in w/c with half lap tray in place and call bell within reach.      Therapy Documentation Precautions:  Precautions Precautions: Fall Precaution Comments: dense Rt hemiplegia Restrictions Weight Bearing Restrictions: No   See Function Navigator for Current Functional Status.   Therapy/Group: Individual Therapy  Bert Givans  Cha Gomillion, PTA  03/19/2016, 12:37 PM

## 2016-03-19 NOTE — Plan of Care (Signed)
Problem: RH Balance Goal: LTG: Patient will maintain dynamic sitting balance (OT) LTG:  Patient will maintain dynamic sitting balance with assistance during activities of daily living (OT)  Downgraded secondary to safety Goal: LTG Patient will maintain dynamic standing with ADLs (OT) LTG:  Patient will maintain dynamic standing balance with assist during activities of daily living (OT)   Downgraded due to lack of progress and safety  Problem: RH Dressing Goal: LTG Patient will perform lower body dressing w/assist (OT) LTG: Patient will perform lower body dressing with assist, with/without cues in positioning using equipment (OT)  Downgraded secondary to lack of progress  Problem: RH Toileting Goal: LTG Patient will perform toileting w/assist, cues/equip (OT) LTG: Patient will perform toiletiing (clothes management/hygiene) with assist, with/without cues using equipment (OT)  Downgraded due to lack of progress  Problem: RH Toilet Transfers Goal: LTG Patient will perform toilet transfers w/assist (OT) LTG: Patient will perform toilet transfers with assist, with/without cues using equipment (OT)  Downgraded secondary to lack of progress and safety concerns  Problem: RH Tub/Shower Transfers Goal: LTG Patient will perform tub/shower transfers w/assist (OT) LTG: Patient will perform tub/shower transfers with assist, with/without cues using equipment (OT)  Downgraded secondary to lack of progress and safety

## 2016-03-19 NOTE — Progress Notes (Signed)
Occupational Therapy Weekly Progress Note  Patient Details  Name: Christina Bradshaw MRN: [id number] Date of Birth: 11-13-1924  Beginning of progress report period: March 10, 2016 End of progress report period: March 19, 2016  Today's Date: 03/19/2016 OT Individual Time: 0702-0758 OT Individual Time Calculation (min): 56 min    Patient has met 2 of 4 short term goals. Pt making slow progress this week towards occupational therapy goals. Pt is inconsistent to functional transfer levels depending on level of fatigue. Pt continues to have 0/5 movement/strength in R UE. Pt needing total cues to attend to R UE for safety. Bathing and dressing is performed with sit <>stand at sink or EOB.   Patient continues to demonstrate the following deficits: muscle weakness, decreased cardiorespiratoy endurance, abnormal tone, decreased coordination and decreased motor planning, decreased initiation, decreased attention, decreased awareness, decreased problem solving, decreased safety awareness, decreased memory and delayed processing and decreased sitting balance, decreased standing balance, decreased postural control, hemiplegia and decreased balance strategies and therefore will continue to benefit from skilled OT intervention to enhance overall performance with BADL.  Patient not progressing toward long term goals.  See goal revision..  Plan of care revisions: Goals downgraded to overall mod A secondary to safety concerns and lack of progress.  OT Short Term Goals Week 2:  OT Short Term Goal 1 (Week 2): Pt will complete LB dressing with Max A (1 helper) OT Short Term Goal 1 - Progress (Week 2): Met OT Short Term Goal 2 (Week 2): Pt will complete bathing with Min A and AE PRN OT Short Term Goal 2 - Progress (Week 2): Progressing toward goal OT Short Term Goal 3 (Week 2): Pt will complete toilet transfers with Max A and LRAD  OT Short Term Goal 3 - Progress (Week 2): Met OT Short Term Goal 4 (Week 2):  Pt will utilize R UE as stabilizer for grooming tasks with min cues  OT Short Term Goal 4 - Progress (Week 2): Progressing toward goal Week 3:  OT Short Term Goal 1 (Week 3): STGs=LTGs secondary to upcoming discharge  Skilled Therapeutic Interventions/Progress Updates:    Upon entering the room, pt supine in bed with no c/o pain but finding it difficult to wake up this session. BP taken with results being 116/60 and HR is 60 in supine. Pt performed supine >sit with mod A to EOB for trunk and R LE. Pt declined bathing tasks and requested to change clothing. Dynamic sitting balance on EOB with min A this session with focus on hemiplegic dressing techniques. Pt unable to correctly state which side of the body to dress first. Pt attempting to cross ankle to opposite knee with assist from therapist to don pants onto feet. Sit <>stand with max A and max A for balance as therapist pulled pants over B hips in standing. Stand pivot transfer with max A from bed >wheelchair for lifting and lowering assistance. Pt seated in wheelchair with call bell and all needed items within reach upon exiting the room.   Therapy Documentation Precautions:  Precautions Precautions: Fall Precaution Comments: dense Rt hemiplegia Restrictions Weight Bearing Restrictions: No Vital Signs: Therapy Vitals BP: (!) 148/57  See Function Navigator for Current Functional Status.   Therapy/Group: Individual Therapy  Gypsy Decant 03/19/2016, 9:30 AM

## 2016-03-19 NOTE — Progress Notes (Signed)
[place] PHYSICAL MEDICINE & REHABILITATION     PROGRESS NOTE  Subjective/Complaints:  Pt seen sitting up in her chair this AM.  She slept well overnight.  She notes that she had some anxiety a few days ago after waking up, but is better this AM.    ROS: Denies nausea, vomiting, diarrhea, shortness of breath or chest pain.  Objective: Vital Signs: Blood pressure (!) 148/57, pulse 76, temperature 98.6 F (37 C), temperature source Oral, resp. rate 18, height 5\' 7"  (1.702 m), weight 59.1 kg (130 lb 4.7 oz), SpO2 92 %. No results found.  Recent Labs  03/19/16 0513  WBC 8.9  HGB 12.1  HCT 37.2  PLT 235    Recent Labs  03/17/16 0233 03/19/16 0513  NA 134* 136  K 4.5 4.6  CL 106 108  GLUCOSE 101* 111*  BUN 34* 26*  CREATININE 1.24* 1.04*  CALCIUM 8.5* 8.4*   CBG (last 3)  No results for input(s): GLUCAP in the last 72 hours.  Wt Readings from Last 3 Encounters:  03/12/16 59.1 kg (130 lb 4.7 oz)  02/27/16 66.5 kg (146 lb 9.7 oz)  10/25/14 67.6 kg (149 lb)    Physical Exam:  BP (!) 148/57 (BP Location: Left Arm)   Pulse 76   Temp 98.6 F (37 C) (Oral)   Resp 18   Ht 5\' 7"  (1.702 m)   Wt 59.1 kg (130 lb 4.7 oz)   SpO2 92%   BMI 20.41 kg/m  Constitutional: She appears well-developed and well-nourished.  HENT: Normocephalic and atraumatic.  Eyes: EOMI. No discharge.  Cardiovascular: RRR. No JVD. Respiratory: Clear. unlabored. GI: Soft. Bowel sounds are normal.  Musculoskeletal: She exhibits no edema. No tenderness.  Neurological: She is alert.  Right facial weakness with dysarthria Able to follow basic commands Motor: RUE 0/5 prox to distal (unchanged) RLE: 2-/5 HF, 2-/5 KE, 1+/5 ADF/DP.  mAS: 1/4 elbow flexors, wrist extensors, knee flexors, 1/4 ADF  Skin: Skin is warm and dry. Intact. Psychiatric: Flat affect.   Assessment/Plan: 1. Functional deficits secondary to left corona radiata,putamen,caudate infarct which require 3+ hours per day of  interdisciplinary therapy in a comprehensive inpatient rehab setting. Physiatrist is providing close team supervision and 24 hour management of active medical problems listed below. Physiatrist and rehab team continue to assess barriers to discharge/monitor patient progress toward functional and medical goals.  Function:  Bathing Bathing position   Position: Shower  Bathing parts Body parts bathed by patient: Right arm, Chest, Abdomen, Front perineal area, Right upper leg, Right lower leg, Left upper leg Body parts bathed by helper: Left arm, Buttocks, Back  Bathing assist Assist Level: More than reasonable time, Touching or steadying assistance(Pt > 75%)      Upper Body Dressing/Undressing Upper body dressing   What is the patient wearing?: Pull over shirt/dress     Pull over shirt/dress - Perfomed by patient: Thread/unthread left sleeve, Pull shirt over trunk Pull over shirt/dress - Perfomed by helper: Thread/unthread right sleeve, Put head through opening        Upper body assist Assist Level: Touching or steadying assistance(Pt > 75%)      Lower Body Dressing/Undressing Lower body dressing Lower body dressing/undressing activity did not occur: N/A What is the patient wearing?: Pants     Pants- Performed by patient: Thread/unthread left pants leg Pants- Performed by helper: Thread/unthread right pants leg, Thread/unthread left pants leg, Pull pants up/down   Non-skid slipper socks- Performed by helper: Don/doff right  sock, Don/doff left sock       Shoes - Performed by helper: Don/doff right shoe, Don/doff left shoe, Fasten right, Fasten left          Lower body assist Assist for lower body dressing:  (total A)      Toileting Toileting Toileting activity did not occur: Refused Toileting steps completed by patient: Adjust clothing after toileting, Adjust clothing prior to toileting Toileting steps completed by helper: Adjust clothing prior to toileting, Performs  perineal hygiene, Adjust clothing after toileting Toileting Assistive Devices: Grab bar or rail  Toileting assist Assist level: Touching or steadying assistance (Pt.75%)   Transfers Chair/bed transfer Chair/bed transfer activity did not occur: N/A Chair/bed transfer method: Lateral scoot Chair/bed transfer assist level: Moderate assist (Pt 50 - 74%/lift or lower) Chair/bed transfer assistive device: Sliding board, Armrests Mechanical lift: Stedy   Locomotion Ambulation     Max distance: 15' Assist level: 2 helpers (+2 for w/c follow)   Wheelchair   Type: Manual Max wheelchair distance: 75 Assist Level: Supervision or verbal cues  Cognition Comprehension Comprehension assist level: Understands basic 90% of the time/cues < 10% of the time  Expression Expression assist level: Expresses basic 50 - 74% of the time/requires cueing 25 - 49% of the time. Needs to repeat parts of sentences.  Social Interaction Social Interaction assist level: Interacts appropriately 75 - 89% of the time - Needs redirection for appropriate language or to initiate interaction.  Problem Solving Problem solving assist level: Solves basic 50 - 74% of the time/requires cueing 25 - 49% of the time  Memory Memory assist level: Recognizes or recalls 50 - 74% of the time/requires cueing 25 - 49% of the time    Medical Problem List and Plan: 1. Right hemiparesis and dysarthria/dysphagia secondary to left corona radiata,putamen,caudate infarct on 1/9  Cont CIR therapies 2. DVT Prophylaxis/Anticoagulation: Pharmaceutical: Lovenox  3. Pain Management: tylenol prn   Robaxin 500 TID PRN   Neurontin started 1/23  4. Mood:   Has history of anxiety--appears controlled at present  Started prozac to help with mood and recovery.   LCSW to follow for evaluation and support.  5. Neuropsych: This patient is not fully capable of making decisions on her own behalf.  6. Skin/Wound Care: Routine pressure relief measures. Maintain  adequate nutritional and hydration status.  7. Fluids/Electrolytes/Nutrition: Monitor I/O. Supplements between meals.  8. HTN:  Norvasc increased to 10mg  on 1/15  Hydralazine  increased to 25 on 1/18   Cont lisinopril and metoprolol bid.   Overall controlled 1/31 9. Dysphagia: Dysphagia 1, advanced to nectar liquids. Offer fluids between meals to maintain adequate hydration.  10. Dyslipidemia: on lipitor  11. Prediabetes: Hgb A1c- 6.0.   Modified diet to HH/CM. RD to educate patient and family on appropriate diet.   CBGs Controlled 1/31 12. GERD: Managed On Protonix.  13. Chronic constipation: Uses softners as well as suppository daily.   Started senna S at supper followed by suppository in am.  14. Right spastic hemiparesis:   Baclofen 5 TID.   Resting hand splint and R-AFO, discussed with nursing bracing at night.  ROM with therapy.   continue stretching,positioning    Will likely need Botox as outpt 15. Hypokalemia: Resolved  K+ 4.6 on 1/31  Mg+ 2.1 on 1/19  IVF with K+ qHS, d/ced on 1/31 16. Hypoalbuminemia  Supplement initiated 1/13 17. CKD III  With AKI, Cr. 1.04 on 1/31  IVF qHS started 1/16,  changed to NS,  d/ced on 1/31.   Will order labs on Friday to monitor off IVF  Cont to monitor 18. Leukocytosis- Resolved  WBCs 8.9  Afebrile  CXR reviewed, unremarkable for infectious process, small left pleural effusion 19. Hyponatremia:   Na+ 136 1/31  Cont to monitor 20. Acute lower UTI  UA+, Ucx >Morganella and Klebseilla  Bactrim started on 1/25-2/1  LOS (Days) 19 A FACE TO FACE EVALUATION WAS PERFORMED  Christina Bradshaw 03/19/2016 8:28 AM

## 2016-03-19 NOTE — Progress Notes (Signed)
Occupational Therapy Session Note  Patient Details  Name: Christina Bradshaw MRN: [id number] Date of Birth: [birth year]  Today's Date: 03/19/2016 OT Individual Time: 1032-1100 OT Individual Time Calculation (min): 28 min    Short Term Goals: Week 2:  OT Short Term Goal 1 (Week 2): Pt will complete LB dressing with Max A (1 helper) OT Short Term Goal 2 (Week 2): Pt will complete bathing with Min A and AE PRN OT Short Term Goal 3 (Week 2): Pt will complete toilet transfers with Max A and LRAD  OT Short Term Goal 4 (Week 2): Pt will utilize R UE as stabilizer for grooming tasks with min cues   Skilled Therapeutic Interventions/Progress Updates:    Treatment session with focus on Rt attention and AAROM of RUE.  Pt received upright in w/c reporting fatigue but willing to engage in therapy session.  Engaged in visual scanning task with focus on increased attention to all visual fields.  Pt requiring mild increased time to locate objects in Lt visual field, still able to successfully complete task.  Engaged in RUE PROM/AAROM with focus on tolerance to Rt ROM, pt with limited shoulder flexion > 90 degrees however did not report any pain with movement.  Repositioned RUE into sling for support and left upright in w/c with all needs in reach.  Therapy Documentation Precautions:  Precautions Precautions: Fall Precaution Comments: dense Rt hemiplegia Restrictions Weight Bearing Restrictions: No General:   Vital Signs: Therapy Vitals BP: (!) 148/57 Pain: Pain Assessment Pain Assessment: No/denies pain  See Function Navigator for Current Functional Status.   Therapy/Group: Individual Therapy  Rosalio LoudHOXIE, Hyrum Shaneyfelt 03/19/2016, 11:19 AM

## 2016-03-19 NOTE — Progress Notes (Signed)
Speech Language Pathology Daily Session Note  Patient Details  Name: Christina Bradshaw MRN: [id number] Date of Birth: 1924-11-03  Today's Date: 03/19/2016 SLP Individual Time: 1505-1605 SLP Individual Time Calculation (min): 60 min  Short Term Goals: Week 3: SLP Short Term Goal 1 (Week 3): Pt will consume dys 1 textures and nectar thick liquids with supervision verbal cues for use of swallowing precautions.  SLP Short Term Goal 2 (Week 3): Pt will consume therapeutic trials of thin liquids with minimal overt s/s of aspiration over 2 consecutive sessions to demonstrate readiness for repeat MBS.  SLP Short Term Goal 3 (Week 3): Patient will utilize speech intelligibility strategies at the phrase level with Min A multimodal cues.  SLP Short Term Goal 4 (Week 3): Patient will utilize word-finding strateiges during functional tasks/conversations with supervision verbal cues.  SLP Short Term Goal 5 (Week 3): Patient will demonstrate functional problem solving for basic and familiar tasks with Mod A multimodal cues.  SLP Short Term Goal 6 (Week 3): Patient will recall new, daily information with Mod A verbal cues.   Skilled Therapeutic Interventions: Skilled treatment session focused on dysphagia and speech intelligibility goals. SLP facilitated session by providing skilled observation during consumption of ice chips and thin liquids via spoon and cup sips. Pt without any overt s/s of aspiration during consumption. SLP further facilitated session by providing Min A verbal cues to utilize word-finding strategies during simple conversation and supervision cues for use of speech intelligibility strategies to achieve ~75% intelligibility at the phrase level. Pt was left upright in bed with all needs within reach. Continue per current plan of care.      Function:  Eating Eating   Modified Consistency Diet:  (Trials of thin liquids with SLP only) Eating Assist Level: Supervision or verbal cues   Eating  Set Up Assist For: Opening containers       Cognition Comprehension Comprehension assist level: Understands basic 90% of the time/cues < 10% of the time  Expression   Expression assist level: Expresses basic 50 - 74% of the time/requires cueing 25 - 49% of the time. Needs to repeat parts of sentences.  Social Interaction Social Interaction assist level: Interacts appropriately 75 - 89% of the time - Needs redirection for appropriate language or to initiate interaction.  Problem Solving Problem solving assist level: Solves basic 50 - 74% of the time/requires cueing 25 - 49% of the time  Memory Memory assist level: Recognizes or recalls 50 - 74% of the time/requires cueing 25 - 49% of the time    Pain Pain Assessment Pain Assessment: No/denies pain Pain Score: 2  Faces Pain Scale: No hurt Pain Location: Knee Pain Orientation: Right;Left Pain Intervention(s): Medication (See eMAR)  Therapy/Group: Individual Therapy  Genice Kimberlin B. [name], M.S., CCC-SLP Speech-Language Pathologist  Christina Bradshaw 03/19/2016, 4:04 PM

## 2016-03-19 NOTE — Progress Notes (Signed)
Neuropsychology Consult note  Confidential   Patient:  Christina Bradshaw   DOB: 20-Jul-1924  MR Number: [id number]  [facility] [address] [id number] [place] [id number] Dept: [phone] Loc: [phone]  Start: 1 PM End: 1:45 PM   Provider/Observer:     Hershal CoriaJohn R Rodenbough PSYD  Chief Complaint:      Chief Complaint  Patient presents with  . Depression  . Stress  . Memory Loss    Reason For Service:     The patient is an 83 year old Caucasian female that was referred for a psychological/the patient suffered a cerebral vascular accident. The patient had been fairly independent prior to this and living on [place] own [place]. The patient is now realizing that she is not going to be able to go back to her home and will need to be going to a nursing facility/care facility and is unsure about whether she will be able to go back to living independently again. This has caused a lot of stress for her and difficulties coping with this knee realization.  Interventions Strategy:  Cognitive Behavioral Interventions and Coping  Participation Level:   Active  Participation Quality:  Drowsy      Behavioral Observation:  Fairly Groomed, Lethargic, and Tearful.   Current Psychosocial Factors: The patient is having to cope with her situation and the significant changes the CVA has produced.  Content of Session:   Reviewed her current situation.  She appears to be aware of all the changes and very frustrated by her loss of fuction and having a difficult time dealing with her loss of independence.  Current Status:   The patient is upset and is having some adjustment reactions with depressive symptions.  Patient Progress:   She is starting to deal better with these changes/losses  Target Goals:   Build coping skills and adaption to the changes in her life  Last Reviewed:   03/19/2016  Goals  Addressed Today:    Worked on coping skills around adjusting to the physical changes after CVA.  Impression/Diagnosis:   The patient had bee doing very well for her age and living on her own and taking care of all her needs.  She has had a lot of stress and worry about the changes.  Adjustment disorder with depressive features.  Diagnosis:   Left sided cerebral hemisphere cerebrovascular accident (CVA) (HCC)  Adjustment disorder with depressed mood

## 2016-03-20 ENCOUNTER — Inpatient Hospital Stay (HOSPITAL_COMMUNITY): Payer: Medicare Other | Admitting: Occupational Therapy

## 2016-03-20 ENCOUNTER — Inpatient Hospital Stay (HOSPITAL_COMMUNITY): Payer: Medicare Other

## 2016-03-20 ENCOUNTER — Inpatient Hospital Stay (HOSPITAL_COMMUNITY): Payer: Medicare Other | Admitting: Speech Pathology

## 2016-03-20 ENCOUNTER — Inpatient Hospital Stay (HOSPITAL_COMMUNITY): Payer: Medicare Other | Admitting: Physical Therapy

## 2016-03-20 MED ORDER — FLUOXETINE HCL 20 MG PO CAPS
20.0000 mg | ORAL_CAPSULE | Freq: Every day | ORAL | Status: DC
  Administered 2016-03-21 – 2016-03-26 (×6): 20 mg via ORAL
  Filled 2016-03-20 (×6): qty 1

## 2016-03-20 NOTE — Progress Notes (Signed)
[place] PHYSICAL MEDICINE & REHABILITATION     PROGRESS NOTE  Subjective/Complaints:  Pt laying in bed this AM.  She slept well overnight.  She denies complaints this AM.  ROS: Denies nausea, vomiting, diarrhea, shortness of breath or chest pain.  Objective: Vital Signs: Blood pressure (!) 173/84, pulse 71, temperature 98.7 F (37.1 C), temperature source Oral, resp. rate 16, height 5\' 7"  (1.702 m), weight 59.1 kg (130 lb 4.7 oz), SpO2 97 %. No results found.  Recent Labs  03/19/16 0513  WBC 8.9  HGB 12.1  HCT 37.2  PLT 235    Recent Labs  03/19/16 0513  NA 136  K 4.6  CL 108  GLUCOSE 111*  BUN 26*  CREATININE 1.04*  CALCIUM 8.4*   CBG (last 3)  No results for input(s): GLUCAP in the last 72 hours.  Wt Readings from Last 3 Encounters:  03/12/16 59.1 kg (130 lb 4.7 oz)  02/27/16 66.5 kg (146 lb 9.7 oz)  10/25/14 67.6 kg (149 lb)    Physical Exam:  BP (!) 173/84 (BP Location: Left Arm)   Pulse 71   Temp 98.7 F (37.1 C) (Oral)   Resp 16   Ht 5\' 7"  (1.702 m)   Wt 59.1 kg (130 lb 4.7 oz)   SpO2 97%   BMI 20.41 kg/m  Constitutional: She appears well-developed and well-nourished.  HENT: Normocephalic and atraumatic.  Eyes: EOMI. No discharge.  Cardiovascular: RRR. No JVD. Respiratory: Clear. Unlabored. GI: Soft. Bowel sounds are normal.  Musculoskeletal: She exhibits no edema. No tenderness.  Neurological: She is alert.  Right facial weakness with dysarthria Able to follow basic commands Motor: RUE 0/5 prox to distal (stable) RLE: 2-/5 HF, 2-/5 KE, 1+/5 ADF/DP.  mAS: 1/4 elbow flexors, wrist extensors, knee flexors, 1/4 ADF  Skin: Skin is warm and dry. Intact. Psychiatric: Flat affect.   Assessment/Plan: 1. Functional deficits secondary to left corona radiata,putamen,caudate infarct which require 3+ hours per day of interdisciplinary therapy in a comprehensive inpatient rehab setting. Physiatrist is providing close team supervision and 24 hour  management of active medical problems listed below. Physiatrist and rehab team continue to assess barriers to discharge/monitor patient progress toward functional and medical goals.  Function:  Bathing Bathing position Bathing activity did not occur: Refused Position: Systems developerhower  Bathing parts Body parts bathed by patient: Right arm, Chest, Abdomen, Front perineal area, Right upper leg, Right lower leg, Left upper leg Body parts bathed by helper: Left arm, Buttocks, Back  Bathing assist Assist Level: More than reasonable time, Touching or steadying assistance(Pt > 75%)      Upper Body Dressing/Undressing Upper body dressing   What is the patient wearing?: Pull over shirt/dress     Pull over shirt/dress - Perfomed by patient: Thread/unthread left sleeve, Pull shirt over trunk Pull over shirt/dress - Perfomed by helper: Thread/unthread right sleeve, Put head through opening        Upper body assist Assist Level:  (mod A)      Lower Body Dressing/Undressing Lower body dressing Lower body dressing/undressing activity did not occur: N/A What is the patient wearing?: Pants     Pants- Performed by patient: Thread/unthread left pants leg Pants- Performed by helper: Thread/unthread right pants leg, Pull pants up/down   Non-skid slipper socks- Performed by helper: Don/doff right sock, Don/doff left sock       Shoes - Performed by helper: Don/doff right shoe, Don/doff left shoe, Fasten right, Fasten left  Lower body assist Assist for lower body dressing:  (max a)      Toileting Toileting Toileting activity did not occur: No continent bowel/bladder event Toileting steps completed by patient: Adjust clothing after toileting, Adjust clothing prior to toileting Toileting steps completed by helper: Adjust clothing prior to toileting, Performs perineal hygiene, Adjust clothing after toileting Toileting Assistive Devices: Grab bar or rail  Toileting assist Assist level: Touching  or steadying assistance (Pt.75%)   Transfers Chair/bed transfer Chair/bed transfer activity did not occur: N/A Chair/bed transfer method: Lateral scoot, Stand pivot Chair/bed transfer assist level: Maximal assist (Pt 25 - 49%/lift and lower) Chair/bed transfer assistive device: Armrests Mechanical lift: Landscape architect     Max distance: 15' Assist level: 2 helpers (+2 for w/c follow)   Wheelchair   Type: Manual Max wheelchair distance: 75 Assist Level: Supervision or verbal cues  Cognition Comprehension Comprehension assist level: Understands basic 90% of the time/cues < 10% of the time  Expression Expression assist level: Expresses basic 50 - 74% of the time/requires cueing 25 - 49% of the time. Needs to repeat parts of sentences.  Social Interaction Social Interaction assist level: Interacts appropriately 75 - 89% of the time - Needs redirection for appropriate language or to initiate interaction.  Problem Solving Problem solving assist level: Solves basic 50 - 74% of the time/requires cueing 25 - 49% of the time  Memory Memory assist level: Recognizes or recalls 50 - 74% of the time/requires cueing 25 - 49% of the time    Medical Problem List and Plan: 1. Right hemiparesis and dysarthria/dysphagia secondary to left corona radiata,putamen,caudate infarct on 1/9  Cont CIR therapies 2. DVT Prophylaxis/Anticoagulation: Pharmaceutical: Lovenox  3. Pain Management: tylenol prn   Robaxin 500 TID PRN   Neurontin started 1/23  4. Mood:   Has history of anxiety--appears controlled at present  Started prozac to help with mood and recovery.   LCSW to follow for evaluation and support.  5. Neuropsych: This patient is not fully capable of making decisions on her own behalf.  6. Skin/Wound Care: Routine pressure relief measures. Maintain adequate nutritional and hydration status.  7. Fluids/Electrolytes/Nutrition: Monitor I/O. Supplements between meals.  8. HTN:  Norvasc  increased to 10mg  on 1/15  Hydralazine  increased to 25 on 1/18   Cont lisinopril and metoprolol bid.   Elevated this AM, otherwise controlled, will cont to monitor 9. Dysphagia: Dysphagia 1, advanced to nectar liquids. Offer fluids between meals to maintain adequate hydration.  10. Dyslipidemia: on lipitor  11. Prediabetes: Hgb A1c- 6.0.   Modified diet to HH/CM. RD to educate patient and family on appropriate diet.   CBGs Controlled 2/1 12. GERD: Managed On Protonix.  13. Chronic constipation: Uses softners as well as suppository daily.   Started senna S at supper followed by suppository in am.  14. Right spastic hemiparesis:   Baclofen 5 TID.   Resting hand splint and R-AFO, discussed with nursing bracing at night.  ROM with therapy.   continue stretching,positioning    Will likely need Botox as outpt 15. Hypokalemia:   K+ 4.6 on 1/31  Mg+ 2.1 on 1/19  IVF with K+ qHS, d/ced on 1/31  Labs ordered for tomorrow to monitor off supplementation 16. Hypoalbuminemia  Supplement initiated 1/13 17. CKD III  With AKI, Cr. 1.04 on 1/31  IVF qHS started 1/16,  changed to NS, d/ced on 1/31.   Labs ordered for tomorrow to monitor off IVF  Cont to  monitor 18. Leukocytosis- Resolved  WBCs 8.9  Afebrile  CXR reviewed, unremarkable for infectious process, small left pleural effusion 19. Hyponatremia:   Na+ 136 1/31  Cont to monitor  Labs ordered for tomorrow 20. Acute lower UTI  UA+, Ucx >Morganella and Klebseilla  Bactrim started on 1/25-2/1  LOS (Days) 20 A FACE TO FACE EVALUATION WAS PERFORMED  Stokes Rattigan Karis Juba 03/20/2016 8:44 AM

## 2016-03-20 NOTE — Progress Notes (Signed)
Social Work Patient ID: Denita LungMildred C Masih, female   DOB: 1924-12-08, 83 y.o.   MRN: [id number]  Anselm PancoastLucy R Coston Mandato, LCSW Social Worker Signed   Patient Care Conference Date of Service: 03/20/2016 11:11 AM      Hide copied text Hover for attribution information Inpatient RehabilitationTeam Conference and Plan of Care Update Date: 03/18/2016   Time: 2:30 PM      Patient Name: Denita LungMildred C Santore      Medical Record Number: [id number]  Date of Birth: 1924-12-08 Sex: Female         Room/Bed: 4M01C/4M01C-01 Payor Info: Payor: MEDICARE / Plan: MEDICARE PART A AND B / Product Type: *No Product type* /     Admitting Diagnosis: l cva  Admit Date/Time:  02/29/2016  3:34 PM Admission Comments: No comment available    Primary Diagnosis:  Left sided cerebral hemisphere cerebrovascular accident (CVA) (HCC) Principal Problem: Left sided cerebral hemisphere cerebrovascular accident (CVA) [facility]HCC)       Patient Active Problem List    Diagnosis Date Noted  . Hyponatremia    . Lethargy    . Neuropathic pain    . Acute lower UTI    . Abnormal chest x-ray    . Hypernatremia    . Leukocytosis    . AKI (acute kidney injury) (HCC)    . Labile blood pressure    . Prediabetes    . Hypokalemia    . Hypoalbuminemia due to protein-calorie malnutrition (HCC)    . Stage 3 chronic kidney disease    . Left sided cerebral hemisphere cerebrovascular accident (CVA) (HCC) 02/29/2016  . Spastic hemiparesis of right dominant side due to cerebral infarction (HCC) 02/29/2016  . Dysphagia 02/29/2016  . Essential hypertension 02/29/2016  . TIA (transient ischemic attack) 02/27/2016  . CVA (cerebral vascular accident) (HCC) 02/27/2016      Expected Discharge Date: Expected Discharge Date:  (SNF)   Team Members Present: Physician leading conference: Dr. Maryla MorrowAnkit Patel Social Worker Present: Amada JupiterLucy Brook Geraci, LCSW Nurse Present: Kennyth ArnoldStacey Jennings, RN PT Present: Katherine Mantleodney Wishart, PT;Other (comment) (Rosita Dechalus, PTA) OT Present:  Callie FieldingKatie Pittman, OT SLP Present: Other (comment) Reuel Derby(Happi Overton, SLP) PPS Coordinator present : Tora DuckMarie Noel, RN, CRRN       Current Status/Progress Goal Weekly Team Focus  Medical     Right hemiparesis and dysarthria/dysphagia secondary to left corona radiata,putamen,caudate infarct on 1/9  Improve mobility, transfers, UTI, tone  See above   Bowel/Bladder     (P) Incontinent of bowel and bladder. LBM 03/20/16  (P) Managed bowel and bladder  (P) Continue with timed toileting q 2hrs   Swallow/Nutrition/ Hydration     No improvement with dysphagia 2 tray, improved s/s of airway protection with trials of ice chips/thin liquids  Supervision  Working towards repeat instrument swallow assessment to determine readiness for advancement to thin liquids   ADL's     mod/max A functional transfers, mod A UB self care, max - total A LB self care, R UE flaccid  min A sitting balance, UB self care and mod A overall  ADL retraining, functional mobility, balance, R UE NMR, R visual scanning, pt/family education   Mobility     mod/maxA bed mobilty, mod/maxA SB transfers,   MinA functional mobilty, gait 1850ft modA LRAD  Bed mobilty, w/c mobilty, SB transfers   Communication     Min to mod assist  Min Assist  continue to address increased vocal intensity, overtarticulation, slow rate, awareness and correction of verbal errors  Safety/Cognition/ Behavioral Observations   Mod A  Min assist  continue to address basic cognition   Pain     (P) Muscle rub to R shoulder, Tylenol 650mg  prn q 4hrs  (P) Monitor for nonverbal cues of pain  (P) <3   Skin     (P) Deep tissue injury to R heel. Blister to L heel with foam dressing to area   (P) No additional skin breakdown        *See Care Plan and progress notes for long and short-term goals.   Barriers to Discharge: Mobiliy, safety, transfers, UTI, spasticity, CKD, dysphagia     Possible Resolutions to Barriers:  Follow labs, IVF d/ced, therapies, antispasticity  meds, abx for UTI     Discharge Planning/Teaching Needs:  Son confirming this afternoon that family and pt are agreed on need to change d/c plan to SNF.  NA - plan for SNF   Team Discussion:  BP under better control.  Decreased spasticity.  Stopped IVF today and MD to monitor labs.  Concern that timed toileting is NOT being done - RN to follow up.  Most goals being downgraded due to slow progress.  Her participation, however, is improved.  ST would like ot repeat MBS at end of week.  SW reports d/c plan changed to SNF.  Revisions to Treatment Plan:  Several goals downgraded and d/c plan changed to SNF    Continued Need for Acute Rehabilitation Level of Care: The patient requires daily medical management by a physician with specialized training in physical medicine and rehabilitation for the following conditions: Daily direction of a multidisciplinary physical rehabilitation program to ensure safe treatment while eliciting the highest outcome that is of practical value to the patient.: Yes Daily medical management of patient stability for increased activity during participation in an intensive rehabilitation regime.: Yes Daily analysis of laboratory values and/or radiology reports with any subsequent need for medication adjustment of medical intervention for : Neurological problems;Blood pressure problems;Renal problems;Urological problems   Krystal Delduca 03/20/2016, 4:21 PM      Anselm Pancoast, LCSW Social Worker Signed   Patient Care Conference Date of Service: 03/12/2016  6:55 PM      Hide copied text Hover for attribution information Inpatient RehabilitationTeam Conference and Plan of Care Update Date: 03/12/2016   Time: 2:35 PM      Patient Name: TYSHEA IMEL      Medical Record Number: [id number]  Date of Birth: 17-Apr-1924 Sex: Female         Room/Bed: 4M01C/4M01C-01 Payor Info: Payor: MEDICARE / Plan: MEDICARE PART A AND B / Product Type: *No Product type* /     Admitting  Diagnosis: l cva  Admit Date/Time:  02/29/2016  3:34 PM Admission Comments: No comment available    Primary Diagnosis:  Left sided cerebral hemisphere cerebrovascular accident (CVA) (HCC) Principal Problem: Left sided cerebral hemisphere cerebrovascular accident (CVA) [facility])       Patient Active Problem List    Diagnosis Date Noted  . Neuropathic pain    . Acute lower UTI    . Abnormal chest x-ray    . Hypernatremia    . Leukocytosis    . AKI (acute kidney injury) (HCC)    . Labile blood pressure    . Prediabetes    . Hypokalemia    . Hypoalbuminemia due to protein-calorie malnutrition (HCC)    . Stage 3 chronic kidney disease    . Left sided cerebral hemisphere cerebrovascular accident (CVA) (  HCC) 02/29/2016  . Spastic hemiparesis of right dominant side due to cerebral infarction (HCC) 02/29/2016  . Dysphagia 02/29/2016  . Essential hypertension 02/29/2016  . TIA (transient ischemic attack) 02/27/2016  . CVA (cerebral vascular accident) (HCC) 02/27/2016      Expected Discharge Date: Expected Discharge Date: 03/26/16   Team Members Present: Physician leading conference: Dr. Maryla Morrow Social Worker Present: Amada Jupiter, LCSW Nurse Present: Carmie End, RN PT Present: Katherine Mantle, PT;Other (comment) (Rosita Dechalus, PTA) OT Present: Callie Fielding, OT SLP Present: Jackalyn Lombard, SLP PPS Coordinator present : Tora Duck, RN, CRRN       Current Status/Progress Goal Weekly Team Focus  Medical     Right hemiparesis and dysarthria/dysphagia secondary to left corona radiata,putamen,caudate infarct on 1/9  Improve mobility, safety, transfers, HTN, tone, UTI  See above   Bowel/Bladder     Incontinent of B/B. LBM 03/11/16  pt will decrease in number of incontinent events/accidents per shift  continue timed toileting Q2HR   Swallow/Nutrition/ Hydration     Improving mastication of dys 2 textures and improved s/s of airway protection with nectar thick liquids   supervision    continue working towards repeat objective study to determine readiness for advancement    ADL's     max A squat pivot, min- mod A UB, total A LB self care, incontient, R UE flaccid  min standing, min tub bench transfers, bathing, dressing, toileting  ADL retraining, functional mobility, balance, RUE NMR, R visual scanning.    Mobility     maxA squat pivot, modA SB transfer with max cues  MinA transfers and bed mobilty, gait 41ft LRAD  Bed mobility, Transfers, standing balance   Communication     min-mod assist   min assist-supevision   continue to address increased vocal intensity, overarticulation, slow rate, awareness and correction of verbal errors.    Safety/Cognition/ Behavioral Observations   mod assist   min assist   continue to address basic cognition    Pain     Muscle rub to R foot PRN; Tylenol 650mg  PRN Q4HR  <3  Monitor for effectiveness of PRN medication(s)   Skin     Deep tissue injury to outer aspect of R heel; foam dressing to area  Pt will be free of additional skin breakdown  monitor Q shift for changes in pt skin integrity; PRAFO boot on during sleep     Rehab Goals Patient on target to meet rehab goals: Yes *See Care Plan and progress notes for long and short-term goals.   Barriers to Discharge: Mobiliy, safety, transfers, HTN, UTI, spasticity     Possible Resolutions to Barriers:  Follow labs, IVF qHS, therapies, antispasticity meds, abx for UTI     Discharge Planning/Teaching Needs:  Pt to dc home with family to arrange 24/7 assistance -   Education to be planned   Team Discussion:  Nauseated today;  Adjusting BP meds and other meds.  Started abx for UTI (which could be cause of nausea).  Still with IVF qhs.  Still trying to be more vigilant about timed toileting.  Deep tissues injury to heel - PRAFO.  Currently mod assist - total with therapies but inconsistent.  Tx want to keep min assist goals for now to see how she recovers from UTI and nausea from abx.  Aim  to begin family ed next week.  Revisions to Treatment Plan:  None at this time.    Continued Need for Acute Rehabilitation Level of Care:  The patient requires daily medical management by a physician with specialized training in physical medicine and rehabilitation for the following conditions: Daily direction of a multidisciplinary physical rehabilitation program to ensure safe treatment while eliciting the highest outcome that is of practical value to the patient.: Yes Daily medical management of patient stability for increased activity during participation in an intensive rehabilitation regime.: Yes Daily analysis of laboratory values and/or radiology reports with any subsequent need for medication adjustment of medical intervention for : Neurological problems;Diabetes problems;Blood pressure problems;Renal problems;Urological problems   Guenther Dunshee 03/12/2016, 6:55 PM      Anselm Pancoast, LCSW Social Worker Signed   Patient Care Conference Date of Service: 03/06/2016 12:04 PM      Hide copied text Hover for attribution information Inpatient RehabilitationTeam Conference and Plan of Care Update Date: 03/05/2016   Time: 2:45 PM      Patient Name: MARLEENA SHUBERT      Medical Record Number: [id number]  Date of Birth: 05-30-1924 Sex: Female         Room/Bed: 4M01C/4M01C-01 Payor Info: Payor: MEDICARE / Plan: MEDICARE PART A AND B / Product Type: *No Product type* /     Admitting Diagnosis: l cva  Admit Date/Time:  02/29/2016  3:34 PM Admission Comments: No comment available    Primary Diagnosis:  Left sided cerebral hemisphere cerebrovascular accident (CVA) (HCC) Principal Problem: Left sided cerebral hemisphere cerebrovascular accident (CVA) [facility])       Patient Active Problem List    Diagnosis Date Noted  . Hypernatremia    . Leukocytosis    . AKI (acute kidney injury) (HCC)    . Labile blood pressure    . Prediabetes    . Hypokalemia    . Hypoalbuminemia due to  protein-calorie malnutrition (HCC)    . Stage 3 chronic kidney disease    . Left sided cerebral hemisphere cerebrovascular accident (CVA) (HCC) 02/29/2016  . Spastic hemiparesis of right dominant side due to cerebral infarction (HCC) 02/29/2016  . Dysphagia 02/29/2016  . Essential hypertension 02/29/2016  . TIA (transient ischemic attack) 02/27/2016  . CVA (cerebral vascular accident) (HCC) 02/27/2016      Expected Discharge Date: Expected Discharge Date: 03/26/16   Team Members Present: Physician leading conference: Dr. Maryla Morrow Social Worker Present: Amada Jupiter, LCSW Nurse Present: Kennon Portela, RN PT Present: Katherine Mantle, PT;Other (comment) (Rosita Dechalus, PTA) OT Present: Callie Fielding, OT SLP Present: Fae Pippin, SLP PPS Coordinator present : Edson Snowball, PT       Current Status/Progress Goal Weekly Team Focus  Medical     Right hemiparesis and dysarthria/dysphagia secondary to left corona radiata,putamen,caudate infarct on 1/9  Improve mobility, safety, transfers, comorbid conditions  See above   Bowel/Bladder     Incontnent of bowel and bladder. LBM 03/06/16  Managed bowel and bladder  Timed toileting q 2hrs   Swallow/Nutrition/ Hydration     Dys 1, honey thick liquids   supervision   toleration of current diet and trials of advanced textures   ADL's     mod A bathing, min A donning shirt, total A pants, incontinent, max A sit to stand and squat pivot transfers, RUE flaccid  min standing, min tub bench transfers, bathing, dressing, toileting  ADL retraining, functional mobility, balance, RUE management and NMR, R visual scanning   Mobility     maxA transfers, maxA gait 5-59ft with dependent placement of RLE  MinA transfers, and gait 25ft. Stair management x 5 with 1  rail  Bed mobilty, squat pivot transfers, pre-gait activities, Balance    Communication     mod assist   min assist-supervision    education and carryover of compensatory strategies     Safety/Cognition/ Behavioral Observations   mod-max assist   min assist   basic cognition    Pain     No c/o pain   <3  Monitor for nonverbal cues of pain   Skin     CDI  CDI  Assess q shift, and encourage to turn q2hrs     Rehab Goals Patient on target to meet rehab goals: Yes *See Care Plan and progress notes for long and short-term goals.   Barriers to Discharge: Mobiliy, safety, transfers, HTN, prediabetes, hypokalemia, hypernatremia, AKI, leukocytosis     Possible Resolutions to Barriers:  Follow labs, IVF qHS, K+ supplementation, therapies     Discharge Planning/Teaching Needs:  Pt to dc home with family to arrange 24/7 assistance -   TBD   Team Discussion:  BP meds being adjusted; IVF at night and watching labs closely. incont b/b and recommeding timed toiletin.  Pt with NO AWRENESS of b/b issues.  theapies all feel that min assist goals overall may be lofty.  Currently mod - total assistance.  Diet has been downgraded as well.  SW to follow up with family but may need SNF.  Revisions to Treatment Plan:  None but tx goals may be downgraded    Continued Need for Acute Rehabilitation Level of Care: The patient requires daily medical management by a physician with specialized training in physical medicine and rehabilitation for the following conditions: Daily direction of a multidisciplinary physical rehabilitation program to ensure safe treatment while eliciting the highest outcome that is of practical value to the patient.: Yes Daily medical management of patient stability for increased activity during participation in an intensive rehabilitation regime.: Yes Daily analysis of laboratory values and/or radiology reports with any subsequent need for medication adjustment of medical intervention for : Neurological problems;Diabetes problems;Blood pressure problems;Renal problems   Esmerelda Finnigan 03/07/2016, 9:21 AM

## 2016-03-20 NOTE — Progress Notes (Signed)
Social Work Patient ID: Christina Bradshaw, female   DOB: [birth year], 83 y.o.   MRN: [id number]   Have met with pt and son, Ronalee Belts, this week following team conference.  Both aware of slow progress and we discussed d/c planning concerns.  Son reports family simply cannot meet pt's current assistance needs and they and pt are agreed to change d/c plan to SNF.  Pt admits frustration with having to pursue SNF yet understands reasons for this.  Pt being followed by neuropsychology as well.  Chrishon Martino, LCSW

## 2016-03-20 NOTE — Plan of Care (Signed)
Problem: RH BOWEL ELIMINATION Goal: RH STG MANAGE BOWEL WITH ASSISTANCE STG Manage Bowel with mod Assistance.   Outcome: Not Progressing Total Assist

## 2016-03-20 NOTE — Progress Notes (Signed)
Occupational Therapy Session Note  Patient Details  Name: Christina Bradshaw MRN: [id number] Date of Birth: 1924/04/29  Today's Date: 03/20/2016 OT Individual Time: [id number] and 1300-1331 OT Individual Time Calculation (min): 47 min  And 31 min   Today's Date: 03/20/2016 OT Missed Time: 13 Minutes Missed Time Reason: Other (comment) (pt on bed pan)   Short Term Goals: Week 3:  OT Short Term Goal 1 (Week 3): STGs=LTGs secondary to upcoming discharge  Skilled Therapeutic Interventions/Progress Updates:    Session 1: Upon entering the room, pt supine in bed sleeping but agreeable to OT intervention. Pt reports, " I think I may have gone on myself." Pt rolled L with mod A and R with min A and was incontinent of bowel this session. Pt able to wash peri area with set up and therapist provided further hygiene for thoroughness. Once clean brief donned pt stated she felt like she needed to go again and was placed on bedpan for urgency. Pt unable to void second time. Pt performed supine >sit with mod A for R LE and trunk to EOB. Pt able to cross R LE only L knee herself this session. Once pt begins threading pants onto R LE she reports having bowel accident again. Pt returned to supine and brief soiled again with BM. Pt placed on bedpan again and RN present in room. All needs within reach.    Session 2: Upon entering the room, pt seated in wheelchair with son present in room. He left once therapist entered the room. Pt with no c/o,signs, or symptoms of pain this session. Pt appears to be visibly upset and expressed feeling of being overwhelmed, depressed, and upset with family over current situation and upcoming discharge. OT utilized therapeutic use of self and also educated pt on the importance of continued therapy in order to meet her goals and in order to return home in the future. Pt verbalized understanding and remained in wheelchair at end of session.   Therapy Documentation Precautions:   Precautions Precautions: Fall Precaution Comments: dense Rt hemiplegia Restrictions Weight Bearing Restrictions: No General: General OT Amount of Missed Time: 13 Minutes Vital Signs: Therapy Vitals Pulse Rate: 71 BP: (!) 173/84 Patient Position (if appropriate): Lying Oxygen Therapy SpO2: 97 % O2 Device: Not Delivered Pain:   ADL:   Exercises:   Other Treatments:    See Function Navigator for Current Functional Status.   Therapy/Group: Individual Therapy  [name], Macy Polio P 03/20/2016, 9:49 AM

## 2016-03-20 NOTE — Progress Notes (Signed)
Speech Language Pathology Daily Session Note  Patient Details  Name: Christina LungMildred C Bienaime MRN: [id number] Date of Birth: October 01, 1924  Today's Date: 03/20/2016 SLP Individual Time: 1000-1030 SLP Individual Time Calculation (min): 30 min  Short Term Goals: Week 3: SLP Short Term Goal 1 (Week 3): Pt will consume dys 1 textures and nectar thick liquids with supervision verbal cues for use of swallowing precautions.  SLP Short Term Goal 2 (Week 3): Pt will consume therapeutic trials of thin liquids with minimal overt s/s of aspiration over 2 consecutive sessions to demonstrate readiness for repeat MBS.  SLP Short Term Goal 3 (Week 3): Patient will utilize speech intelligibility strategies at the phrase level with Min A multimodal cues.  SLP Short Term Goal 4 (Week 3): Patient will utilize word-finding strateiges during functional tasks/conversations with supervision verbal cues.  SLP Short Term Goal 5 (Week 3): Patient will demonstrate functional problem solving for basic and familiar tasks with Mod A multimodal cues.  SLP Short Term Goal 6 (Week 3): Patient will recall new, daily information with Mod A verbal cues.   Skilled Therapeutic Interventions: Skilled treatment session focused on speech and dysphagia goals. Patient performed oral care via the suction toothbrush after setup assist with Mod I. Patient consumed trials of ice chips with overt cough X 1 of 6 trials. Recommend MBS tomorrow to assess swallow function for possible diet upgrade. Patient verbalized at the phrase level and required Mod A verbal cues for use of an increased vocal intensity. Patient left upright in wheelchair with RN present. Continue with current plan of care.      Function:  Cognition Comprehension Comprehension assist level: Understands basic 90% of the time/cues < 10% of the time  Expression   Expression assist level: Expresses basic 50 - 74% of the time/requires cueing 25 - 49% of the time. Needs to repeat parts of  sentences.  Social Interaction Social Interaction assist level: Interacts appropriately 75 - 89% of the time - Needs redirection for appropriate language or to initiate interaction.  Problem Solving Problem solving assist level: Solves basic 50 - 74% of the time/requires cueing 25 - 49% of the time  Memory Memory assist level: Recognizes or recalls 50 - 74% of the time/requires cueing 25 - 49% of the time    Pain No/Denies Pain   Therapy/Group: Individual Therapy  Rayonna Heldman 03/20/2016, 4:10 PM

## 2016-03-20 NOTE — Plan of Care (Signed)
Problem: RH KNOWLEDGE DEFICIT Goal: RH STG INCREASE KNOWLEDGE OF HYPERTENSION Patient and family will demonstrate knowledge of HTN medications and parameters, dietary restrictions, and BP and HR parameters with min assist from rehab staff.  Outcome: Progressing Reinforcement of dietary considerations, medications and their effects.

## 2016-03-20 NOTE — Progress Notes (Signed)
Nutrition Follow-up  DOCUMENTATION CODES:   Not applicable  INTERVENTION:  Continue Ensure Enlive po BID, each supplement provides 350 kcal and 20 grams of protein.  Continue 30 ml Prostat po BID, each supplement provides 100 kcal and 15 grams of protein.   Encourage adequate PO intake.   NUTRITION DIAGNOSIS:   Inadequate oral intake related to poor appetite as evidenced by per patient/family report, meal completion < 25%; ongoing  GOAL:   Patient will meet greater than or equal to 90% of their needs; met  MONITOR:   PO intake, Supplement acceptance, Weight trends, Skin, Labs  REASON FOR ASSESSMENT:   Malnutrition Screening Tool    ASSESSMENT:    83 yo female admitted with right hemiparesis and dysarthria/dysphagia secondary to left corona radiata,putamen,caudate infarct   Meal completion has been varied from 25-75% with po today of 50-75%. Pt currently has Prostat and Ensure ordered and has been consuming most of them. RD to continue with current orders to aid in caloric and protein needs.   Diet Order:  DIET - DYS 1 Room service appropriate? Yes; Fluid consistency: Nectar Thick  Skin:  Wound (see comment) (DTI to R heel)  Last BM:  2/1  Height:   Ht Readings from Last 1 Encounters:  02/29/16 _0  (1.702 m)    Weight:   Wt Readings from Last 1 Encounters:  03/12/16 130 lb 4.7 oz (59.1 kg)    Ideal Body Weight:     BMI:  Body mass index is 20.41 kg/m.  Estimated Nutritional Needs:   Kcal:  1500-1800 kcals  Protein:  70-85 g  Fluid:  >/= 1.5 L  EDUCATION NEEDS:   No education needs identified at this time  Corrin Parker, MS, RD, LDN Pager # [phone] After hours/ weekend pager # [phone]

## 2016-03-20 NOTE — Progress Notes (Signed)
Physical Therapy Session Note  Patient Details  Name: Christina Bradshaw MRN: [id number] Date of Birth: Jan 15, 1925  Today's Date: 03/20/2016 PT Individual Time: 7356-7014 PT Individual Time Calculation (min): 42 min   Short Term Goals: Week 3:  PT Short Term Goal 1 (Week 3): Pt will be able to perform basic transfers with mod assist PT Short Term Goal 2 (Week 3): Pt will be able to perform sit <> stands with mod assist PT Short Term Goal 3 (Week 3): Pt will be able to gait x 25' with mod assist PT Short Term Goal 4 (Week 3): Family/caregivers will begin hands-on family education  Skilled Therapeutic Interventions/Progress Updates:    no c/o pain, reporting fatigue but agreeable to session.  Session focus on activity tolerance via transfers and w/c propulsion, sitting balance focus on visual scanning, sequencing, and organization, and NMR for R shoulder sublux.    Shoes donned total assist. Pt propelled w/c to therapy gym with L hemi-technique and supervision with verbal cues for technique.  Slide board transfer w/c<>therapy mat with max assist to L and R.  Verbal cues for head hips relationship and forward weight shift.  Pt engaged in dynamic sitting balance reaching task focus on visual scanning, sequencing, and organization to locate cups in numerical order and move from R visual field to L visual field.  PT applied kinesiotape to R shoulder sublux with noted decrease in sublux distance following application.  Education provided on signs of tape intolerance and contraindication for heat and pt verbalized understanding.  Pt returned to room at end of session, max assist for squat/pivot to bed and mod assist to return to supine.  Positioned to comfort with call bell in reach and needs met.   Therapy Documentation Precautions:  Precautions Precautions: Fall Precaution Comments: dense Rt hemiplegia Restrictions Weight Bearing Restrictions: No   See Function Navigator for Current Functional  Status.   Therapy/Group: Individual Therapy  Earnest Conroy Penven-Crew 03/20/2016, 3:43 PM

## 2016-03-20 NOTE — Progress Notes (Signed)
Physical Therapy Session Note  Patient Details  Name: Christina Bradshaw MRN: [id number] Date of Birth: [birth year]  Today's Date: 03/20/2016 PT Individual Time: 1345-1415 PT Individual Time Calculation (min): 30 min   Short Term Goals: Week 3:  PT Short Term Goal 1 (Week 3): Pt will be able to perform basic transfers with mod assist PT Short Term Goal 2 (Week 3): Pt will be able to perform sit <> stands with mod assist PT Short Term Goal 3 (Week 3): Pt will be able to gait x 25' with mod assist PT Short Term Goal 4 (Week 3): Family/caregivers will begin hands-on family education  Skilled Therapeutic Interventions/Progress Updates:   Pt reports fatigue but no pain. Pt able to demonstrate active R knee extension against gravity, hip flexion, and slight ankle dorsiflexion today from seated position. Sit -> stand with max assist using rail for support with facilitation of weightshift and cues for upright posture. Using ACE wrap on RLE for dorsiflexion assist, neuro re-ed for gait x 15' with mod to max assist for facilitation of weightshift, facilitation of RLE in stance and placement of RLE and +2 needed for w/c follow for safety. Max assist squat pivot transfers during session on and off of Nustep with cues for head/hips relationship and technique. Nustep for neuro re-ed x 6 min on level 6 with BLE only using RLE adaptive guide to aid with positioning to prevent adduction. Pt propelled w/c with hemi-technique and supervision at end of session back to room with all needs in reach.   Therapy Documentation Precautions:  Precautions Precautions: Fall Precaution Comments: dense Rt hemiplegia Restrictions Weight Bearing Restrictions: No  Pain:  Denies pain.  See Function Navigator for Current Functional Status.   Therapy/Group: Individual Therapy  Karolee StampsGray, Irvin Bastin [name]  Trajon Rosete B. Makaylia Hewett, PT, DPT  03/20/2016, 2:30 PM

## 2016-03-20 NOTE — Patient Care Conference (Signed)
Inpatient RehabilitationTeam Conference and Plan of Care Update Date: 03/18/2016   Time: 2:30 PM    Patient Name: Christina Bradshaw      Medical Record Number: [id number]  Date of Birth: 03-27-1924 Sex: Female         Room/Bed: 4M01C/4M01C-01 Payor Info: Payor: MEDICARE / Plan: MEDICARE PART A AND B / Product Type: *No Product type* /    Admitting Diagnosis: l cva  Admit Date/Time:  02/29/2016  3:34 PM Admission Comments: No comment available   Primary Diagnosis:  Left sided cerebral hemisphere cerebrovascular accident (CVA) (HCC) Principal Problem: Left sided cerebral hemisphere cerebrovascular accident (CVA) [facility])  Patient Active Problem List   Diagnosis Date Noted  . Hyponatremia   . Lethargy   . Neuropathic pain   . Acute lower UTI   . Abnormal chest x-ray   . Hypernatremia   . Leukocytosis   . AKI (acute kidney injury) (HCC)   . Labile blood pressure   . Prediabetes   . Hypokalemia   . Hypoalbuminemia due to protein-calorie malnutrition (HCC)   . Stage 3 chronic kidney disease   . Left sided cerebral hemisphere cerebrovascular accident (CVA) (HCC) 02/29/2016  . Spastic hemiparesis of right dominant side due to cerebral infarction (HCC) 02/29/2016  . Dysphagia 02/29/2016  . Essential hypertension 02/29/2016  . TIA (transient ischemic attack) 02/27/2016  . CVA (cerebral vascular accident) (HCC) 02/27/2016    Expected Discharge Date: Expected Discharge Date:  (SNF)  Team Members Present: Physician leading conference: Dr. Maryla Morrow Social Worker Present: Amada Jupiter, LCSW Nurse Present: Kennyth Arnold, RN PT Present: Katherine Mantle, PT;Other (comment) (Rosita Dechalus, PTA) OT Present: Callie Fielding, OT SLP Present: Other (comment) Reuel Derby, SLP) PPS Coordinator present : Tora Duck, RN, CRRN     Current Status/Progress Goal Weekly Team Focus  Medical   Right hemiparesis and dysarthria/dysphagia secondary to left corona radiata,putamen,caudate infarct on  1/9  Improve mobility, transfers, UTI, tone  See above   Bowel/Bladder   (P) Incontinent of bowel and bladder. LBM 03/20/16  (P) Managed bowel and bladder  (P) Continue with timed toileting q 2hrs   Swallow/Nutrition/ Hydration   No improvement with dysphagia 2 tray, improved s/s of airway protection with trials of ice chips/thin liquids  Supervision  Working towards repeat instrument swallow assessment to determine readiness for advancement to thin liquids   ADL's   mod/max A functional transfers, mod A UB self care, max - total A LB self care, R UE flaccid  min A sitting balance, UB self care and mod A overall  ADL retraining, functional mobility, balance, R UE NMR, R visual scanning, pt/family education   Mobility   mod/maxA bed mobilty, mod/maxA SB transfers,   MinA functional mobilty, gait 19ft modA LRAD  Bed mobilty, w/c mobilty, SB transfers   Communication   Min to mod assist  Min Assist  continue to address increased vocal intensity, overtarticulation, slow rate, awareness and correction of verbal errors   Safety/Cognition/ Behavioral Observations  Mod A  Min assist  continue to address basic cognition   Pain   (P) Muscle rub to R shoulder, Tylenol 650mg  prn q 4hrs  (P) Monitor for nonverbal cues of pain  (P) <3   Skin   (P) Deep tissue injury to R heel. Blister to L heel with foam dressing to area   (P) No additional skin breakdown         *See Care Plan and progress notes for long and short-term  goals.  Barriers to Discharge: Mobiliy, safety, transfers, UTI, spasticity, CKD, dysphagia    Possible Resolutions to Barriers:  Follow labs, IVF d/ced, therapies, antispasticity meds, abx for UTI    Discharge Planning/Teaching Needs:  Son confirming this afternoon that family and pt are agreed on need to change d/c plan to SNF.  NA - plan for SNF   Team Discussion:  BP under better control.  Decreased spasticity.  Stopped IVF today and MD to monitor labs.  Concern that timed  toileting is NOT being done - RN to follow up.  Most goals being downgraded due to slow progress.  Her participation, however, is improved.  ST would like ot repeat MBS at end of week.  SW reports d/c plan changed to SNF.  Revisions to Treatment Plan:  Several goals downgraded and d/c plan changed to SNF   Continued Need for Acute Rehabilitation Level of Care: The patient requires daily medical management by a physician with specialized training in physical medicine and rehabilitation for the following conditions: Daily direction of a multidisciplinary physical rehabilitation program to ensure safe treatment while eliciting the highest outcome that is of practical value to the patient.: Yes Daily medical management of patient stability for increased activity during participation in an intensive rehabilitation regime.: Yes Daily analysis of laboratory values and/or radiology reports with any subsequent need for medication adjustment of medical intervention for : Neurological problems;Blood pressure problems;Renal problems;Urological problems  Tam Savoia 03/20/2016, 4:21 PM

## 2016-03-21 ENCOUNTER — Inpatient Hospital Stay (HOSPITAL_COMMUNITY): Payer: Medicare Other

## 2016-03-21 ENCOUNTER — Inpatient Hospital Stay (HOSPITAL_COMMUNITY): Payer: Medicare Other | Admitting: Occupational Therapy

## 2016-03-21 ENCOUNTER — Inpatient Hospital Stay (HOSPITAL_COMMUNITY): Payer: Medicare Other | Admitting: Speech Pathology

## 2016-03-21 LAB — CBC WITH DIFFERENTIAL/PLATELET
Basophils Absolute: 0.1 10*3/uL (ref 0.0–0.1)
Basophils Relative: 1 %
EOS PCT: 6 %
Eosinophils Absolute: 0.5 10*3/uL (ref 0.0–0.7)
HCT: 37.7 % (ref 36.0–46.0)
Hemoglobin: 12.3 g/dL (ref 12.0–15.0)
LYMPHS ABS: 1.9 10*3/uL (ref 0.7–4.0)
LYMPHS PCT: 23 %
MCH: 30.8 pg (ref 26.0–34.0)
MCHC: 32.6 g/dL (ref 30.0–36.0)
MCV: 94.3 fL (ref 78.0–100.0)
MONO ABS: 0.6 10*3/uL (ref 0.1–1.0)
Monocytes Relative: 7 %
Neutro Abs: 5 10*3/uL (ref 1.7–7.7)
Neutrophils Relative %: 63 %
PLATELETS: 243 10*3/uL (ref 150–400)
RBC: 4 MIL/uL (ref 3.87–5.11)
RDW: 14.5 % (ref 11.5–15.5)
WBC: 7.9 10*3/uL (ref 4.0–10.5)

## 2016-03-21 LAB — BASIC METABOLIC PANEL
Anion gap: 9 (ref 5–15)
BUN: 29 mg/dL — AB (ref 6–20)
CHLORIDE: 106 mmol/L (ref 101–111)
CO2: 21 mmol/L — AB (ref 22–32)
Calcium: 8.6 mg/dL — ABNORMAL LOW (ref 8.9–10.3)
Creatinine, Ser: 1.19 mg/dL — ABNORMAL HIGH (ref 0.44–1.00)
GFR calc Af Amer: 46 mL/min — ABNORMAL LOW (ref >60)
GFR calc non Af Amer: 39 mL/min — ABNORMAL LOW (ref >60)
GLUCOSE: 116 mg/dL — AB (ref 65–99)
POTASSIUM: 4.2 mmol/L (ref 3.5–5.1)
Sodium: 136 mmol/L (ref 135–145)

## 2016-03-21 NOTE — Progress Notes (Signed)
Speech Language Pathology Note  Patient Details  Name: Christina Bradshaw MRN: [id number] Date of Birth: [birth year] Today's Date: 03/21/2016  MBSS complete. Full report located under chart review in imaging section.    Dejon Lukas 03/21/2016, 9:48 AM

## 2016-03-21 NOTE — Progress Notes (Signed)
Occupational Therapy Session Note  Patient Details  Name: Christina Bradshaw MRN: [id number] Date of Birth: 1925/01/02  Today's Date: 03/21/2016 OT Individual Time: 3474-2595 and 6387-5643 OT Individual Time Calculation (min): 45 min and 29 min   Short Term Goals: Week 1:  OT Short Term Goal 1 (Week 1): Pt will complete sit > stand with max assist to complete LB dressing/hygiene OT Short Term Goal 1 - Progress (Week 1): Progressing toward goal OT Short Term Goal 2 (Week 1): Pt will complete toilet transfers with max assist 3 out of 4 attempts OT Short Term Goal 2 - Progress (Week 1): Not met OT Short Term Goal 3 (Week 1): Pt will don pants with max assist of one caregiver OT Short Term Goal 3 - Progress (Week 1): Progressing toward goal OT Short Term Goal 4 (Week 1): Pt will position RUE during self-care tasks with min cues to increase awareness of positioning and decrease injury to shoulder OT Short Term Goal 4 - Progress (Week 1): Not met Week 2:  OT Short Term Goal 1 (Week 2): Pt will complete LB dressing with Max A (1 helper) OT Short Term Goal 1 - Progress (Week 2): Met OT Short Term Goal 2 (Week 2): Pt will complete bathing with Min A and AE PRN OT Short Term Goal 2 - Progress (Week 2): Progressing toward goal OT Short Term Goal 3 (Week 2): Pt will complete toilet transfers with Max A and LRAD  OT Short Term Goal 3 - Progress (Week 2): Met OT Short Term Goal 4 (Week 2): Pt will utilize R UE as stabilizer for grooming tasks with min cues  OT Short Term Goal 4 - Progress (Week 2): Progressing toward goal Week 3:  OT Short Term Goal 1 (Week 3): STGs=LTGs secondary to upcoming discharge    Skilled Therapeutic Interventions/Progress Updates: Pt was lying in bed with SLP at time of arrival. She was agreeable to complete ADLs. Tx focus on safe positioning/NMR of R UE, functional transfers, and hemi dressing skills. Pt completed supine<sit with HOB flat and Mod A. She was able to bring both  legs to EOB with extra time. Squat pivot transfer from EOB<w/c completed with Max A with cues for hand placement. Dressing completed w/c level at sink with extra time, mod cues for hemi dressing techniques. Pt able to cross R LE over left knee for threading pant leg. Sit<stand for LB dressing completed with Max A, right knee blocked and manual facilitation of R UE weight bearing. Afterwards she completed oral care tasks while seated, mod cues for using R UE as stabilizer instead of trying to complete with left hand only. She was left in w/c with half lap tray and all needs within reach at time of departure.   2nd Session 1:1 Tx (29 min)   Pt was sitting in w/c at time of arrival, agreeable to tx. Pt completed AAROM exercises x10 reps 2 sets shoulder flexion, elbow flexion, forearm pronation/supination, and digit flexion/extension for R UE NMR. Retrograde massage completed to decrease edema in digits. Pt required max vcs on technique as well as for safe handling techniques for R UE (i.e. Lifting from forearm instead of from digits). Her favorite music was utilized during exercises to increase affect. At end of session pt was left in w/c with all needs within reach and R UE elevated.  Therapy Documentation Precautions:  Precautions Precautions: Fall Precaution Comments: dense Rt hemiplegia Restrictions Weight Bearing Restrictions: No   Vital Signs:  Therapy Vitals Temp: 98.4 F (36.9 C) Temp Source: Oral Pulse Rate: (!) 58 Resp: 16 BP: (!) 90/55 Patient Position (if appropriate): Sitting Oxygen Therapy SpO2: 98 % O2 Device: Not Delivered Pain: No c/o pain during session  Pain Assessment Pain Assessment: No/denies pain Pain Score: 0-No pain ADL:      See Function Navigator for Current Functional Status.   Therapy/Group: Individual Therapy  Christina Bradshaw 03/21/2016, 12:38 PM

## 2016-03-21 NOTE — Progress Notes (Signed)
Speech Language Pathology Weekly Progress and Session Note  Patient Details  Name: Christina Bradshaw MRN: [id number] Date of Birth: Feb 11, 1925  Beginning of progress report period: March 14, 2016 End of progress report period: March 21, 2016  Today's Date: 03/21/2016 SLP Individual Time: 3794-3276 SLP Individual Time Calculation (min): 30 min  Short Term Goals: Week 3: SLP Short Term Goal 1 (Week 3): Pt will consume dys 1 textures and nectar thick liquids with supervision verbal cues for use of swallowing precautions.  SLP Short Term Goal 1 - Progress (Week 3): Met SLP Short Term Goal 2 (Week 3): Pt will consume therapeutic trials of thin liquids with minimal overt s/s of aspiration over 2 consecutive sessions to demonstrate readiness for repeat MBS.  SLP Short Term Goal 2 - Progress (Week 3): Met SLP Short Term Goal 3 (Week 3): Patient will utilize speech intelligibility strategies at the phrase level with Min A multimodal cues.  SLP Short Term Goal 3 - Progress (Week 3): Not met SLP Short Term Goal 4 (Week 3): Patient will utilize word-finding strateiges during functional tasks/conversations with supervision verbal cues.  SLP Short Term Goal 4 - Progress (Week 3): Not met SLP Short Term Goal 5 (Week 3): Patient will demonstrate functional problem solving for basic and familiar tasks with Mod A multimodal cues.  SLP Short Term Goal 5 - Progress (Week 3): Met SLP Short Term Goal 6 (Week 3): Patient will recall new, daily information with Mod A verbal cues.  SLP Short Term Goal 6 - Progress (Week 3): Met    New Short Term Goals: Week 4: SLP Short Term Goal 1 (Week 4): Pt will consume dysphagia 1 diet with thin liquids with Mod A verbal cues for use of compensatory swallow strategies.  SLP Short Term Goal 2 (Week 4): Pt will consume dysphagia 1 diet with thin liquids without overt s/s of aspiration.  SLP Short Term Goal 3 (Week 4): Patient will utilize speech intelligibility strategies  at the phrase level with Min A multimodal cues to achieve >75% intelligibility.  SLP Short Term Goal 4 (Week 4): Patient will utilize word-finding strateiges during functional tasks/conversations with Min A verbal cues.  SLP Short Term Goal 5 (Week 4): Patient will demonstrate functional problem solving for basic and familiar tasks with Min A multimodal cues.  SLP Short Term Goal 6 (Week 4): Patient will recall new, daily information with Min A verbal cues.   Weekly Progress Updates: Pt has made functional gains and has met 4 of 6 STG's. She has demonstrated improvement in swallow function and was recently upgraded to thin liquids with use chin tuck and multiple swallows. Pt continues to exhibit deficits in the areas of speech intelligibility, memory and problem solving. Overall she requires Mod A for these areas and requires skilled ST to address these deficits to increase functional independence.      Intensity: Minumum of 1-2 x/day, 30 to 90 minutes Frequency: 3 to 5 out of 7 days Duration/Length of Stay: 03/26/16 Treatment/Interventions: Cognitive remediation/compensation;Cueing hierarchy;Functional tasks;Patient/family education;Therapeutic Activities;Internal/external aids;Speech/Language facilitation;Environmental controls   Daily Session  Skilled Therapeutic Interventions: Skilled treatment session focused on dysphagia and speech intelligibility goals. SLP facilitated session by providing skilled observations with Mod A faded to Min A verbal cues for use of chin tuck with straw to facilitate and multiple swallows. Pt without overt s/s of aspiration with consumption, results of Modified Barium Swallow Study reviewed and recommended thins liquids with above mentioned strategies. Education provided to pt and  nursing and upgrade to thin liquids requested. Pt required Mod A verbal cues to utilize speech intelligibility strategies for ~50% intelligibility at the phrase level d/t decreased vocal  intensity. Pt handed off to OT.     Function:   Eating Eating   Modified Consistency Diet: Yes Eating Assist Level: Supervision or verbal cues;Helper checks for pocketed food   Eating Set Up Assist For: Opening containers       Cognition Comprehension Comprehension assist level: Understands basic 90% of the time/cues < 10% of the time  Expression   Expression assist level: Expresses basic 50 - 74% of the time/requires cueing 25 - 49% of the time. Needs to repeat parts of sentences.  Social Interaction Social Interaction assist level: Interacts appropriately 75 - 89% of the time - Needs redirection for appropriate language or to initiate interaction.  Problem Solving Problem solving assist level: Solves basic 50 - 74% of the time/requires cueing 25 - 49% of the time  Memory Memory assist level: Recognizes or recalls 50 - 74% of the time/requires cueing 25 - 49% of the time   General    Pain    Therapy/Group: Individual Therapy  Nagee Goates B. Rutherford Nail, M.S., CCC-SLP Speech-Language Pathologist Maribelle Hopple 03/21/2016, 4:30 PM

## 2016-03-21 NOTE — Plan of Care (Signed)
Problem: RH BLADDER ELIMINATION Goal: RH STG MANAGE BLADDER WITH EQUIPMENT WITH ASSISTANCE STG Manage Bladder With Equipment With mod Assistance   Outcome: Progressing Set up bladder regimen/schedule for using BSCC or bedpan and decrease need for diapers to keep skin drier and promote continence.

## 2016-03-21 NOTE — Progress Notes (Signed)
[facility] PHYSICAL MEDICINE & REHABILITATION     PROGRESS NOTE  Subjective/Complaints:  No new issues overnight. MBS this morning. Sleeping well  ROS: pt denies nausea, vomiting, diarrhea, cough, shortness of breath or chest pain   Objective: Vital Signs: Blood pressure (!) 142/62, pulse 62, temperature 98.3 F (36.8 C), temperature source Oral, resp. rate 17, height 5\' 7"  (1.702 m), weight 59.1 kg (130 lb 4.7 oz), SpO2 95 %. No results found.  Recent Labs  03/19/16 0513 03/21/16 0514  WBC 8.9 7.9  HGB 12.1 12.3  HCT 37.2 37.7  PLT 235 243    Recent Labs  03/19/16 0513 03/21/16 0514  NA 136 136  K 4.6 4.2  CL 108 106  GLUCOSE 111* 116*  BUN 26* 29*  CREATININE 1.04* 1.19*  CALCIUM 8.4* 8.6*   CBG (last 3)  No results for input(s): GLUCAP in the last 72 hours.  Wt Readings from Last 3 Encounters:  03/12/16 59.1 kg (130 lb 4.7 oz)  02/27/16 66.5 kg (146 lb 9.7 oz)  10/25/14 67.6 kg (149 lb)    Physical Exam:  BP (!) 142/62 (BP Location: Left Arm)   Pulse 62   Temp 98.3 F (36.8 C) (Oral)   Resp 17   Ht 5\' 7"  (1.702 m)   Wt 59.1 kg (130 lb 4.7 oz)   SpO2 95%   BMI 20.41 kg/m  Constitutional: She appears well-developed and well-nourished.  HENT: Normocephalic and atraumatic.  Eyes: EOMI. No discharge.  Cardiovascular: RRR without JVD Respiratory: CTA B/L. GI: Soft. Bowel sounds are normal.  Musculoskeletal: She exhibits no edema. No tenderness.  Neurological: She is alert.  Right facial weakness with dysarthria Able to follow basic commands Motor: RUE 0/5 prox to distal no change RLE: 2-/5 HF, 2-/5 KE, 1+/5 ADF/DP.  mAS: 1/4 elbow flexors, wrist extensors, knee flexors, 1/4 ADF  Skin: Skin is warm and dry. Intact. Psychiatric: Flat affect.   Assessment/Plan: 1. Functional deficits secondary to left corona radiata,putamen,caudate infarct which require 3+ hours per day of interdisciplinary therapy in a comprehensive inpatient rehab  setting. Physiatrist is providing close team supervision and 24 hour management of active medical problems listed below. Physiatrist and rehab team continue to assess barriers to discharge/monitor patient progress toward functional and medical goals.  Function:  Bathing Bathing position Bathing activity did not occur: Refused Position: Systems developer parts bathed by patient: Right arm, Chest, Abdomen, Front perineal area, Right upper leg, Right lower leg, Left upper leg Body parts bathed by helper: Left arm, Buttocks, Back  Bathing assist Assist Level: More than reasonable time, Touching or steadying assistance(Pt > 75%)      Upper Body Dressing/Undressing Upper body dressing   What is the patient wearing?: Pull over shirt/dress     Pull over shirt/dress - Perfomed by patient: Thread/unthread left sleeve, Pull shirt over trunk Pull over shirt/dress - Perfomed by helper: Thread/unthread right sleeve, Put head through opening        Upper body assist Assist Level:  (mod A)      Lower Body Dressing/Undressing Lower body dressing Lower body dressing/undressing activity did not occur: N/A What is the patient wearing?: Pants     Pants- Performed by patient: Thread/unthread left pants leg Pants- Performed by helper: Thread/unthread right pants leg, Pull pants up/down   Non-skid slipper socks- Performed by helper: Don/doff right sock, Don/doff left sock       Shoes - Performed by helper: Don/doff right shoe, Don/doff left  shoe, Fasten right, Fasten left          Lower body assist Assist for lower body dressing:  (max a)      Financial trader activity did not occur: No continent bowel/bladder event Toileting steps completed by patient: Adjust clothing after toileting, Adjust clothing prior to toileting Toileting steps completed by helper: Adjust clothing prior to toileting, Performs perineal hygiene, Adjust clothing after toileting Toileting  Assistive Devices: Grab bar or rail  Toileting assist Assist level: Touching or steadying assistance (Pt.75%)   Transfers Chair/bed transfer Chair/bed transfer activity did not occur: N/A Chair/bed transfer method: Lateral scoot, Squat pivot Chair/bed transfer assist level: Maximal assist (Pt 25 - 49%/lift and lower) Chair/bed transfer assistive device: Armrests Mechanical lift: Stedy   Locomotion Ambulation     Max distance: 15' Assist level: 2 helpers (max A with +2 for w/c follow)   Wheelchair   Type: Manual Max wheelchair distance: 110' Assist Level: Supervision or verbal cues  Cognition Comprehension Comprehension assist level: Understands basic 90% of the time/cues < 10% of the time  Expression Expression assist level: Expresses basic 50 - 74% of the time/requires cueing 25 - 49% of the time. Needs to repeat parts of sentences.  Social Interaction Social Interaction assist level: Interacts appropriately 75 - 89% of the time - Needs redirection for appropriate language or to initiate interaction.  Problem Solving Problem solving assist level: Solves basic 50 - 74% of the time/requires cueing 25 - 49% of the time  Memory Memory assist level: Recognizes or recalls 50 - 74% of the time/requires cueing 25 - 49% of the time    Medical Problem List and Plan: 1. Right hemiparesis and dysarthria/dysphagia secondary to left corona radiata,putamen,caudate infarct on 1/9  Cont CIR therapies 2. DVT Prophylaxis/Anticoagulation: Pharmaceutical: Lovenox  3. Pain Management: tylenol prn   Robaxin 500 TID PRN   Neurontin started 1/23  4. Mood:   Has history of anxiety--appears controlled at present  Started prozac to help with mood and recovery.   LCSW to follow for evaluation and support.  5. Neuropsych: This patient is not fully capable of making decisions on her own behalf.  6. Skin/Wound Care: Routine pressure relief measures. Maintain adequate nutritional and hydration status.  7.  Fluids/Electrolytes/Nutrition: Monitor I/O. Supplements between meals.  8. HTN:  Norvasc increased to 10mg  on 1/15  Hydralazine  increased to 25 on 1/18   Cont lisinopril and metoprolol bid.   Improved control over all 9. Dysphagia: Dysphagia 1, advanced to nectar liquids. Offer fluids between meals to maintain adequate hydration.   -MBS this morning 10. Dyslipidemia: on lipitor  11. Prediabetes: Hgb A1c- 6.0.   Modified diet to HH/CM. RD to educate patient and family on appropriate diet.   CBGs no longer being checked 12. GERD: Managed On Protonix.  13. Chronic constipation: Uses softners as well as suppository daily.   Started senna S at supper followed by suppository in am.  14. Right spastic hemiparesis:   Baclofen 5 TID.   Resting hand splint and R-AFO, discussed with nursing bracing at night.  ROM with therapy.   continue stretching,positioning    Will likely need Botox as outpt 15. Hypokalemia:   K+ 4.2 today  Mg+ 2.1 on 1/19  IVF with K+ qHS, d/ced on 1/31    16. Hypoalbuminemia  Supplement initiated 1/13 17. CKD III  With AKI, BUN holding around 1.04-1.24-----today 1.19  IVF qHS started 1/16,  changed to NS, d/ced on 1/31.  Continue to push orals/follow serial labs  -upgrade of diet this morning would be helpful  I personally reviewed the patient's labs today.  18. Leukocytosis- Resolved  WBCs 7.9  -I personally reviewed the patient's labs today.   Afebrile  CXR reviewed, unremarkable for infectious process, small left pleural effusion 19. Hyponatremia:   Na+ 136 today  Cont to monitor    20. Acute lower UTI  UA+, Ucx >Morganella and [place]se[place]  Bactrim   1/25-2/1  LOS (Days) 21 A FACE TO FACE EVALUATION WAS PERFORMED  SWARTZ,ZACHARY T 03/21/2016 9:05 AM

## 2016-03-21 NOTE — Progress Notes (Signed)
Physical Therapy Weekly Progress Note  Patient Details  Name: Christina Bradshaw MRN: [id number] Date of Birth: Jun 20, 1924  Beginning of progress report period: March 15, 2016 End of progress report period: March 21, 2016  Today's Date: 03/21/2016 PT Individual Time: 1345-1430 PT Individual Time Calculation (min): 45 min  Session focused on neuro re-ed to address transfers, sit <> stands, and bed mobility re-training with cues for sequencing, hand placement, and facilitation for weighshifting. Pt able to complete slideboard transfers with mod to max assist during session demonstrating improved technique. Max assist for sit <> stands and able to maintain static standing without UE support for about 5 seconds with mod assist and cues for weightshift to RUE. Supervision for w/c propulsion using hemi-technique. End of session returned back to bed to rest due to fatigue.   Patient has met 0 of 4 short term goals.  Pt is making progress with mobility this week but continues to require overall max assist for transfers and standing/gait. Pt now with active movement in RLE but still dense hemiplegia in RUE. Plan has changed to SNF due to level of care needed and family unable to provide this at this time.   Patient continues to demonstrate the following deficits muscle weakness, muscle joint tightness and muscle paralysis, decreased cardiorespiratoy endurance, abnormal tone, unbalanced muscle activation and decreased coordination, decreased attention to right, decreased initiation, decreased attention, decreased awareness, decreased problem solving, decreased safety awareness, decreased memory and delayed processing and decreased sitting balance, decreased standing balance, decreased postural control, hemiplegia and decreased balance strategies and therefore will continue to benefit from skilled PT intervention to increase functional independence with mobility.  Patient progressing toward long term  goals..  Plan of care revisions: downgraded goals to overall mod assist due to d/c now SNF.Marland Kitchen  PT Short Term Goals Week 3:  PT Short Term Goal 1 (Week 3): Pt will be able to perform basic transfers with mod assist PT Short Term Goal 1 - Progress (Week 3): Progressing toward goal PT Short Term Goal 2 (Week 3): Pt will be able to perform sit <> stands with mod assist PT Short Term Goal 2 - Progress (Week 3): Progressing toward goal PT Short Term Goal 3 (Week 3): Pt will be able to gait x 25' with mod assist PT Short Term Goal 3 - Progress (Week 3): Progressing toward goal PT Short Term Goal 4 (Week 3): Family/caregivers will begin hands-on family education PT Short Term Goal 4 - Progress (Week 3): Not met Week 4:  PT Short Term Goal 1 (Week 4): = LTGs due to LOS -D/c plan for SNF  Skilled Therapeutic Interventions/Progress Updates:  Ambulation/gait training;Balance/vestibular training;Cognitive remediation/compensation;Discharge planning;Disease management/prevention;DME/adaptive equipment instruction;Functional mobility training;Neuromuscular re-education;Pain management;Patient/family education;Psychosocial support;Skin care/wound management;Splinting/orthotics;Stair training;Therapeutic Activities;Therapeutic Exercise;UE/LE Strength taining/ROM;UE/LE Coordination activities;Visual/perceptual remediation/compensation;Wheelchair propulsion/positioning   Therapy Documentation Precautions:  Precautions Precautions: Fall Precaution Comments: dense Rt hemiplegia Restrictions Weight Bearing Restrictions: No  Pain:  Denies pain.    See Function Navigator for Current Functional Status.  Therapy/Group: Individual Therapy  Canary Brim Ivory Broad, PT, DPT  03/21/2016, 3:22 PM

## 2016-03-21 NOTE — Plan of Care (Signed)
Problem: RH BOWEL ELIMINATION Goal: RH STG MANAGE BOWEL W/MEDICATION W/ASSISTANCE STG Manage Bowel with Medication with Max Assistance.    Outcome: Progressing Pt had bowel movement today, had both an incontinent episode and a continent.  Will continue to monitor and work with a bowel/bladder regimen to regain continence.

## 2016-03-21 NOTE — Plan of Care (Signed)
Problem: RH Balance Goal: LTG Patient will maintain dynamic standing balance (PT) LTG:  Patient will maintain dynamic standing balance with assistance during mobility activities (PT)  Goal modified due to lack of progress and d/c plan SNF  Problem: RH Bed Mobility Goal: LTG Patient will perform bed mobility with assist (PT) LTG: Patient will perform bed mobility with assistance, with/without cues (PT).  Goal downgraded due to lack of progress and D/c plan SNF  Problem: RH Bed to Chair Transfers Goal: LTG Patient will perform bed/chair transfers w/assist (PT) LTG: Patient will perform bed/chair transfers with assistance, with/without cues (PT).  Goal downgraded due to slow progress and d/c plan to SNF. ABG  Problem: RH Ambulation Goal: LTG Patient will ambulate in controlled environment (PT) LTG: Patient will ambulate in a controlled environment, # of feet with assistance (PT).  Downgraded due to d/c plan SNF and slow progress.

## 2016-03-22 ENCOUNTER — Inpatient Hospital Stay (HOSPITAL_COMMUNITY): Payer: Medicare Other | Admitting: Speech Pathology

## 2016-03-22 ENCOUNTER — Inpatient Hospital Stay (HOSPITAL_COMMUNITY): Payer: Medicare Other | Admitting: Occupational Therapy

## 2016-03-22 ENCOUNTER — Inpatient Hospital Stay (HOSPITAL_COMMUNITY): Payer: Medicare Other | Admitting: Physical Therapy

## 2016-03-22 NOTE — Plan of Care (Signed)
Problem: RH SKIN INTEGRITY Goal: RH STG SKIN FREE OF INFECTION/BREAKDOWN Skin to remain free from infection and breakdown with Total Assist while on rehab   Outcome: Progressing DTI bilateral heels protective foam dressing to heels

## 2016-03-22 NOTE — Progress Notes (Signed)
[facility] PHYSICAL MEDICINE & REHABILITATION     PROGRESS NOTE  Subjective/Complaints:  "right leg drawing up at night" No shouder pain on R  ROS: pt denies nausea, vomiting, diarrhea, cough, shortness of breath or chest pain   Objective: Vital Signs: Blood pressure 128/62, pulse 65, temperature 98.5 F (36.9 C), temperature source Oral, resp. rate 16, height 5\' 7"  (1.702 m), weight 59.1 kg (130 lb 4.7 oz), SpO2 98 %. Dg Swallowing Func-speech Pathology  Result Date: 03/21/2016 Objective Swallowing Evaluation: Type of Study: MBS-Modified Barium Swallow Study Patient Details Name: Christina Bradshaw MRN: [id number] Date of Birth: 1924-03-30 Today's Date: 03/21/2016 Time: SLP Start Time (ACUTE ONLY): 0900-SLP Stop Time (ACUTE ONLY): 0925 SLP Time Calculation (min) (ACUTE ONLY): 25 min Past Medical History: Past Medical History: Diagnosis Date . Hypertension  . Stroke [facility])  Past Surgical History: Past Surgical History: Procedure Laterality Date . ABDOMINAL HYSTERECTOMY   HPI: See H&P Assessment / Plan / Recommendation CHL IP CLINICAL IMPRESSIONS 03/21/2016 Therapy Diagnosis Moderate oral phase dysphagia;Mild pharyngeal phase dysphagia Clinical Impression Patient demonstrates a moderate oral phase dysphagia and mild pharyngeal phase dysphagia. Oral phase is characterized by prolonged AP transit with anterior spillage with all liquids and oral residue with all consistencies. Pharyngeal phase is characterized by delayed swallow initiation to the valleculae and decreased base of tongue retraction resulting in mild vallecular residue with all consistencies.  Patient demonstrated intermittent sensed aspiration with thin liquids via cup due to bolus size. However, aspiration was eliminated with small, single sips via straw (to facilitate chin tuck) with the use of a chin tuck that patient was able to utilize with intermittent verbal cues.  A cued second swallow as also successful in reducing vallecular residue.  Recommend patient continue Dys. 1 textures but upgrade to thin liquids via straw with the use of a chin tuck and multiple swallows. Patient will also require full supervision to maximize utilization of strategies.  Educated patient on results and recommendations, she verbalized understanding.  Impact on safety and function Moderate aspiration risk   CH[facility] IP TREATMENT RECOMMENDATION 03/21/2016 Treatment Recommendations Therapy as outlined in treatment plan below   Prognosis 03/21/2016 Prognosis for Safe Diet Advancement Good Barriers to Reach Goals -- Barriers/Prognosis Comment -- [facility] IP DIET RECOMMENDATION 03/21/2016 SLP Diet Recommendations Dysphagia 1 (Puree) solids;Thin liquid Liquid Administration via Straw Medication Administration Whole meds with puree Compensations Minimize environmental distractions;Slow rate;Small sips/bites;Monitor for anterior loss;Chin tuck;Use straw to facilitate chin tuck;Multiple dry swallows after each bite/sip Postural Changes Seated upright at 90 degrees   CHL IP OTHER RECOMMENDATIONS 03/21/2016 Recommended Consults -- Oral Care Recommendations Oral care BID Other Recommendations --   [facility] IP FOLLOW UP RECOMMENDATIONS 03/21/2016 Follow up Recommendations 24 hour supervision/assistance;Skilled Nursing facility   [facility] IP FREQUENCY AND DURATION 03/21/2016 Speech Therapy Frequency (ACUTE ONLY) min 5x/week Treatment Duration 2 weeks      CHL IP ORAL PHASE 03/21/2016 Oral Phase Impaired Oral - Pudding Teaspoon -- Oral - Pudding Cup -- Oral - Honey Teaspoon -- Oral - Honey Cup -- Oral - Nectar Teaspoon -- Oral - Nectar Cup Delayed oral transit;Right anterior bolus loss Oral - Nectar Straw -- Oral - Thin Teaspoon Left anterior bolus loss;Delayed oral transit Oral - Thin Cup Right anterior bolus loss;Delayed oral transit Oral - Thin Straw Right anterior bolus loss;Delayed oral transit Oral - Puree Right anterior bolus loss;Delayed oral transit Oral - Mech Soft -- Oral - Regular -- Oral - Multi-Consistency  -- Oral - Pill -- Oral  Phase - Comment Oral residue with all consistencies   CHL IP PHARYNGEAL PHASE 03/21/2016 Pharyngeal Phase Impaired Pharyngeal- Pudding Teaspoon -- Pharyngeal -- Pharyngeal- Pudding Cup -- Pharyngeal -- Pharyngeal- Honey Teaspoon -- Pharyngeal -- Pharyngeal- Honey Cup -- Pharyngeal -- Pharyngeal- Nectar Teaspoon -- Pharyngeal -- Pharyngeal- Nectar Cup Delayed swallow initiation-vallecula;Reduced tongue base retraction;Pharyngeal residue - valleculae;Compensatory strategies attempted (with notebox) Pharyngeal -- Pharyngeal- Nectar Straw -- Pharyngeal -- Pharyngeal- Thin Teaspoon Delayed swallow initiation-vallecula;Reduced tongue base retraction;Pharyngeal residue - valleculae;Compensatory strategies attempted (with notebox) Pharyngeal -- Pharyngeal- Thin Cup Delayed swallow initiation-vallecula;Pharyngeal residue - valleculae;Penetration/Aspiration during swallow;Compensatory strategies attempted (with notebox) Pharyngeal Material enters airway, passes BELOW cords then ejected out Pharyngeal- Thin Straw Delayed swallow initiation-vallecula;Compensatory strategies attempted (with notebox) Pharyngeal -- Pharyngeal- Puree Delayed swallow initiation-vallecula;Pharyngeal residue - valleculae;Compensatory strategies attempted (with notebox) Pharyngeal -- Pharyngeal- Mechanical Soft -- Pharyngeal -- Pharyngeal- Regular -- Pharyngeal -- Pharyngeal- Multi-consistency -- Pharyngeal -- Pharyngeal- Pill -- Pharyngeal -- Pharyngeal Comment cued second swallow reduced/eliminated valleculae residue. Thin liquids via straw to facilitate chin tuck successful in eliminating aspiration   CHL IP CERVICAL ESOPHAGEAL PHASE 03/21/2016 Cervical Esophageal Phase WFL Pudding Teaspoon -- Pudding Cup -- Honey Teaspoon -- Honey Cup -- Nectar Teaspoon -- Nectar Cup -- Nectar Straw -- Thin Teaspoon -- Thin Cup -- Thin Straw -- Puree -- Mechanical Soft -- Regular -- Multi-consistency -- Pill -- Cervical Esophageal Comment -- No  flowsheet data found. PAYNE, COURTNEY 03/21/2016, 9:40 AM               Recent Labs  03/21/16 0514  WBC 7.9  HGB 12.3  HCT 37.7  PLT 243    Recent Labs  03/21/16 0514  NA 136  K 4.2  CL 106  GLUCOSE 116*  BUN 29*  CREATININE 1.19*  CALCIUM 8.6*   CBG (last 3)  No results for input(s): GLUCAP in the last 72 hours.  Wt Readings from Last 3 Encounters:  03/12/16 59.1 kg (130 lb 4.7 oz)  02/27/16 66.5 kg (146 lb 9.7 oz)  10/25/14 67.6 kg (149 lb)    Physical Exam:  BP 128/62 (BP Location: Left Arm)   Pulse 65   Temp 98.5 F (36.9 C) (Oral)   Resp 16   Ht 5\' 7"  (1.702 m)   Wt 59.1 kg (130 lb 4.7 oz)   SpO2 98%   BMI 20.41 kg/m  Constitutional: She appears well-developed and well-nourished.  HENT: Normocephalic and atraumatic.  Eyes: EOMI. No discharge.  Cardiovascular: RRR without JVD Respiratory: CTA B/L. GI: Soft. Bowel sounds are normal.  Musculoskeletal: She exhibits no edema. No tenderness.  Neurological: She is alert.  Right facial weakness with severe dysarthria Able to follow basic commands Motor: RUE 0/5 prox to distal no change RLE: 2-/5 HF, 2-/5 KE, 1+/5 ADF/DP.  mAS: 1/4 elbow flexors, wrist extensors, knee flexors, 1/4 ADF  Skin: Skin is warm and dry. Intact. Psychiatric: Flat affect.   Assessment/Plan: 1. Functional deficits secondary to left corona radiata,putamen,caudate infarct which require 3+ hours per day of interdisciplinary therapy in a comprehensive inpatient rehab setting. Physiatrist is providing close team supervision and 24 hour management of active medical problems listed below. Physiatrist and rehab team continue to assess barriers to discharge/monitor patient progress toward functional and medical goals.  Function:  Bathing Bathing position Bathing activity did not occur: Refused Position: Shower  Bathing parts Body parts bathed by patient: Right arm, Chest, Abdomen, Front perineal area, Right upper leg, Right lower leg, Left  upper leg Body  parts bathed by helper: Left arm, Buttocks, Back  Bathing assist Assist Level: More than reasonable time, Touching or steadying assistance(Pt > 75%)      Upper Body Dressing/Undressing Upper body dressing   What is the patient wearing?: Pull over shirt/dress     Pull over shirt/dress - Perfomed by patient: Thread/unthread right sleeve, Thread/unthread left sleeve, Put head through opening, Pull shirt over trunk Pull over shirt/dress - Perfomed by helper: Thread/unthread right sleeve, Put head through opening        Upper body assist Assist Level: Supervision or verbal cues      Lower Body Dressing/Undressing Lower body dressing Lower body dressing/undressing activity did not occur: N/A What is the patient wearing?: Pants, Non-skid slipper socks     Pants- Performed by patient: Thread/unthread left pants leg Pants- Performed by helper: Thread/unthread right pants leg, Pull pants up/down   Non-skid slipper socks- Performed by helper: Don/doff right sock, Don/doff left sock       Shoes - Performed by helper: Don/doff right shoe, Don/doff left shoe, Fasten right, Fasten left          Lower body assist Assist for lower body dressing:  (Max A)      Toileting Toileting Toileting activity did not occur: No continent bowel/bladder event Toileting steps completed by patient: Adjust clothing after toileting, Adjust clothing prior to toileting Toileting steps completed by helper: Adjust clothing prior to toileting, Performs perineal hygiene, Adjust clothing after toileting Toileting Assistive Devices: Grab bar or rail  Toileting assist Assist level: Touching or steadying assistance (Pt.75%)   Transfers Chair/bed transfer Chair/bed transfer activity did not occur: N/A Chair/bed transfer method: Squat pivot Chair/bed transfer assist level: Maximal assist (Pt 25 - 49%/lift and lower) Chair/bed transfer assistive device: Armrests Mechanical lift: Armed forces operational officertedy    Locomotion Ambulation     Max distance: 15' Assist level: 2 helpers (max A with +2 for w/c follow)   Wheelchair   Type: Manual Max wheelchair distance: 120' Assist Level: Supervision or verbal cues  Cognition Comprehension Comprehension assist level: Understands basic 90% of the time/cues < 10% of the time  Expression Expression assist level: Expresses basic 50 - 74% of the time/requires cueing 25 - 49% of the time. Needs to repeat parts of sentences.  Social Interaction Social Interaction assist level: Interacts appropriately 75 - 89% of the time - Needs redirection for appropriate language or to initiate interaction.  Problem Solving Problem solving assist level: Solves basic 50 - 74% of the time/requires cueing 25 - 49% of the time  Memory Memory assist level: Recognizes or recalls 50 - 74% of the time/requires cueing 25 - 49% of the time    Medical Problem List and Plan: 1. Right hemiparesis and dysarthria/dysphagia secondary to left corona radiata,putamen,caudate infarct   Cont CIR therapies, plan for SNF 2. DVT Prophylaxis/Anticoagulation: Pharmaceutical: Lovenox  3. Pain Management: tylenol prn   Robaxin 500 TID PRN   Neurontin started 1/23  4. Mood:   Has history of anxiety--appears controlled at present  Started prozac to help with mood and recovery.   LCSW to follow for evaluation and support.  5. Neuropsych: This patient is not fully capable of making decisions on her own behalf.  6. Skin/Wound Care: Routine pressure relief measures. Maintain adequate nutritional and hydration status.  7. Fluids/Electrolytes/Nutrition: Monitor I/O. Supplements between meals.  8. HTN:  Norvasc increased to 10mg  on 1/15  Hydralazine  increased to 25 on 1/18   Cont lisinopril and metoprolol bid.  Vitals:   03/21/16 2127 03/22/16 0451  BP:  128/62  Pulse: 66 65  Resp:  16  Temp:  98.5 F (36.9 C)   9. Dysphagia: Dysphagia 1, advanced to nectar liquids. Offer fluids between  meals to maintain adequate hydration.   -MBS this morning 10. Dyslipidemia: on lipitor  11. Prediabetes: Hgb A1c- 6.0.   Modified diet to HH/CM. RD to educate patient and family on appropriate diet.   CBGs no longer being checked 12. GERD: Managed On Protonix.  13. Chronic constipation: Uses softners as well as suppository daily.   Started senna S at supper followed by suppository in am.  14. Right spastic hemiparesis:   Baclofen 5 TID. RLE with flexor spasm will increase dose to 10mg  TID  Resting hand splint and R-AFO, discussed with nursing bracing at night.  ROM with therapy.   continue stretching,positioning    Will likely need Botox as outpt 15. Hypokalemia: improved  K+ 4.2 today  Mg+ 2.1 on 1/19  IVF with K+ qHS, d/ced on 1/31    16. Hypoalbuminemia  Supplement initiated 1/13 17. CKD III IVF qHS started 1/16,  changed to NS, d/ced on 1/31.   BUN/Creat stable off IVF  I personally reviewed the patient's labs today.  18. Leukocytosis- Resolved  WBCs 7.9  -I personally reviewed the patient's labs today.   Afebrile  CXR reviewed, unremarkable for infectious process, small left pleural effusion 19. Hyponatremia:   improved  Cont to monitor      LOS (Days) 22 A FACE TO FACE EVALUATION WAS PERFORMED  Darik Massing E 03/22/2016 6:12 AM

## 2016-03-22 NOTE — Progress Notes (Signed)
Physical Therapy Session Note  Patient Details  Name: Christina Bradshaw MRN: [id number] Date of Birth: [birth year]  Today's Date: 03/22/2016 PT Individual Time: 2417-5301 PT Individual Time Calculation (min): 29 min   Short Term Goals: Week 3:  PT Short Term Goal 1 (Week 3): Pt will be able to perform basic transfers with mod assist PT Short Term Goal 1 - Progress (Week 3): Progressing toward goal PT Short Term Goal 2 (Week 3): Pt will be able to perform sit <> stands with mod assist PT Short Term Goal 2 - Progress (Week 3): Progressing toward goal PT Short Term Goal 3 (Week 3): Pt will be able to gait x 25' with mod assist PT Short Term Goal 3 - Progress (Week 3): Progressing toward goal PT Short Term Goal 4 (Week 3): Family/caregivers will begin hands-on family education PT Short Term Goal 4 - Progress (Week 3): Not met  Skilled Therapeutic Interventions/Progress Updates:   Pt received sitting in WC and agreeable to PT  WC mobility with L hemi technique and supervision assist from PT x 178f. Min cues for awareness of obstacles on the R.   Gait at rail in hall x 242fwith max assist for RLE limb advancement and mod assist to stabilize knee in stance and prevent buckling.   PT instructed patient in Stair training on 3 inch step x 3 with max assist and LUE support on Rail. Max multimodal cues for step to gait pattern, improved activation of the R quads and glutes to improve ability to maintain standing and advance LLE.   [facility] to [facility] and all needs met.        Therapy Documentation Precautions:  Precautions Precautions: Fall Precaution Comments: dense Rt hemiplegia Restrictions Weight Bearing Restrictions: No General:   Vital Signs: Therapy Vitals Temp: 97.8 F (36.6 C) Temp Source: Oral Pulse Rate: (!) 58 Resp: 18 BP: (!) 107/51 Patient Position (if appropriate): Sitting Oxygen Therapy SpO2: 98 % O2 Device:  Not Delivered Pain: Pain Assessment Pain Assessment: No/denies pain  See Function Navigator for Current Functional Status.   Therapy/Group: Individual Therapy  AuLorie Phenix/04/2016, 2:16 PM

## 2016-03-22 NOTE — Progress Notes (Signed)
Occupational Therapy Session Note  Patient Details  Name: Christina Bradshaw MRN: [id number] Date of Birth: [birth year]  Today's Date: 03/22/2016 OT Individual Time: 1749-4496 OT Individual Time Calculation (min): 49 min    Short Term Goals: Week 1:  OT Short Term Goal 1 (Week 1): Pt will complete sit > stand with max assist to complete LB dressing/hygiene OT Short Term Goal 1 - Progress (Week 1): Progressing toward goal OT Short Term Goal 2 (Week 1): Pt will complete toilet transfers with max assist 3 out of 4 attempts OT Short Term Goal 2 - Progress (Week 1): Not met OT Short Term Goal 3 (Week 1): Pt will don pants with max assist of one caregiver OT Short Term Goal 3 - Progress (Week 1): Progressing toward goal OT Short Term Goal 4 (Week 1): Pt will position RUE during self-care tasks with min cues to increase awareness of positioning and decrease injury to shoulder OT Short Term Goal 4 - Progress (Week 1): Not met Week 2:  OT Short Term Goal 1 (Week 2): Pt will complete LB dressing with Max A (1 helper) OT Short Term Goal 1 - Progress (Week 2): Met OT Short Term Goal 2 (Week 2): Pt will complete bathing with Min A and AE PRN OT Short Term Goal 2 - Progress (Week 2): Progressing toward goal OT Short Term Goal 3 (Week 2): Pt will complete toilet transfers with Max A and LRAD  OT Short Term Goal 3 - Progress (Week 2): Met OT Short Term Goal 4 (Week 2): Pt will utilize R UE as stabilizer for grooming tasks with min cues  OT Short Term Goal 4 - Progress (Week 2): Progressing toward goal Week 3:  OT Short Term Goal 1 (Week 3): STGs=LTGs secondary to upcoming discharge  Skilled Therapeutic Interventions/Progress Updates:  Pt was lying in bed at time of arrival, requesting to get dressed. Tx focus on functional transfers, sit<stands, and hemi dressing skills. Supine<sit completed with extra time and Min A today for stabilizing trunk at EOB. Slideboard transfer completed from EOB<w/c with Max A.  Pt completed UB/LB dressing w/c level at sink. Pt required mod cues for adaptive techniques, donned button up shirt with Mod A and pants with Max A. Pt able to cross right leg over left knee but required therapist support to stabilize to thread pant leg. Sit<stand during LB dressing completed with Max A with cues for anterior weight shift and manual facilitation of R UE weight bearing on sink. Pt able to stand for 2 minutes before fatiguing and needing to sit. Oral care/grooming tasks completed with max cues to integrate R UE as stabilizer. Pt was then left with family and all needs within reach at time of departure.      Therapy Documentation Precautions:  Precautions Precautions: Fall Precaution Comments: dense Rt hemiplegia Restrictions Weight Bearing Restrictions: No General:   Vital Signs: Therapy Vitals Temp: 97.8 F (36.6 C) Temp Source: Oral Pulse Rate: (!) 58 Resp: 18 BP: (!) 107/51 Patient Position (if appropriate): Sitting Oxygen Therapy SpO2: 98 % O2 Device: Not Delivered Pain: No c/o pain during session  Pain Assessment Pain Assessment: No/denies pain ADL:  :    See Function Navigator for Current Functional Status.   Therapy/Group: Individual Therapy  Laylah Riga A Kim Oki 03/22/2016, 12:36 PM

## 2016-03-23 ENCOUNTER — Inpatient Hospital Stay (HOSPITAL_COMMUNITY): Payer: Medicare Other | Admitting: Occupational Therapy

## 2016-03-23 MED ORDER — DOCUSATE SODIUM 50 MG/5ML PO LIQD
100.0000 mg | Freq: Two times a day (BID) | ORAL | Status: DC
  Administered 2016-03-23 – 2016-03-26 (×7): 100 mg via ORAL
  Filled 2016-03-23 (×7): qty 10

## 2016-03-23 NOTE — Progress Notes (Signed)
Occupational Therapy Session Note  Patient Details  Name: Christina Bradshaw MRN: [id number] Date of Birth: 1924-07-27  Today's Date: 03/23/2016 OT Individual Time: 0906-1003 OT Individual Time Calculation (min): 57 min    Skilled Therapeutic Interventions/Progress Updates: Pt was lying in bed with nurse tech at time of arrival, agreeable to session. Tx focus on ADL retraining, safe positioning of R UE during sit<stand transitions, and hemi dressing skills. Pt completed supine<sit with extra time and Min A with HOB elevated. Squat pivot<bed<w/c completed with Max A and manual facilitation of R UE/LE placement, as well as weight shifting. Once in w/c pt completed UB ADLs, Min A for UB bathing and supervision for min instruction on adaptive techniques when donning shirt. Mod A for LB bathing with pt able to wash right foot today. Pt able to thread both legs into pants! Instruction on ankle over knee strategies provided for success.  Attempted to have pt don sock with one handed technique but she still required assistance for footwear. Max A all sit<stands with R knee blocked and facilitation of R UE weight bearing on sink. Standing tolerance has improved with pt able to stand for 3 minutes today. Afterwards she was returned to w/c and left with RN at time of departure.      Therapy Documentation Precautions:  Precautions Precautions: Fall Precaution Comments: dense Rt hemiplegia Restrictions Weight Bearing Restrictions: No General:   Vital Signs: Therapy Vitals Pulse Rate: 74 BP: (!) 172/63 Pain: No c/o pain  Pain Assessment Pain Assessment: No/denies pain ADL:      See Function Navigator for Current Functional Status.   Therapy/Group: Individual Therapy  Christina Bradshaw 03/23/2016, 12:29 PM

## 2016-03-23 NOTE — Progress Notes (Signed)
[place] [facility]ICINE & REHABILITATION     PROGRESS NOTE  Subjective/Complaints:  Pt feels constipated no BM since 2/1 Leg spasms improved  ROS: pt denies nausea, vomiting, diarrhea, cough, shortness of breath or chest pain   Objective: Vital Signs: Blood pressure (!) 142/68, pulse (!) 59, temperature 98.2 F (36.8 C), temperature source Oral, resp. rate 16, height 5\' 7"  (1.702 m), weight 59.1 kg (130 lb 4.7 oz), SpO2 98 %. Dg Swallowing Func-speech Pathology  Result Date: 03/21/2016 Objective Swallowing Evaluation: Type of Study: MBS-Modified Barium Swallow Study Patient Details Name: Christina Bradshaw MRN: [id number] Date of Birth: 10-18-1924 Today's Date: 03/21/2016 Time: SLP Start Time (ACUTE ONLY): 0900-SLP Stop Time (ACUTE ONLY): 0925 SLP Time Calculation (min) (ACUTE ONLY): 25 min Past Medical History: Past Medical History: Diagnosis Date . Hypertension  . Stroke [facility])  Past Surgical History: Past Surgical History: Procedure Laterality Date . ABDOMINAL HYSTERECTOMY   HPI: See H&P Assessment / Plan / Recommendation CHL IP CLINICAL IMPRESSIONS 03/21/2016 Therapy Diagnosis Moderate oral phase dysphagia;Mild pharyngeal phase dysphagia Clinical Impression Patient demonstrates a moderate oral phase dysphagia and mild pharyngeal phase dysphagia. Oral phase is characterized by prolonged AP transit with anterior spillage with all liquids and oral residue with all consistencies. Pharyngeal phase is characterized by delayed swallow initiation to the valleculae and decreased base of tongue retraction resulting in mild vallecular residue with all consistencies.  Patient demonstrated intermittent sensed aspiration with thin liquids via cup due to bolus size. However, aspiration was eliminated with small, single sips via straw (to facilitate chin tuck) with the use of a chin tuck that patient was able to utilize with intermittent verbal cues.  A cued second swallow as also successful in reducing vallecular  residue. Recommend patient continue Dys. 1 textures but upgrade to thin liquids via straw with the use of a chin tuck and multiple swallows. Patient will also require full supervision to maximize utilization of strategies.  Educated patient on results and recommendations, she verbalized understanding.  Impact on safety and function Moderate aspiration risk   CHL IP TREATMENT RECOMMENDATION 03/21/2016 Treatment Recommendations Therapy as outlined in treatment plan below   Prognosis 03/21/2016 Prognosis for Safe Diet Advancement Good Barriers to Reach Goals -- Barriers/Prognosis Comment -- [facility]L IP DIET RECOMMENDATION 03/21/2016 SLP Diet Recommendations Dysphagia 1 (Puree) solids;Thin liquid Liquid Administration via Straw Medication Administration Whole meds with puree Compensations Minimize environmental distractions;Slow rate;Small sips/bites;Monitor for anterior loss;Chin tuck;Use straw to facilitate chin tuck;Multiple dry swallows after each bite/sip Postural Changes Seated upright at 90 degrees   CHL IP OTHER RECOMMENDATIONS 03/21/2016 Recommended Consults -- Oral Care Recommendations Oral care BID Other Recommendations --   CHL IP FOLLOW UP RECOMMENDATIONS 03/21/2016 Follow up Recommendations 24 hour supervision/assistance;Skilled Nursing facility   [facility] IP FREQUENCY AND DURATION 03/21/2016 Speech Therapy Frequency (ACUTE ONLY) min 5x/week Treatment Duration 2 weeks      CHL IP ORAL PHASE 03/21/2016 Oral Phase Impaired Oral - Pudding Teaspoon -- Oral - Pudding Cup -- Oral - Honey Teaspoon -- Oral - Honey Cup -- Oral - Nectar Teaspoon -- Oral - Nectar Cup Delayed oral transit;Right anterior bolus loss Oral - Nectar Straw -- Oral - Thin Teaspoon Left anterior bolus loss;Delayed oral transit Oral - Thin Cup Right anterior bolus loss;Delayed oral transit Oral - Thin Straw Right anterior bolus loss;Delayed oral transit Oral - Puree Right anterior bolus loss;Delayed oral transit Oral - Mech Soft -- Oral - Regular -- Oral -  Multi-Consistency -- Oral - Pill --  Oral Phase - Comment Oral residue with all consistencies   CHL IP PHARYNGEAL PHASE 03/21/2016 Pharyngeal Phase Impaired Pharyngeal- Pudding Teaspoon -- Pharyngeal -- Pharyngeal- Pudding Cup -- Pharyngeal -- Pharyngeal- Honey Teaspoon -- Pharyngeal -- Pharyngeal- Honey Cup -- Pharyngeal -- Pharyngeal- Nectar Teaspoon -- Pharyngeal -- Pharyngeal- Nectar Cup Delayed swallow initiation-vallecula;Reduced tongue base retraction;Pharyngeal residue - valleculae;Compensatory strategies attempted (with notebox) Pharyngeal -- Pharyngeal- Nectar Straw -- Pharyngeal -- Pharyngeal- Thin Teaspoon Delayed swallow initiation-vallecula;Reduced tongue base retraction;Pharyngeal residue - valleculae;Compensatory strategies attempted (with notebox) Pharyngeal -- Pharyngeal- Thin Cup Delayed swallow initiation-vallecula;Pharyngeal residue - valleculae;Penetration/Aspiration during swallow;Compensatory strategies attempted (with notebox) Pharyngeal Material enters airway, passes BELOW cords then ejected out Pharyngeal- Thin Straw Delayed swallow initiation-vallecula;Compensatory strategies attempted (with notebox) Pharyngeal -- Pharyngeal- Puree Delayed swallow initiation-vallecula;Pharyngeal residue - valleculae;Compensatory strategies attempted (with notebox) Pharyngeal -- Pharyngeal- Mechanical Soft -- Pharyngeal -- Pharyngeal- Regular -- Pharyngeal -- Pharyngeal- Multi-consistency -- Pharyngeal -- Pharyngeal- Pill -- Pharyngeal -- Pharyngeal Comment cued second swallow reduced/eliminated valleculae residue. Thin liquids via straw to facilitate chin tuck successful in eliminating aspiration   CHL IP CERVICAL ESOPHAGEAL PHASE 03/21/2016 Cervical Esophageal Phase WFL Pudding Teaspoon -- Pudding Cup -- Honey Teaspoon -- Honey Cup -- Nectar Teaspoon -- Nectar Cup -- Nectar Straw -- Thin Teaspoon -- Thin Cup -- Thin Straw -- Puree -- Mechanical Soft -- Regular -- Multi-consistency -- Pill -- Cervical  Esophageal Comment -- No flowsheet data found. PAYNE, COURTNEY 03/21/2016, 9:40 AM               Recent Labs  03/21/16 0514  WBC 7.9  HGB 12.3  HCT 37.7  PLT 243    Recent Labs  03/21/16 0514  NA 136  K 4.2  CL 106  GLUCOSE 116*  BUN 29*  CREATININE 1.19*  CALCIUM 8.6*   CBG (last 3)  No results for input(s): GLUCAP in the last 72 hours.  Wt Readings from Last 3 Encounters:  03/12/16 59.1 kg (130 lb 4.7 oz)  02/27/16 66.5 kg (146 lb 9.7 oz)  10/25/14 67.6 kg (149 lb)    Physical Exam:  BP (!) 142/68 (BP Location: Left Arm)   Pulse (!) 59   Temp 98.2 F (36.8 C) (Oral)   Resp 16   Ht 5\' 7"  (1.702 m)   Wt 59.1 kg (130 lb 4.7 oz)   SpO2 98%   BMI 20.41 kg/m  Constitutional: She appears well-developed and well-nourished.  HENT: Normocephalic and atraumatic.  Eyes: EOMI. No discharge.  Cardiovascular: RRR without JVD Respiratory: CTA B/L. GI: Soft. Bowel sounds are normal.  Musculoskeletal: She exhibits no edema. No tenderness.  Neurological: She is alert.  Right facial weakness with severe dysarthria Able to follow basic commands Motor: RUE 0/5 prox to distal no change RLE: 2-/5 HF, 2-/5 KE, 1+/5 ADF/DP.  mAS: 1/4 elbow flexors, wrist extensors, knee flexors, 3 at R pectoralis Skin: Skin is warm and dry. Intact. Psychiatric: Flat affect.   Assessment/Plan: 1. Functional deficits secondary to left corona radiata,putamen,caudate infarct which require 3+ hours per day of interdisciplinary therapy in a comprehensive inpatient rehab setting. Physiatrist is providing close team supervision and 24 hour management of active medical problems listed below. Physiatrist and rehab team continue to assess barriers to discharge/monitor patient progress toward functional and medical goals.  Function:  Bathing Bathing position Bathing activity did not occur: Refused Position: Shower  Bathing parts Body parts bathed by patient: Right arm, Chest, Abdomen, Front perineal  area, Right upper leg, Right lower leg,  Left upper leg Body parts bathed by helper: Left arm, Buttocks, Back  Bathing assist Assist Level: More than reasonable time, Touching or steadying assistance(Pt > 75%)      Upper Body Dressing/Undressing Upper body dressing   What is the patient wearing?: Button up shirt     Pull over shirt/dress - Perfomed by patient: Thread/unthread right sleeve, Thread/unthread left sleeve, Put head through opening, Pull shirt over trunk Pull over shirt/dress - Perfomed by helper: Thread/unthread right sleeve, Put head through opening Button up shirt - Perfomed by patient: Thread/unthread right sleeve, Thread/unthread left sleeve Button up shirt - Perfomed by helper: Pull shirt around back, Button/unbutton shirt    Upper body assist Assist Level:  (Mod A)      Lower Body Dressing/Undressing Lower body dressing Lower body dressing/undressing activity did not occur: N/A What is the patient wearing?: Pants, Non-skid slipper socks     Pants- Performed by patient: Thread/unthread left pants leg Pants- Performed by helper: Thread/unthread right pants leg, Pull pants up/down   Non-skid slipper socks- Performed by helper: Don/doff right sock, Don/doff left sock       Shoes - Performed by helper: Don/doff right shoe, Don/doff left shoe, Fasten right, Fasten left          Lower body assist Assist for lower body dressing:  (Max A)      Toileting Toileting Toileting activity did not occur: No continent bowel/bladder event Toileting steps completed by patient: Adjust clothing after toileting, Adjust clothing prior to toileting Toileting steps completed by helper: Adjust clothing prior to toileting, Performs perineal hygiene, Adjust clothing after toileting Toileting Assistive Devices: Grab bar or rail  Toileting assist Assist level: Touching or steadying assistance (Pt.75%)   Transfers Chair/bed transfer Chair/bed transfer activity did not occur:  N/A Chair/bed transfer method: Lateral scoot Chair/bed transfer assist level: Maximal assist (Pt 25 - 49%/lift and lower) Chair/bed transfer assistive device: Armrests Mechanical lift: Landscape architect     Max distance: 25 Assist level: Maximal assist (Pt 25 - 49%)   Wheelchair   Type: Manual Max wheelchair distance: 173ft  Assist Level: Supervision or verbal cues  Cognition Comprehension Comprehension assist level: Understands basic 90% of the time/cues < 10% of the time  Expression Expression assist level: Expresses basic 50 - 74% of the time/requires cueing 25 - 49% of the time. Needs to repeat parts of sentences.  Social Interaction Social Interaction assist level: Interacts appropriately 75 - 89% of the time - Needs redirection for appropriate language or to initiate interaction.  Problem Solving Problem solving assist level: Solves basic 50 - 74% of the time/requires cueing 25 - 49% of the time  Memory Memory assist level: Recognizes or recalls 50 - 74% of the time/requires cueing 25 - 49% of the time    Medical Problem List and Plan: 1. Right hemiparesis and dysarthria/dysphagia secondary to left corona radiata,putamen,caudate infarct   Cont CIR therapies, plan for SNF 2. DVT Prophylaxis/Anticoagulation: Pharmaceutical: Lovenox  3. Pain Management: tylenol prn   Robaxin 500 TID PRN   Neurontin started 1/23  4. Mood:   Has history of anxiety--appears controlled at present  Started prozac to help with mood and recovery.   LCSW to follow for evaluation and support.  5. Neuropsych: This patient is not fully capable of making decisions on her own behalf.  6. Skin/Wound Care: Routine pressure relief measures. Maintain adequate nutritional and hydration status.  7. Fluids/Electrolytes/Nutrition: Monitor I/O. Supplements between meals.  8. HTN:  Norvasc increased to 10mg  on 1/15  Hydralazine  increased to 25 on 1/18   Cont lisinopril and metoprolol bid.     Vitals:   03/22/16 1934 03/23/16 0524  BP: (!) 153/66 (!) 142/68  Pulse: 67 (!) 59  Resp: 18 16  Temp:  98.2 F (36.8 C)   9. Dysphagia: Dysphagia 1, advanced to nectar liquids. Offer fluids between meals to maintain adequate hydration.   -MBS this morning 10. Dyslipidemia: on lipitor  11. Prediabetes: Hgb A1c- 6.0.   Modified diet to HH/CM. RD to educate patient and family on appropriate diet.   CBGs no longer being checked 12. GERD: Managed On Protonix.  13. Chronic constipation: Uses softners as well as suppository daily.   Started senna S at supper followed by suppository in am.  14. Right spastic hemiparesis:   Baclofen 5 TID. RLE with flexor spasm will increase dose to 10mg  TID, improved  Resting hand splint and R-AFO, discussed with nursing bracing at night.  ROM with therapy.   continue stretching,positioning    Will likely need Botox as outpt 15. Hypokalemia: improved  K+ 4.2 today  Mg+ 2.1 on 1/19  IVF with K+ qHS, d/ced on 1/31    16. Hypoalbuminemia  Supplement initiated 1/13 17. CKD III IVF qHS started 1/16,  changed to NS, d/ced on 1/31.   BUN/Creat stable off IVF, enc po fluid to maintain  18.  Constipation dulc supp tonite if no BM today, increase colace to BID       LOS (Days) 23 A FACE TO FACE EVALUATION WAS PERFORMED  Erick ColaceKIRSTEINS,ANDREW E 03/23/2016 6:47 AM

## 2016-03-24 ENCOUNTER — Inpatient Hospital Stay (HOSPITAL_COMMUNITY): Payer: Medicare Other | Admitting: Speech Pathology

## 2016-03-24 ENCOUNTER — Inpatient Hospital Stay (HOSPITAL_COMMUNITY): Payer: Medicare Other | Admitting: Physical Therapy

## 2016-03-24 ENCOUNTER — Inpatient Hospital Stay (HOSPITAL_COMMUNITY): Payer: Medicare Other | Admitting: Occupational Therapy

## 2016-03-24 DIAGNOSIS — K5901 Slow transit constipation: Secondary | ICD-10-CM

## 2016-03-24 LAB — BASIC METABOLIC PANEL
Anion gap: 9 (ref 5–15)
BUN: 37 mg/dL — ABNORMAL HIGH (ref 6–20)
CO2: 23 mmol/L (ref 22–32)
CREATININE: 1.06 mg/dL — AB (ref 0.44–1.00)
Calcium: 8.7 mg/dL — ABNORMAL LOW (ref 8.9–10.3)
Chloride: 104 mmol/L (ref 101–111)
GFR, EST AFRICAN AMERICAN: 52 mL/min — AB (ref >60)
GFR, EST NON AFRICAN AMERICAN: 45 mL/min — AB (ref >60)
Glucose, Bld: 81 mg/dL (ref 65–99)
Potassium: 4.9 mmol/L (ref 3.5–5.1)
Sodium: 136 mmol/L (ref 135–145)

## 2016-03-24 MED ORDER — ENSURE ENLIVE PO LIQD
237.0000 mL | Freq: Three times a day (TID) | ORAL | Status: DC
  Administered 2016-03-24 – 2016-03-26 (×6): 237 mL via ORAL

## 2016-03-24 MED ORDER — ENSURE ENLIVE PO LIQD: 237.0000 mL | Freq: Two times a day (BID) | ORAL | Status: DC

## 2016-03-24 NOTE — Progress Notes (Signed)
Occupational Therapy Session Note  Patient Details  Name: Denita LungMildred C Sligh MRN: [id number] Date of Birth: [birth year]  Today's Date: 03/24/2016 OT Individual Time: [id number] OT Individual Time Calculation (min): 72 min    Short Term Goals: Week 3:  OT Short Term Goal 1 (Week 3): STGs=LTGs secondary to upcoming discharge   Skilled Therapeutic Interventions/Progress Updates:    Upon entering the room, pt supine in bed with no c/o pain this session. Pt agreeable to OT intervention. Min A for R LE for supine >sit. Pt requested sitting EOB for dressing and bathing task. Pt required max cues for initiation and sequencing of tasks this session. Pt's brief was wet and therefore OT assisted pt with hygiene. However, during this time pt began to void urine on floor before therapist was able to finish assisting with LB self care. Pt was unaware until urine was unaware until accident had already occurred. Sit <>Stand with max A to pull pants over R hips with pt assisting with L. Stand pivot transfer from this position into wheelchair with max A for lifting and lowering assistance. Pt engaged in grooming tasks at sink with set up A. Min A needed to clean dentures. Pt remained in wheelchair with R UE tray donned and call bell within reach upon exiting the room.   Therapy Documentation Precautions:  Precautions Precautions: Fall Precaution Comments: dense Rt hemiplegia Restrictions Weight Bearing Restrictions: No General:   Vital Signs:  Pain: Pain Assessment Pain Assessment: 0-10 Pain Score: 0-No pain ADL:   Exercises:   Other Treatments:    See Function Navigator for Current Functional Status.   Therapy/Group: Individual Therapy  [name], Lashondra Vaquerano P 03/24/2016, 1:35 PM

## 2016-03-24 NOTE — Progress Notes (Signed)
[place] PHYSICAL MEDICINE & REHABILITATION     PROGRESS NOTE  Subjective/Complaints:  Pt sitting up in bed eating breakfast.  She states she slept well overnight and had a good weekend.  She notes improvement in leg pain and spasms.    ROS: Denies nausea, vomiting, diarrhea, shortness of breath or chest pain  Objective: Vital Signs: Blood pressure (!) 143/60, pulse (!) 59, temperature 97.8 F (36.6 C), temperature source Oral, resp. rate 16, height 5\' 7"  (1.702 m), weight 59.1 kg (130 lb 4.7 oz), SpO2 96 %. No results found. No results for input(s): WBC, HGB, HCT, PLT in the last 72 hours.  Recent Labs  03/24/16 0258  NA 136  K 4.9  CL 104  GLUCOSE 81  BUN 37*  CREATININE 1.06*  CALCIUM 8.7*   CBG (last 3)  No results for input(s): GLUCAP in the last 72 hours.  Wt Readings from Last 3 Encounters:  03/12/16 59.1 kg (130 lb 4.7 oz)  02/27/16 66.5 kg (146 lb 9.7 oz)  10/25/14 67.6 kg (149 lb)    Physical Exam:  BP (!) 143/60 (BP Location: Left Arm)   Pulse (!) 59   Temp 97.8 F (36.6 C) (Oral)   Resp 16   Ht 5\' 7"  (1.702 m)   Wt 59.1 kg (130 lb 4.7 oz)   SpO2 96%   BMI 20.41 kg/m  Constitutional: She appears well-developed and well-nourished.  HENT: Normocephalic and atraumatic.  Eyes: EOMI. No discharge.  Cardiovascular: RRR without JVD Respiratory: CTA B/L. Unlabored.  GI: Soft. Bowel sounds are normal.  Musculoskeletal: She exhibits no edema. No tenderness.  Neurological: She is alert.  Right facial weakness with severe dysarthria Able to follow basic commands Motor: RUE 0/5 prox to distal (stable) RLE: 2-/5 HF, 2-/5 KE, 1+/5 ADF/DP.  mAS: right 1/4 elbow flexors, wrist extensors, knee flexors, 3 at R pectoralis, 3/4 ADF Skin: Skin is warm and dry. Intact. Psychiatric: Flat affect.   Assessment/Plan: 1. Functional deficits secondary to left corona radiata,putamen,caudate infarct which require 3+ hours per day of interdisciplinary therapy in a  comprehensive inpatient rehab setting. Physiatrist is providing close team supervision and 24 hour management of active medical problems listed below. Physiatrist and rehab team continue to assess barriers to discharge/monitor patient progress toward functional and medical goals.  Function:  Bathing Bathing position Bathing activity did not occur: Refused Position: Wheelchair/chair at sink  Bathing parts Body parts bathed by patient: Right arm, Chest, Abdomen, Front perineal area, Right upper leg, Right lower leg, Left upper leg Body parts bathed by helper: Left arm, Buttocks, Left lower leg, Back  Bathing assist Assist Level:  (Min-Mod A)      Upper Body Dressing/Undressing Upper body dressing   What is the patient wearing?: Pull over shirt/dress     Pull over shirt/dress - Perfomed by patient: Thread/unthread right sleeve, Thread/unthread left sleeve, Put head through opening, Pull shirt over trunk Pull over shirt/dress - Perfomed by helper: Thread/unthread right sleeve, Put head through opening Button up shirt - Perfomed by patient: Thread/unthread right sleeve, Thread/unthread left sleeve Button up shirt - Perfomed by helper: Pull shirt around back, Button/unbutton shirt    Upper body assist Assist Level: Supervision or verbal cues      Lower Body Dressing/Undressing Lower body dressing Lower body dressing/undressing activity did not occur: N/A What is the patient wearing?: Pants, Non-skid slipper socks     Pants- Performed by patient: Thread/unthread right pants leg, Thread/unthread left pants leg Pants- Performed  by helper: Pull pants up/down   Non-skid slipper socks- Performed by helper: Don/doff right sock, Don/doff left sock       Shoes - Performed by helper: Don/doff right shoe, Don/doff left shoe, Fasten right, Fasten left          Lower body assist Assist for lower body dressing:  (Mod-Max A)      Toileting Toileting Toileting activity did not occur: No  continent bowel/bladder event Toileting steps completed by patient: Adjust clothing after toileting, Adjust clothing prior to toileting Toileting steps completed by helper: Adjust clothing prior to toileting, Performs perineal hygiene, Adjust clothing after toileting Toileting Assistive Devices: Grab bar or rail  Toileting assist Assist level: Touching or steadying assistance (Pt.75%)   Transfers Chair/bed transfer Chair/bed transfer activity did not occur: N/A Chair/bed transfer method: Squat pivot Chair/bed transfer assist level: Maximal assist (Pt 25 - 49%/lift and lower) Chair/bed transfer assistive device: Armrests Mechanical lift: Stedy   Locomotion Ambulation     Max distance: 25 Assist level: Maximal assist (Pt 25 - 49%)   Wheelchair   Type: Manual Max wheelchair distance: 17750ft  Assist Level: Supervision or verbal cues  Cognition Comprehension Comprehension assist level: Follows basic conversation/direction with extra time/assistive device  Expression Expression assist level: Expresses basic needs/ideas: With no assist  Social Interaction Social Interaction assist level: Interacts appropriately with others with medication or extra time (anti-anxiety, antidepressant).  Problem Solving Problem solving assist level: Solves basic 50 - 74% of the time/requires cueing 25 - 49% of the time  Memory Memory assist level: Recognizes or recalls 50 - 74% of the time/requires cueing 25 - 49% of the time    Medical Problem List and Plan: 1. Right hemiparesis and dysarthria/dysphagia secondary to left corona radiata,putamen,caudate infarct   Cont CIR therapies, plan for SNF  Weekend records reviewed  No issues per on-call physician. 2. DVT Prophylaxis/Anticoagulation: Pharmaceutical: Lovenox  3. Pain Management: tylenol prn   Robaxin 500 TID PRN   Neurontin started 1/23  4. Mood:   Has history of anxiety--appears controlled at present  Started prozac to help with mood and recovery.    LCSW to follow for evaluation and support.  5. Neuropsych: This patient is not fully capable of making decisions on her own behalf.  6. Skin/Wound Care: Routine pressure relief measures. Maintain adequate nutritional and hydration status.  7. Fluids/Electrolytes/Nutrition: Monitor I/O. Supplements between meals.  8. HTN:  Norvasc increased to 10mg  on 1/15  Hydralazine  increased to 25 on 1/18   Cont lisinopril and metoprolol bid.   Controlled 2/5   Vitals:   03/23/16 2035 03/24/16 0443  BP: 125/60 (!) 143/60  Pulse: 63 (!) 59  Resp:  16  Temp:  97.8 F (36.6 C)   9. Dysphagia: Dysphagia 1, thin . Offer fluids between meals to maintain adequate hydration.  10. Dyslipidemia: on lipitor  11. Prediabetes: Hgb A1c- 6.0.   Modified diet to HH/CM. RD to educate patient and family on appropriate diet.  12. GERD: Managed On Protonix.  13. Chronic constipation: Uses softners as well as suppository daily.   Started senna S at supper followed by suppository in am.  14. Right spastic hemiparesis:   Baclofen 5 TID.   Resting hand splint and R-AFO, discussed with nursing bracing at night.  ROM with therapy.   continue stretching,positioning    Will likely need Botox as outpt 15. Hypokalemia: improved  K+ 4.9 on 2/5  Mg+ 2.1 on 1/19  IVF with K+ qHS,  d/ced on 1/31 16. Hypoalbuminemia  Supplement initiated 1/13 17. CKD III   IVF qHS started 1/16,  changed to NS, d/ced on 1/31.   BUN/Creat stable off IVF, enc po fluid to maintain 18.  Constipation   Increased bowel reg  Improved  LOS (Days) 24 A FACE TO FACE EVALUATION WAS PERFORMED  Ankit Karis Juba 03/24/2016 8:12 AM

## 2016-03-24 NOTE — Progress Notes (Signed)
Physical Therapy Session Note  Patient Details  Name: Christina Bradshaw MRN: [id number] Date of Birth: [birth year]  Today's Date: 03/24/2016 PT Individual Time: 1056-1205 1400-1430 PT Individual Time Calculation (min): 69 min and 30 min  Short Term Goals: Week 4:  PT Short Term Goal 1 (Week 4): = LTGs due to LOS -D/c plan for SNF  Skilled Therapeutic Interventions/Progress Updates: Tx1: Pt presented in w/c agreeable to therapy. Donned shoes totalA, pt propelled approx 135f with supervision with cues to increase heel strike to facilitate mobility. Use of Kinetron for LE stregthening and facilitation of reciprocal pattern. Performed 90cm/sec x 30 sec and 14m x3 at 70cm/sec. Gait training at wall rail [id number] with PTA providing total assist to advance RLE during first trial and pt able to advance RLE on second trial with PTA adjustment of RLE. MaxA for erect posture and achieving full knee ext during stance phase. Performed SB transfer to/from mat x 4. Required modA with cues for hand placement and head/hip relationship. Pt propelled in w/c negotiating narrow spaces with min cues for technique. Pt required frequent therapeutic breaks due to fatigue. Pt returned to room and left in w/c with call bell within reach and all current needs met.     Tx2: Pt presented in w/c with son present. Propelled to rehab gym. Continued reinforcement of SB transfer.  Performed mat to/from w/c with modA,  Mod cues for wt shifting and sequencing. Improved technique with repetitions. Performed seated dynamic balance activities including ball toss with reaching outside BoS.  Pt able to maintain fair+ balance with moderate challenges. Pt returned to room and remained in w/c with half lap tray in place and son present with call bell within reach.   Therapy Documentation Precautions:  Precautions Precautions: Fall Precaution Comments: dense Rt hemiplegia Restrictions Weight Bearing Restrictions: No General:   Vital  Signs: Therapy Vitals Pulse Rate: 63 BP: (!) 146/62 Pain: Pain Assessment Pain Assessment: 0-10 Pain Score: 0-No pain   See Function Navigator for Current Functional Status.   Therapy/Group: Individual Therapy  Cashlynn Yearwood  Laticha Ferrucci, PTA  03/24/2016, 12:20 PM

## 2016-03-24 NOTE — Progress Notes (Signed)
Speech Language Pathology Daily Session Note  Patient Details  Name: Christina Bradshaw MRN: [id number] Date of Birth: 14-Mar-1924  Today's Date: 03/24/2016 SLP Individual Time: 1300-1330 SLP Individual Time Calculation (min): 30 min  Short Term Goals: Week 4: SLP Short Term Goal 1 (Week 4): Pt will consume dysphagia 1 diet with thin liquids with Mod A verbal cues for use of compensatory swallow strategies.  SLP Short Term Goal 2 (Week 4): Pt will consume dysphagia 1 diet with thin liquids without overt s/s of aspiration.  SLP Short Term Goal 3 (Week 4): Patient will utilize speech intelligibility strategies at the phrase level with Min A multimodal cues to achieve >75% intelligibility.  SLP Short Term Goal 4 (Week 4): Patient will utilize word-finding strateiges during functional tasks/conversations with Min A verbal cues.  SLP Short Term Goal 5 (Week 4): Patient will demonstrate functional problem solving for basic and familiar tasks with Min A multimodal cues.  SLP Short Term Goal 6 (Week 4): Patient will recall new, daily information with Min A verbal cues.   Skilled Therapeutic Interventions: Skilled ST treatment session focused on dysphagia and cognition goals. SLP facilitated session by providing supervision cues to utilize swallow compensatory strategies when consuming thin liquids. Pt consumed with use of strategies with 1 cough during consumption of 8 oz thin liquids. Pt required Mod A verbal cues to increase vocal intensity at the sentence level to achieve ~75% accuracy. Pt was left in wheelchair with all needs within reach. Continue per current plan of care.      Function:  Eating Eating   Modified Consistency Diet: No (Trials of thin liquids) Eating Assist Level: Supervision or verbal cues   Eating Set Up Assist For: Opening containers       Cognition Comprehension Comprehension assist level: Follows basic conversation/direction with extra time/assistive device  Expression    Expression assist level: Expresses basic needs/ideas: With no assist  Social Interaction Social Interaction assist level: Interacts appropriately with others with medication or extra time (anti-anxiety, antidepressant).  Problem Solving Problem solving assist level: Solves basic 50 - 74% of the time/requires cueing 25 - 49% of the time  Memory Memory assist level: Recognizes or recalls 50 - 74% of the time/requires cueing 25 - 49% of the time    Pain Pain Assessment Pain Assessment: 0-10 Pain Score: 0-No pain  Therapy/Group: Individual Therapy  Myrella Fahs B. [name], M.S., CCC-SLP Speech-Language Pathologist  Renell Allum 03/24/2016, 2:04 PM

## 2016-03-25 ENCOUNTER — Inpatient Hospital Stay (HOSPITAL_COMMUNITY): Payer: Medicare Other | Admitting: Speech Pathology

## 2016-03-25 ENCOUNTER — Inpatient Hospital Stay (HOSPITAL_COMMUNITY): Payer: Medicare Other | Admitting: Occupational Therapy

## 2016-03-25 ENCOUNTER — Inpatient Hospital Stay (HOSPITAL_COMMUNITY): Payer: Medicare Other | Admitting: Physical Therapy

## 2016-03-25 MED ORDER — ENSURE ENLIVE PO LIQD: 237.0000 mL | Freq: Three times a day (TID) | ORAL | 12 refills | Status: AC

## 2016-03-25 MED ORDER — ASPIRIN 81 MG PO CHEW: 81.0000 mg | CHEWABLE_TABLET | Freq: Every day | ORAL | Status: AC

## 2016-03-25 MED ORDER — METOPROLOL TARTRATE 25 MG PO TABS: 25.0000 mg | ORAL_TABLET | Freq: Two times a day (BID) | ORAL | Status: AC

## 2016-03-25 MED ORDER — AMLODIPINE BESYLATE 10 MG PO TABS: 10.0000 mg | ORAL_TABLET | Freq: Every day | ORAL | Status: DC

## 2016-03-25 MED ORDER — TRAZODONE HCL 50 MG PO TABS: 50.0000 mg | ORAL_TABLET | Freq: Every day | ORAL | Status: AC

## 2016-03-25 MED ORDER — FLUOXETINE HCL 20 MG PO CAPS: 20.0000 mg | ORAL_CAPSULE | Freq: Every day | ORAL | 3 refills | Status: AC

## 2016-03-25 MED ORDER — ALUM & MAG HYDROXIDE-SIMETH 200-200-20 MG/5ML PO SUSP: 30.0000 mL | ORAL | 0 refills | Status: AC | PRN

## 2016-03-25 MED ORDER — MUSCLE RUB 10-15 % EX CREA: 1.0000 "application " | TOPICAL_CREAM | Freq: Three times a day (TID) | CUTANEOUS | 0 refills | Status: AC

## 2016-03-25 MED ORDER — HYDRALAZINE HCL 25 MG PO TABS: 25.0000 mg | ORAL_TABLET | Freq: Three times a day (TID) | ORAL | Status: DC

## 2016-03-25 MED ORDER — PANTOPRAZOLE SODIUM 40 MG PO PACK: 40.0000 mg | PACK | Freq: Every day | ORAL | Status: AC

## 2016-03-25 MED ORDER — DOCUSATE SODIUM 50 MG/5ML PO LIQD: 100.0000 mg | Freq: Two times a day (BID) | ORAL | 0 refills | Status: AC

## 2016-03-25 MED ORDER — BACLOFEN 10 MG PO TABS: 5.0000 mg | ORAL_TABLET | Freq: Three times a day (TID) | ORAL | Status: AC

## 2016-03-25 MED ORDER — LISINOPRIL 20 MG PO TABS: 20.0000 mg | ORAL_TABLET | Freq: Every day | ORAL | Status: AC

## 2016-03-25 MED ORDER — BISACODYL 10 MG RE SUPP: 10.0000 mg | RECTAL | 0 refills | Status: AC

## 2016-03-25 MED ORDER — ACETAMINOPHEN 325 MG PO TABS: 325.0000 mg | ORAL_TABLET | ORAL | Status: AC | PRN

## 2016-03-25 NOTE — Progress Notes (Signed)
[facility] PHYSICAL MEDICINE & REHABILITATION     PROGRESS NOTE  Subjective/Complaints:  Pt sitting up in bed this AM eating breakfast.  She states she slept well overnight.  She denies complaints of pain.    ROS: Denies nausea, vomiting, diarrhea, shortness of breath or chest pain  Objective: Vital Signs: Blood pressure (!) 153/61, pulse 66, temperature 97.8 F (36.6 C), temperature source Oral, resp. rate 17, height 5\' 7"  (1.702 m), weight 59.1 kg (130 lb 4.7 oz), SpO2 98 %. No results found. No results for input(s): WBC, HGB, HCT, PLT in the last 72 hours.  Recent Labs  03/24/16 0258  NA 136  K 4.9  CL 104  GLUCOSE 81  BUN 37*  CREATININE 1.06*  CALCIUM 8.7*   CBG (last 3)  No results for input(s): GLUCAP in the last 72 hours.  Wt Readings from Last 3 Encounters:  03/12/16 59.1 kg (130 lb 4.7 oz)  02/27/16 66.5 kg (146 lb 9.7 oz)  10/25/14 67.6 kg (149 lb)    Physical Exam:  BP (!) 153/61   Pulse 66   Temp 97.8 F (36.6 C) (Oral)   Resp 17   Ht 5\' 7"  (1.702 m)   Wt 59.1 kg (130 lb 4.7 oz)   SpO2 98%   BMI 20.41 kg/m  Constitutional: She appears well-developed and well-nourished.  HENT: Normocephalic and atraumatic.  Eyes: EOMI. No discharge.  Cardiovascular: RRR without JVD Respiratory: CTA B/L. Unlabored.  GI: Soft. Bowel sounds are normal.  Musculoskeletal: She exhibits no edema. No tenderness.  Neurological: She is alert.  Right facial weakness with severe dysarthria Able to follow basic commands Motor: RUE 0/5 prox to distal (unchanged) RLE: 2-/5 HF, 2-/5 KE, 1+/5 ADF/DP.  mAS: right 1/4 elbow flexors, wrist extensors, knee flexors, 3 at R pectoralis, 3/4 ADF Skin: Skin is warm and dry. Intact. Psychiatric: Flat affect. Normal behavior.   Assessment/Plan: 1. Functional deficits secondary to left corona radiata,putamen,caudate infarct which require 3+ hours per day of interdisciplinary therapy in a comprehensive inpatient rehab  setting. Physiatrist is providing close team supervision and 24 hour management of active medical problems listed below. Physiatrist and rehab team continue to assess barriers to discharge/monitor patient progress toward functional and medical goals.  Function:  Bathing Bathing position Bathing activity did not occur: Refused Position: Sitting EOB  Bathing parts Body parts bathed by patient: Right arm, Chest, Abdomen, Front perineal area, Right upper leg, Right lower leg, Left upper leg Body parts bathed by helper: Left arm, Buttocks, Left lower leg, Back  Bathing assist Assist Level:  (mod A)      Upper Body Dressing/Undressing Upper body dressing   What is the patient wearing?: Pull over shirt/dress     Pull over shirt/dress - Perfomed by patient: Put head through opening, Pull shirt over trunk, Thread/unthread right sleeve Pull over shirt/dress - Perfomed by helper: Thread/unthread left sleeve Button up shirt - Perfomed by patient: Thread/unthread right sleeve, Thread/unthread left sleeve Button up shirt - Perfomed by helper: Pull shirt around back, Button/unbutton shirt    Upper body assist Assist Level: Touching or steadying assistance(Pt > 75%)      Lower Body Dressing/Undressing Lower body dressing Lower body dressing/undressing activity did not occur: N/A What is the patient wearing?: Pants     Pants- Performed by patient: Thread/unthread right pants leg, Thread/unthread left pants leg Pants- Performed by helper: Pull pants up/down   Non-skid slipper socks- Performed by helper: Don/doff right sock, Don/doff left sock  Shoes - Performed by helper: Don/doff right shoe, Don/doff left shoe, Fasten right, Fasten left          Lower body assist Assist for lower body dressing:  (mod A)      Toileting Toileting Toileting activity did not occur: No continent bowel/bladder event Toileting steps completed by patient: Adjust clothing after toileting, Adjust clothing  prior to toileting Toileting steps completed by helper: Adjust clothing prior to toileting, Performs perineal hygiene, Adjust clothing after toileting Toileting Assistive Devices: Grab bar or rail  Toileting assist Assist level: Touching or steadying assistance (Pt.75%)   Transfers Chair/bed transfer Chair/bed transfer activity did not occur: N/A Chair/bed transfer method: Stand pivot Chair/bed transfer assist level: Maximal assist (Pt 25 - 49%/lift and lower) Chair/bed transfer assistive device: Armrests Mechanical lift: Stedy   Locomotion Ambulation     Max distance: 25 Assist level: Maximal assist (Pt 25 - 49%)   Wheelchair   Type: Manual Max wheelchair distance: [id number]  Assist Level: Supervision or verbal cues  Cognition Comprehension Comprehension assist level: Follows basic conversation/direction with extra time/assistive device  Expression Expression assist level: Expresses basic needs/ideas: With no assist  Social Interaction Social Interaction assist level: Interacts appropriately with others with medication or extra time (anti-anxiety, antidepressant).  Problem Solving Problem solving assist level: Solves basic 50 - 74% of the time/requires cueing 25 - 49% of the time  Memory Memory assist level: Recognizes or recalls 50 - 74% of the time/requires cueing 25 - 49% of the time    Medical Problem List and Plan: 1. Right hemiparesis and dysarthria/dysphagia secondary to left corona radiata,putamen,caudate infarct   Cont CIR therapies, plan for SNF 2. DVT Prophylaxis/Anticoagulation: Pharmaceutical: Lovenox  3. Pain Management: tylenol prn   Robaxin 500 TID PRN  Neurontin started 1/23  4. Mood:   Has history of anxiety--appears controlled at present  Started prozac to help with mood and recovery.   LCSW to follow for evaluation and support.  5. Neuropsych: This patient is not fully capable of making decisions on her own behalf.  6. Skin/Wound Care: Routine pressure  relief measures. Maintain adequate nutritional and hydration status.  7. Fluids/Electrolytes/Nutrition: Monitor I/O. Supplements between meals.  8. HTN:  Norvasc increased to 10mg  on 1/15  Hydralazine  increased to 25 on 1/18   Cont lisinopril and metoprolol bid.   Trending up? 2/5, will cont to monitor 9. Dysphagia: Dysphagia 1, thin. Offer fluids between meals to maintain adequate hydration.  10. Dyslipidemia: on lipitor  11. Prediabetes: Hgb A1c- 6.0.   Modified diet to HH/CM. RD to educate patient and family on appropriate diet.  12. GERD: Managed On Protonix.  13. Chronic constipation: Uses softners as well as suppository daily.   Cont bowel reg.  14. Right spastic hemiparesis:   Baclofen 5 TID.   Resting hand splint and R-AFO, discussed with nursing bracing at night.  ROM with therapy.   continue stretching,positioning    May need Botox as outpt 15. Hypokalemia: improved  K+ 4.9 on 2/5  Mg+ 2.1 on 1/19  IVF with K+ qHS, d/ced on 1/31 16. Hypoalbuminemia  Supplement initiated 1/13 17. CKD III   IVF qHS started 1/16,  changed to NS, d/ced on 1/31.   BUN/Creat stable off IVF, enc po fluid to maintain 18.  Constipation   Increased bowel reg  Improved  LOS (Days) 25 A FACE TO FACE EVALUATION WAS PERFORMED  Donita Newland Karis Jubanil Fawaz Borquez 03/25/2016 8:18 AM

## 2016-03-25 NOTE — Progress Notes (Signed)
Occupational Therapy Session Note  Patient Details  Name: Christina Bradshaw MRN: [id number] Date of Birth: 11-20-1924  Today's Date: 03/25/2016 OT Individual Time: 7829-56211030-1114 OT Individual Time Calculation (min): 44 min    Short Term Goals: Week 3:  OT Short Term Goal 1 (Week 3): STGs=LTGs secondary to upcoming discharge   Skilled Therapeutic Interventions/Progress Updates:    Upon entering entering the room, pt seated in wheelchair awaiting therapist with no c/o pain this session. Pt propelled wheelchair 100' towards day room with hemiplegic technique. Pt performed sit <>stand with max A from wheelchair. OT placing R UE into weight bearing position on table. Pt utilized 4 step sequencing cards for cooking an egg, making a bed, cooking soup, and calling refill for medications. Pt needing min verbal guidance cues for sequencing. OT blocking R knee in standing and providing manual facilitation for upright posture. Pt standing x 2 reps for 6 minutes and 8 minutes respectively. Pt needing max cues for sequencing of sit <>stand for safety. Pt continues to need max cues to safety with R UE during functional transfers as well. Pt propelled self back to room in same manner as above. Call bell and all needed items within reach upon exiting the room.   Therapy Documentation Precautions:  Precautions Precautions: Fall Precaution Comments: dense Rt hemiplegia Restrictions Weight Bearing Restrictions: No  Pain: Pain Assessment Pain Assessment: 0-10 Pain Score: 0-No pain ADL:   Exercises:   Other Treatments:    See Function Navigator for Current Functional Status.   Therapy/Group: Individual Therapy  Christina BleacherBradsher, Christina Bradshaw P 03/25/2016, 12:35 PM

## 2016-03-25 NOTE — Discharge Summary (Signed)
Physician Discharge Summary  Patient ID: Christina Bradshaw MRN: [id number] DOB/AGE: [birth year] 83 y.o.  Admit date: 02/29/2016 Discharge date: 03/26/2016  Discharge Diagnoses:  Principal Problem:   Left sided cerebral hemisphere cerebrovascular accident (CVA) (HCC) Active Problems:   Spastic hemiparesis of right dominant side due to cerebral infarction [facility])   Dysphagia   Essential hypertension   Prediabetes   Hypoalbuminemia due to protein-calorie malnutrition (HCC)   Stage 3 chronic kidney disease   AKI (acute kidney injury) (HCC)   Acute lower UTI   Neuropathic pain   Slow transit constipation   Discharged Condition: stable.   Significant Diagnostic Studies: Dg Chest 2 View  Result Date: 03/10/2016 CLINICAL DATA:  Elevated white blood cell count. Malaise since having a CVA 2 weeks ago. EXAM: CHEST  2 VIEW COMPARISON:  PA and lateral chest x-ray of February 26, 2016 FINDINGS: The lungs remain hyper inflated. There is a small amount of pleural fluid has collected in the posterior costophrenic gutter on the left. The heart is top-normal in size. The pulmonary vascularity is normal. The mediastinum is normal in width. There is calcification in the wall of the thoracic aorta. There is multilevel degenerative disc disease of the thoracic spine. IMPRESSION: Small left pleural effusion layering posteriorly. No alveolar pneumonia nor pulmonary edema. Thoracic aortic atherosclerosis. Electronically Signed   By: David  [place] M.D.   On: 03/10/2016 16:59   Dg Chest 2 View  Result Date: 02/26/2016 CLINICAL DATA:  83 y/o  F; dizziness and weakness. EXAM: CHEST  2 VIEW COMPARISON:  07/10/2013 chest radiograph FINDINGS: Stable cardiac silhouette within normal limits given projection and technique. Aortic atherosclerosis with calcification. No focal consolidation of the lungs. Mild biapical pleuroparenchymal scarring. Multilevel degenerative changes of the thoracic spine. Expanded lungs with flattened  diaphragms compatible with COPD. IMPRESSION: No active cardiopulmonary disease. Aortic atherosclerosis. Findings of COPD. Electronically Signed   By: Mitzi Hansen M.D.   On: 02/26/2016 20:58    Labs:  Basic Metabolic Panel:  Recent Labs BMP Latest Ref Rng & Units 03/26/2016 03/24/2016 03/21/2016  Glucose 65 - 99 mg/dL 94 81 409(W)  BUN 6 - 20 mg/dL 11(B) 14(N) 82(N)  Creatinine 0.44 - 1.00 mg/dL 5.62 1.30(Q) 6.57(Q)  Sodium 135 - 145 mmol/L 137 136 136  Potassium 3.5 - 5.1 mmol/L 4.0 4.9 4.2  Chloride 101 - 111 mmol/L 103 104 106  CO2 22 - 32 mmol/L 24 23 21(L)  Calcium 8.9 - 10.3 mg/dL 8.9 4.6(N) 6.2(X)    CBC:  Recent Labs Lab 03/21/16 0514  WBC 7.9  NEUTROABS 5.0  HGB 12.3  HCT 37.7  MCV 94.3  PLT 243    CBG: No results for input(s): GLUCAP in the last 168 hours.  Brief HPI:   Christina Bradshaw is an 83 y.o. female with history of HTN, palpitations, who was admitted on 02/26/16 with compliants of dizziness and speech changes since awakening from that am sleep. She was evalauted at [facility] and MRI brain showed evidence of acute infarct left corona radiata extending into putamen and left caudate. MRA negative for stenosis/large vessel occlusion. She did worsening of weakness overnight with dense right hemiparesis. Dr. Thad Ranger recommends ASA and statin for stroke due to small vessel disease. Cardiac enzymes being monitored and elevation felt to be due to demand ischemia and doubt MI per reports. She was placed on dysphagia 1, nectar liquids due to signs of dysphagia. Therapy evaluations done revealing decreased balance with posterior lean and RLE instability. CIR  recommended for follow up therapy   Hospital Course: Christina Bradshaw was admitted to rehab 02/29/2016 for inpatient therapies to consist of PT, ST and OT at least three hours five days a week. Past admission physiatrist, therapy team and rehab RN have worked together to provide customized collaborative inpatient  rehab. She was maintained on low dose ASA and is tolerating this without side effects. Her diet was downgraded to honey thick liquids due to overt signs of dysphagia.  Protein supplement was added to help manage protein calorie malnutrition. She was unable to maintain adequate hydration on thickened liquids and required IVF for hydration at nights. As swallow function improved, she was advanced to thin liquids and acute on chronic renal failure is resolving with improvement in fluid intake. Recommend offer fluids thorough out the day to help maintain adequate hydration. Mild hypokalemia due to IVF has resolved with supplementation.  Blood pressures were noted to be labile and medications have been titrated upwards to help with better control. Blood pressures have been monitored on bid basis and are showing improvement. Hydralazine was further increased to 37.5 mg tid at discharge to help with better control. She has developed spasticity with pain RLE and ROM, splinting as well as baclofen was added to help with management of symptoms.   She was noted to have high levels of anxiety at admission and prozac was added to help with mood and recovery. Dr. Kieth Brightly neuropsychologist has followed for support and education on coping skills to help manage adjustment disorder with depressive features.  Po intake is improving and supplements were added between meals to help maintain adequate nutritional status. She reported history of chronic constipation requiring use of suppositories at home. Her bowel program was augmented to colace bid and suppository every other day to help with bowel function. She developed leucocytosis due to Morganella Morganii/Klebsiella Pneumoniae UTI and was treated with one week course of Septra DS.  She has been making slow steady progress but continues to be limited by significant neurologic deficits. She currently requires max assist and family is unable to provide care needed.  SNF was  recommended for progressive therapy and patient was discharge to [facility] on 03/26/16   Rehab course: During patient's stay in rehab weekly team conferences were held to monitor patient's progress, set goals and discuss barriers to discharge. She required total assist with self care tasks and max assist with mobility. She exhibited moderate to severe dysarthria with low voice volume, and moderate cognitive deficits impacting problem solving and recall with basic and familiar tasks. She has had improvement in activity tolerance, balance, postural control, as well as ability to compensate for deficits. She is has had improvement in functional use RUE  and RLE as well as improved awareness. She is able to express basic wants and needs at modified independent level.  She reqires mod to max cues with extra time for recall and problem solving. She is tolerating dysphagia 1, thin liquids with chin tuck. She needs supervision with min cues for safe swallow strategies.  She is able to propel wheelchair with left hemi technique and supervision. She is able to perform SB transfers with mod assist and max cues. Balance at edge of mat is improving. She is able to ambulate 6" + 3" with left rail and max assist to advance RLE.    Disposition: Skilled Nursing Facility  Diet: Dysphagia 1, thin liquids with chin tuck.Marland Kitchen   Special Instructions: 1. Needs full supervision at meals. Use straws  with liquids to facilitate chin tuck. 2. Offer fluids between meals. Recheck lytes in 5-7 days. 3.use PRAFO on RLE and Prevalon boot LLE when in bed for pressure relief. Use resting hand splint RUE at nights.  4. Follow up with neurology in [place] in 4-6 weeks.   Discharge Instructions    Ambulatory referral to Physical Medicine Rehab    Complete by:  As directed    3-4 weeks follow up/going to SNF     Allergies as of 03/26/2016   No Known Allergies     Medication List    STOP taking these medications   aspirin  EC 325 MG tablet Replaced by:  aspirin 81 MG chewable tablet   docusate sodium 100 MG capsule Commonly known as:  COLACE Replaced by:  docusate 50 MG/5ML liquid   FLUZONE IM   omeprazole 20 MG capsule Commonly known as:  PRILOSEC     TAKE these medications   acetaminophen 325 MG tablet Commonly known as:  TYLENOL Take 1-2 tablets (325-650 mg total) by mouth every 4 (four) hours as needed for mild pain.   alum & mag hydroxide-simeth 200-200-20 MG/5ML suspension Commonly known as:  MAALOX/MYLANTA Take 30 mLs by mouth every 4 (four) hours as needed for indigestion.   amLODipine 10 MG tablet Commonly known as:  NORVASC Take 1 tablet (10 mg total) by mouth daily. What changed:  medication strength  how much to take   aspirin 81 MG chewable tablet Chew 1 tablet (81 mg total) by mouth daily. Replaces:  aspirin EC 325 MG tablet   atorvastatin 40 MG tablet Commonly known as:  LIPITOR Take 1 tablet (40 mg total) by mouth daily at 6 PM.   baclofen 10 MG tablet Commonly known as:  LIORESAL Take 0.5 tablets (5 mg total) by mouth 3 (three) times daily.   bisacodyl 10 MG suppository Commonly known as:  DULCOLAX Place 1 suppository (10 mg total) rectally every other day.   docusate 50 MG/5ML liquid Commonly known as:  COLACE Take 10 mLs (100 mg total) by mouth 2 (two) times daily. Replaces:  docusate sodium 100 MG capsule   feeding supplement (ENSURE ENLIVE) Liqd Take 237 mLs by mouth 3 (three) times daily between meals.   FLUoxetine 20 MG capsule Commonly known as:  PROZAC Take 1 capsule (20 mg total) by mouth daily.   hydrALAZINE 25 MG tablet Commonly known as:  APRESOLINE Take 1.5 tablets (37.5 mg total) by mouth every 8 (eight) hours.   levothyroxine 75 MCG tablet Commonly known as:  SYNTHROID, LEVOTHROID Take 75 mcg by mouth daily before breakfast.   lisinopril 20 MG tablet Commonly known as:  PRINIVIL,ZESTRIL Take 1 tablet (20 mg total) by mouth daily. What  changed:  medication strength  how much to take   metoprolol tartrate 25 MG tablet Commonly known as:  LOPRESSOR Take 1 tablet (25 mg total) by mouth 2 (two) times daily. What changed:  how much to take   multivitamin with minerals Tabs tablet Take 1 tablet by mouth daily.   MUSCLE RUB 10-15 % Crea Apply 1 application topically 4 (four) times daily -  with meals and at bedtime. Right calf/right thigh   pantoprazole sodium 40 mg/20 mL Pack Commonly known as:  PROTONIX Place 20 mLs (40 mg total) into feeding tube daily.   simethicone 40 MG/0.6ML drops Commonly known as:  MYLICON Take 0.6 mLs (40 mg total) by mouth every 6 (six) hours as needed for flatulence.   traZODone  50 MG tablet Commonly known as:  DESYREL Take 1 tablet (50 mg total) by mouth at bedtime.   Vitamin D (Cholecalciferol) 400 units Caps Take 400 Units by mouth daily.       Contact information for follow-up providers    Ankit Karis Juba, MD Follow up.   Specialty:  Physical Medicine and Rehabilitation Why:  office will call you with follow up appointment Contact information: [address] [place] [address] [phone]        Dorothey Baseman, MD Follow up.   Specialty:  Family Medicine Why:  call for follow up appointment after discharge and for referral to local neurologist.  Contact information: [address] [place] [address] [phone]            Contact information for after-discharge care    Destination    [facility] Follow up.   Specialty:  Skilled Nursing Facility Contact information: [address] [place] [address] [phone]                  Signed: Jacquelynn Cree 03/26/2016, 9:41 AM

## 2016-03-25 NOTE — Progress Notes (Signed)
Speech Language Pathology Daily Session Note  Patient Details  Name: Christina LungMildred C Bradshaw MRN: [id number] Date of Birth: December 18, 1924  Today's Date: 03/25/2016 SLP Individual Time: 1130-1200 SLP Individual Time Calculation (min): 30 min  Short Term Goals: Week 4: SLP Short Term Goal 1 (Week 4): Pt will consume dysphagia 1 diet with thin liquids with Mod A verbal cues for use of compensatory swallow strategies.  SLP Short Term Goal 2 (Week 4): Pt will consume dysphagia 1 diet with thin liquids without overt s/s of aspiration.  SLP Short Term Goal 3 (Week 4): Patient will utilize speech intelligibility strategies at the phrase level with Min A multimodal cues to achieve >75% intelligibility.  SLP Short Term Goal 4 (Week 4): Patient will utilize word-finding strateiges during functional tasks/conversations with Min A verbal cues.  SLP Short Term Goal 5 (Week 4): Patient will demonstrate functional problem solving for basic and familiar tasks with Min A multimodal cues.  SLP Short Term Goal 6 (Week 4): Patient will recall new, daily information with Min A verbal cues.   Skilled Therapeutic Interventions: Skilled treatment session focused on dysphagia and speech goals. Patient consumed lunch meal of Dys. 1 textures with thin liquids via straw with Min A verbal cues needed for use of swallowing compensatory strategies. Patient demonstrated overt cough X 1, suspect due to decreased coordination of chin tuck. Patient verbalized at the phrase level and was Mod I for basic expression of wants/needs. Patient left upright in wheelchair with all needs within reach. Continue with current plan of care.      Function:  Cognition Comprehension Comprehension assist level: Follows basic conversation/direction with extra time/assistive device  Expression   Expression assist level: Expresses basic needs/ideas: With no assist  Social Interaction Social Interaction assist level: Interacts appropriately with others with  medication or extra time (anti-anxiety, antidepressant).  Problem Solving Problem solving assist level: Solves basic 50 - 74% of the time/requires cueing 25 - 49% of the time  Memory Memory assist level: Recognizes or recalls 50 - 74% of the time/requires cueing 25 - 49% of the time    Pain Pain Assessment Pain Assessment: 0-10 Pain Score: 0-No pain  Therapy/Group: Individual Therapy  Christina Bradshaw 03/25/2016, 12:43 PM

## 2016-03-25 NOTE — Plan of Care (Signed)
Problem: RH Bed Mobility Goal: LTG Patient will perform bed mobility with assist (PT) LTG: Patient will perform bed mobility with assistance, with/without cues (PT).  Outcome: Completed/Met Date Met: 03/25/16 Hospital bed  Problem: RH Bed to Chair Transfers Goal: LTG Patient will perform bed/chair transfers w/assist (PT) LTG: Patient will perform bed/chair transfers with assistance, with/without cues (PT).  Outcome: Not Met (add Reason) 2/2 decreased strength & awareness  Problem: RH Ambulation Goal: LTG Patient will ambulate in controlled environment (PT) LTG: Patient will ambulate in a controlled environment, # of feet with assistance (PT).  Outcome: Completed/Met Date Met: 03/25/16 25 ft with rail in hallway  Problem: RH Wheelchair Mobility Goal: LTG Patient will propel w/c in controlled environment (PT) LTG: Patient will propel wheelchair in controlled environment, # of feet with assist (PT)  Outcome: Completed/Met Date Met: 03/25/16 150 ft  Goal: LTG Patient will propel w/c in home environment (PT) LTG: Patient will propel wheelchair in home environment, # of feet with assistance (PT).  Outcome: Completed/Met Date Met: 03/25/16 50 ft

## 2016-03-25 NOTE — NC FL2 (Signed)
[place] MEDICAID FL2 LEVEL OF CARE SCREENING TOOL     IDENTIFICATION  Patient Name: Christina Bradshaw Birthdate: October 13, 1924 Sex: female Admission Date (Current Location): 02/29/2016  [facility] [place]   Facility and Address:  [facility] [place], [address], [place], [place] [address]      Provider Number: [id number]  Attending Physician Name and Address:  Ankit Karis JubaAnil Patel, MD  Relative Name and Phone Number:       Current Level of Care: Other (Comment) (Inpatient Rehabiliation Program) Recommended Level of Care: Skilled Nursing Facility Prior Approval Number:    Date Approved/Denied:   PASRR Number: [id number] [id number]  Discharge Plan: SNF    Current Diagnoses: Patient Active Problem List   Diagnosis Date Noted  . Slow transit constipation   . Hyponatremia   . Lethargy   . Neuropathic pain   . Acute lower UTI   . Abnormal chest x-ray   . Hypernatremia   . Leukocytosis   . AKI (acute kidney injury) (HCC)   . Labile blood pressure   . Prediabetes   . Hypokalemia   . Hypoalbuminemia due to protein-calorie malnutrition (HCC)   . Stage 3 chronic kidney disease   . Left sided cerebral hemisphere cerebrovascular accident (CVA) (HCC) 02/29/2016  . Spastic hemiparesis of right dominant side due to cerebral infarction (HCC) 02/29/2016  . Dysphagia 02/29/2016  . Essential hypertension 02/29/2016  . TIA (transient ischemic attack) 02/27/2016  . CVA (cerebral vascular accident) (HCC) 02/27/2016    Orientation RESPIRATION BLADDER Height & Weight     Self, Time, Situation, Place  Normal Incontinent Weight: 59.1 kg (130 lb 4.7 oz) Height:  5\' 7"  (170.2 cm)  BEHAVIORAL SYMPTOMS/MOOD NEUROLOGICAL BOWEL NUTRITION STATUS      Incontinent Diet (Dys. 1 with thin liquids)  AMBULATORY STATUS COMMUNICATION OF NEEDS Skin   Extensive Assist Verbally Other (Comment) (deep tissue injury to outer aspect of right heel;  foam dressing to area)                        Personal Care Assistance Level of Assistance  Bathing, Feeding, Dressing Bathing Assistance: Maximum assistance Feeding assistance: Limited assistance Dressing Assistance: Maximum assistance     Functional Limitations Info  Speech     Speech Info: Impaired (very soft spoken and some slurred speech)    SPECIAL CARE FACTORS FREQUENCY  PT (By licensed PT), OT (By licensed OT), Speech therapy     PT Frequency: 5x/wk OT Frequency: 5x/wk     Speech Therapy Frequency: 5x/wk      Contractures Contractures Info: Not present    Additional Factors Info  Code Status Code Status Info: Partial code             Current Medications (03/25/2016):  This is the current hospital active medication list Current Facility-Administered Medications  Medication Dose Route Frequency Provider Last Rate Last Dose  . acetaminophen (TYLENOL) tablet 325-650 mg  325-650 mg Oral Q4H PRN Jacquelynn Creeamela S Love, PA-C   650 mg at 03/24/16 2009  . alum & mag hydroxide-simeth (MAALOX/MYLANTA) 200-200-20 MG/5ML suspension 30 mL  30 mL Oral Q4H PRN Jacquelynn CreePamela S Love, PA-C      . amLODipine (NORVASC) tablet 10 mg  10 mg Oral Daily Evlyn Kanneramela S Love, PA-C   10 mg at 03/25/16 0800  . aspirin chewable tablet 81 mg  81 mg Oral Daily Jacquelynn Creeamela S Love, PA-C   81 mg at 03/25/16  0759  . atorvastatin (LIPITOR) tablet 40 mg  40 mg Oral q1800 Evlyn Kanner Love, PA-C   40 mg at 03/24/16 1731  . baclofen (LIORESAL) tablet 5 mg  5 mg Oral TID Jacquelynn Cree, PA-C   5 mg at 03/25/16 0758  . bisacodyl (DULCOLAX) suppository 10 mg  10 mg Rectal Q48H Evlyn Kanner Love, PA-C   10 mg at 03/23/16 1804  . cholecalciferol (VITAMIN D) tablet 500 Units  500 Units Oral Daily Jacquelynn Cree, PA-C   500 Units at 03/25/16 0801  . diphenhydrAMINE (BENADRYL) 12.5 MG/5ML elixir 12.5-25 mg  12.5-25 mg Oral Q6H PRN Jacquelynn Cree, PA-C      . docusate (COLACE) 50 MG/5ML liquid 100 mg  100 mg Oral BID Erick Colace, MD   100 mg at 03/25/16 0800  .  enoxaparin (LOVENOX) injection 30 mg  30 mg Subcutaneous Q24H Ankit Karis Juba, MD   30 mg at 03/24/16 2013  . feeding supplement (ENSURE ENLIVE) (ENSURE ENLIVE) liquid 237 mL  237 mL Oral TID BM Jacquelynn Cree, PA-C   237 mL at 03/24/16 2012  . feeding supplement (PRO-STAT SUGAR FREE 64) liquid 30 mL  30 mL Oral BID Ankit Karis Juba, MD   30 mL at 03/25/16 0758  . FLUoxetine (PROZAC) capsule 20 mg  20 mg Oral Daily Ankit Karis Juba, MD   20 mg at 03/25/16 0758  . guaiFENesin-dextromethorphan (ROBITUSSIN DM) 100-10 MG/5ML syrup 5-10 mL  5-10 mL Oral Q6H PRN Evlyn Kanner Love, PA-C      . hydrALAZINE (APRESOLINE) tablet 25 mg  25 mg Oral Q8H Ankit Karis Juba, MD   25 mg at 03/25/16 0531  . levothyroxine (SYNTHROID, LEVOTHROID) tablet 75 mcg  75 mcg Oral QAC breakfast Jacquelynn Cree, PA-C   75 mcg at 03/25/16 0531  . lisinopril (PRINIVIL,ZESTRIL) tablet 20 mg  20 mg Oral Daily Jacquelynn Cree, PA-C   20 mg at 03/25/16 0758  . MEDLINE mouth rinse  15 mL Mouth Rinse BID Jacquelynn Cree, PA-C   15 mL at 03/25/16 0801  . methocarbamol (ROBAXIN) tablet 500 mg  500 mg Oral Q8H PRN Ankit Karis Juba, MD   500 mg at 03/22/16 0420  . metoprolol tartrate (LOPRESSOR) tablet 25 mg  25 mg Oral BID Jacquelynn Cree, PA-C   25 mg at 03/25/16 0759  . multivitamin with minerals tablet 1 tablet  1 tablet Oral Daily Ankit Karis Juba, MD   1 tablet at 03/25/16 0758  . MUSCLE RUB CREA   Topical TID WC & HS Jacquelynn Cree, PA-C   1 application at 03/25/16 0801  . pantoprazole sodium (PROTONIX) 40 mg/20 mL oral suspension 40 mg  40 mg Per Tube Daily Allena Katz, RPH   40 mg at 03/25/16 0800  . prochlorperazine (COMPAZINE) tablet 5-10 mg  5-10 mg Oral Q6H PRN Jacquelynn Cree, PA-C       Or  . prochlorperazine (COMPAZINE) injection 5-10 mg  5-10 mg Intramuscular Q6H PRN Jacquelynn Cree, PA-C   5 mg at 03/12/16 1049   Or  . prochlorperazine (COMPAZINE) suppository 12.5 mg  12.5 mg Rectal Q6H PRN Jacquelynn Cree, PA-C      . RESOURCE  THICKENUP CLEAR   Oral PRN Ankit Karis Juba, MD      . senna (SENOKOT) tablet 8.6 mg  1 tablet Oral Daily PRN Jacquelynn Cree, PA-C      . simethicone [facility])  40 MG/0.6ML suspension 40 mg  40 mg Oral Q6H PRN Jacquelynn Cree, PA-C      . sodium phosphate (FLEET) 7-19 GM/118ML enema 1 enema  1 enema Rectal Once PRN Jacquelynn Cree, PA-C      . traZODone (DESYREL) tablet 50 mg  50 mg Oral QHS Pamela S Love, PA-C   50 mg at 03/24/16 2009     Discharge Medications: Please see discharge summary for a list of discharge medications.  Relevant Imaging Results:  Relevant Lab Results:   Additional Information SS# [id number]  Amada Jupiter, LCSW

## 2016-03-25 NOTE — Progress Notes (Signed)
Physical Therapy Session Note  Patient Details  Name: Christina Bradshaw MRN: [id number] Date of Birth: [birth year]  Today's Date: 03/25/2016 PT Individual Time: 0900-1000 PT Individual Time Calculation (min): 60 min   Short Term Goals: Week 4:  PT Short Term Goal 1 (Week 4): = LTGs due to LOS -D/c plan for SNF  Skilled Therapeutic Interventions/Progress Updates: Pt presented in bed, agreeable to therapy. Performed rolling min guard to R, modA to L for changing  Brief. Pt able to lift RLE and do partial bridge to assist donning pants. Required minA for supine to sit with HOB elevated and able to sit unsupported while receiving medication. SB transfer to R to w/c with modA and continued cues for hips/head relationship. Pt performed SB transfer to mat with modA and max cues. Pt participated in unsupported seated while performed AROM/AAROM with RLE for forced use. Performed sit to stand at wall rail for maintaining neutral and increased R wt bearing. Pt returned to room and remained in w/c with call bell within reach and current needs met.      Therapy Documentation Precautions:  Precautions Precautions: Fall Precaution Comments: dense Rt hemiplegia Restrictions Weight Bearing Restrictions: No   See Function Navigator for Current Functional Status.   Therapy/Group: Individual Therapy  Verlon Pischke  Darien Mignogna, PTA  03/25/2016, 12:34 PM

## 2016-03-25 NOTE — Progress Notes (Addendum)
Physical Therapy Discharge Summary  Patient Details  Name: Christina Bradshaw MRN: [id number] Date of Birth: December 12, 1924  Today's Date: 03/25/2016 PT Individual Time: 6644-0347 PT Individual Time Calculation (min): 53 min    Patient has met 5 of 7 long term goals due to improved activity tolerance, improved balance, improved postural control, increased strength, ability to compensate for deficits, improved awareness and improved coordination.  Patient to discharge at a wheelchair level Supervision.   PT recommendation is that pt d/c with 24 hr assistance.  Reasons goals not met: strength deficits, impaired awareness, and impaired balance  Recommendation:  Patient will benefit from ongoing skilled PT services in skilled nursing facility setting to continue to advance safe functional mobility, address ongoing impairments in decreased standing balance, impaired gait & stair negotiation, impaired transfers, decreased awareness, and minimize fall risk.  Equipment: To be decided in next venue of care.  Reasons for discharge: treatment goals met  Patient/family agrees with progress made and goals achieved: Yes  Skilled PT Treatment Patient received in bed & agreeable to tx; pt denied c/o pain. Pt transferred supine>sitting EOB with mod assist to transfer BLE to EOB and to transfer trunk to upright sitting. Therapist donned shocks & shoes total assist for time management. LCSW notified pt that she will d/c to SNF tomorrow therefore session focused on grad day activities. Pt propelled w/c throughout unit (hallways & apartment) with L hemi technique & supervision overall. Pt completed slide board transfers bed>w/c and w/c<>car (even height) with max assist. Pt requires max multimodal cuing for anterior weight shifting, head/hips relationship and sequencing. Pt attempts to push to standing instead of pushing to slide across board. Pt negotiated 12 steps (6" + 3") with L rail and max assist with therapist  advancing and stabilizing RLE. Pt with significant fatigue after task & required extended rest break. At end of session pt left sitting in w/c in room with son present to supervise & all needs within reach.  PT Discharge Precautions/Restrictions Precautions Precautions: Fall Precaution Comments: R hemiplegia Restrictions Weight Bearing Restrictions: No  Motor  Motor Motor: Hemiplegia (RUE/LE) Motor - Skilled Clinical Observations: general weakness   Mobility Bed Mobility Bed Mobility: Supine to Sit Supine to Sit: 3: Mod assist;HOB elevated;With rails Supine to Sit Details: Tactile cues for initiation;Tactile cues for sequencing;Tactile cues for weight shifting;Tactile cues for placement;Tactile cues for posture;Verbal cues for sequencing;Verbal cues for technique;Manual facilitation for weight shifting;Manual facilitation for placement Transfers Transfers: Yes Sit to Stand: 2: Max assist;With armrests Sit to Stand Details: Tactile cues for initiation;Tactile cues for posture;Tactile cues for sequencing;Verbal cues for sequencing;Verbal cues for technique Stand Pivot Transfers: 2: Max assist;With armrests Stand Pivot Transfer Details: Tactile cues for initiation;Tactile cues for weight shifting;Tactile cues for sequencing;Tactile cues for placement;Tactile cues for posture;Verbal cues for sequencing;Verbal cues for technique;Verbal cues for precautions/safety;Verbal cues for safe use of DME/AE;Manual facilitation for weight shifting;Manual facilitation for placement Lateral/Scoot Transfers: 2: Max assist;With slide board (with slide board) Lateral/Scoot Transfer Details: Tactile cues for initiation;Tactile cues for posture;Verbal cues for sequencing;Verbal cues for gait pattern;Manual facilitation for placement;Verbal cues for safe use of DME/AE;Verbal cues for technique;Tactile cues for placement;Tactile cues for sequencing;Tactile cues for weight shifting;Verbal cues for  precautions/safety;Manual facilitation for weight shifting  Locomotion  Ambulation Ambulation: No Gait Gait: No Stairs / Additional Locomotion Stairs: Yes Stairs Assistance: 2: Max assist Stairs Assistance Details: Tactile cues for initiation;Tactile cues for posture;Verbal cues for sequencing;Verbal cues for gait pattern;Manual facilitation for placement;Verbal cues for technique;Tactile  cues for placement;Tactile cues for sequencing;Tactile cues for weight shifting;Visual cues/gestures for sequencing;Verbal cues for precautions/safety;Manual facilitation for weight shifting Stairs Assistance Details (indicate cue type and reason): advancement & placement of RLE Stair Management Technique: One rail Left Number of Stairs: 12 Height of Stairs:  (6" + 3") Wheelchair Mobility Wheelchair Assistance: 5: Supervision (controlled environment) Wheelchair Propulsion: Left lower extremity;Left upper extremity Wheelchair Parts Management: Needs assistance Distance: 150 ft   Balance Balance Balance Assessed: Yes Static Sitting Balance Static Sitting - Balance Support: Bilateral upper extremity supported Static Sitting - Level of Assistance: 5: Stand by assistance  Extremity Assessment  RLE Strength RLE Overall Strength Comments: R hemiplegia LLE Assessment LLE Assessment: Within Functional Limits   See Function Navigator for Current Functional Status.  Christina Bradshaw 03/25/2016, 5:24 PM

## 2016-03-25 NOTE — Plan of Care (Signed)
Problem: RH Balance Goal: LTG Patient will maintain dynamic standing balance (PT) LTG:  Patient will maintain dynamic standing balance with assistance during mobility activities (PT)  Outcome: Not Met (add Reason) Impaired balance & strength

## 2016-03-26 ENCOUNTER — Inpatient Hospital Stay (HOSPITAL_COMMUNITY): Payer: Medicare Other | Admitting: Speech Pathology

## 2016-03-26 LAB — BASIC METABOLIC PANEL
ANION GAP: 10 (ref 5–15)
BUN: 30 mg/dL — AB (ref 6–20)
CHLORIDE: 103 mmol/L (ref 101–111)
CO2: 24 mmol/L (ref 22–32)
Calcium: 8.9 mg/dL (ref 8.9–10.3)
Creatinine, Ser: 0.96 mg/dL (ref 0.44–1.00)
GFR calc Af Amer: 59 mL/min — ABNORMAL LOW (ref >60)
GFR calc non Af Amer: 51 mL/min — ABNORMAL LOW (ref >60)
GLUCOSE: 94 mg/dL (ref 65–99)
POTASSIUM: 4 mmol/L (ref 3.5–5.1)
Sodium: 137 mmol/L (ref 135–145)

## 2016-03-26 MED ORDER — HYDRALAZINE HCL 25 MG PO TABS
37.5000 mg | ORAL_TABLET | Freq: Three times a day (TID) | ORAL | Status: DC
Start: 2016-03-26 — End: 2016-03-26
  Administered 2016-03-26: 37.5 mg via ORAL
  Filled 2016-03-26: qty 2

## 2016-03-26 MED ORDER — HYDRALAZINE HCL 25 MG PO TABS: 37.5000 mg | ORAL_TABLET | Freq: Three times a day (TID) | ORAL | Status: AC

## 2016-03-26 NOTE — Progress Notes (Signed)
Social Work  Discharge Note  The overall goal for the admission was met for:   Discharge location: No - plan changed to SNF as family cannot meet assistance needs at current functional level.  Length of Stay: Yes - 26 days  Discharge activity level: No - most goals downgraded  Home/community participation: No - d/c to SNF  Services provided included: MD, RD, PT, OT, SLP, RN, TR, Pharmacy, Neuropsych and SW  Financial Services: Medicare and Private Insurance: [place]  Follow-up services arranged: Other: SNF @ [facility] in [place] (or additional information):  Patient/Family verbalized understanding of follow-up arrangements: Yes  Individual responsible for coordination of the follow-up plan: pt  Confirmed correct DME delivered: NA    Christina Bradshaw

## 2016-03-26 NOTE — Progress Notes (Signed)
Occupational Therapy Discharge Summary  Patient Details  Name: Christina Bradshaw MRN: [id number] Date of Birth: [birth year]     Patient has met 8 of 12 long term goals due to improved activity tolerance, improved balance, ability to compensate for deficits, improved attention and improved coordination.  Patient to discharge at overall mod - max A level.  Reasons goals not met: Pt continues to need mod - max A for functional transfers.   Recommendation:  Patient will benefit from ongoing skilled OT services in skilled nursing facility setting to continue to advance functional skills in the area of BADL.  Equipment: No equipment provided  Reasons for discharge: discharge to SNF  Patient/family agrees with progress made and goals achieved: Yes  OT Discharge Precautions/Restrictions  Precautions Precautions: Fall Precaution Comments: R hemiplegia Restrictions Weight Bearing Restrictions: No  Pain Pain Assessment Pain Assessment: No/denies pain Vision/Perception  Vision- History Baseline Vision/History: No visual deficits Patient Visual Report: No change from baseline Vision- Assessment Vision Assessment?: No apparent visual deficits  Cognition Overall Cognitive Status: Impaired/Different from baseline Arousal/Alertness: Awake/alert Orientation Level: Oriented X4 Attention: Selective Selective Attention: Impaired Selective Attention Impairment: Functional basic Memory: Impaired Memory Impairment: Decreased recall of new information Awareness: Impaired Awareness Impairment: Emergent impairment Problem Solving: Impaired Problem Solving Impairment: Functional basic Safety/Judgment: Appears intact Sensation   Motor  Motor Motor: Hemiplegia Motor - Skilled Clinical Observations: general weakness Mobility  Bed Mobility Bed Mobility: Supine to Sit Supine to Sit: 3: Mod assist;HOB elevated;With rails  Trunk/Postural Assessment     Balance Balance Balance  Assessed: Yes Static Sitting Balance Static Sitting - Balance Support: Bilateral upper extremity supported Static Sitting - Level of Assistance: 5: Stand by assistance Dynamic Sitting Balance Dynamic Sitting - Balance Support: Left upper extremity supported;Feet supported;During functional activity Dynamic Sitting - Level of Assistance: 5: Stand by assistance Extremity/Trunk Assessment RUE Assessment RUE Assessment: Exceptions to [facility] ( subluxation, increased tone) LUE Assessment LUE Assessment: Within Functional Limits   See Function Navigator for Current Functional Status.  Gypsy Decant 03/26/2016, 10:33 AM

## 2016-03-26 NOTE — Plan of Care (Signed)
Problem: RH Balance Goal: LTG Patient will maintain dynamic standing with ADLs (OT) LTG:  Patient will maintain dynamic standing balance with assist during activities of daily living (OT)   Outcome: Not Met (add Reason) Mod - max A depending on level of fatigue  Problem: RH Bathing Goal: LTG Patient will bathe with assist, cues/equipment (OT) LTG: Patient will bathe specified number of body parts with assist with/without cues using equipment (position)  (OT)  Outcome: Not Met (add Reason) Mod A needed for task   Problem: RH Toilet Transfers Goal: LTG Patient will perform toilet transfers w/assist (OT) LTG: Patient will perform toilet transfers with assist, with/without cues using equipment (OT)  Outcome: Not Met (add Reason) Max A   Problem: RH Tub/Shower Transfers Goal: LTG Patient will perform tub/shower transfers w/assist (OT) LTG: Patient will perform tub/shower transfers with assist, with/without cues using equipment (OT)  Outcome: Not Met (add Reason) Max A

## 2016-03-26 NOTE — Progress Notes (Signed)
[place] PHYSICAL MEDICINE & REHABILITATION     PROGRESS NOTE  Subjective/Complaints:  Pt seen sitting up in bed this AM eating breakfast.  She slept well overnight.  She knows she is scheduled to go to SNF later today.   ROS: Denies nausea, vomiting, diarrhea, shortness of breath or chest pain  Objective: Vital Signs: Blood pressure (!) 156/64, pulse 64, temperature 98.2 F (36.8 C), temperature source Oral, resp. rate 17, height 5\' 7"  (1.702 m), weight 59.1 kg (130 lb 4.7 oz), SpO2 97 %. No results found. No results for input(s): WBC, HGB, HCT, PLT in the last 72 hours.  Recent Labs  03/24/16 0258 03/26/16 0523  NA 136 137  K 4.9 4.0  CL 104 103  GLUCOSE 81 94  BUN 37* 30*  CREATININE 1.06* 0.96  CALCIUM 8.7* 8.9   CBG (last 3)  No results for input(s): GLUCAP in the last 72 hours.  Wt Readings from Last 3 Encounters:  03/12/16 59.1 kg (130 lb 4.7 oz)  02/27/16 66.5 kg (146 lb 9.7 oz)  10/25/14 67.6 kg (149 lb)    Physical Exam:  BP (!) 156/64 (BP Location: Left Arm)   Pulse 64   Temp 98.2 F (36.8 C) (Oral)   Resp 17   Ht 5\' 7"  (1.702 m)   Wt 59.1 kg (130 lb 4.7 oz)   SpO2 97%   BMI 20.41 kg/m  Constitutional: She appears well-developed and well-nourished.  HENT: Normocephalic and atraumatic.  Eyes: EOMI. No discharge.  Cardiovascular: RRR without JVD Respiratory: CTA B/L. Unlabored.  GI: Soft. Bowel sounds are normal.  Musculoskeletal: She exhibits no edema. No tenderness.  Neurological: She is alert.  Right facial weakness with severe dysarthria Able to follow basic commands Motor: RUE 0/5 prox to distal (stable) RLE: 2-/5 HF, 2-/5 KE, 1+/5 ADF/DP.  mAS: right 1/4 elbow flexors, wrist extensors, knee flexors, 3 at R pectoralis, 3/4 ADF (stable) Skin: Skin is warm and dry. Intact. Psychiatric: Flat affect. Normal behavior.   Assessment/Plan: 1. Functional deficits secondary to left corona radiata,putamen,caudate infarct which require 3+ hours per  day of interdisciplinary therapy in a comprehensive inpatient rehab setting. Physiatrist is providing close team supervision and 24 hour management of active medical problems listed below. Physiatrist and rehab team continue to assess barriers to discharge/monitor patient progress toward functional and medical goals.  Function:  Bathing Bathing position Bathing activity did not occur: Refused Position: Sitting EOB  Bathing parts Body parts bathed by patient: Right arm, Chest, Abdomen, Front perineal area, Right upper leg, Right lower leg, Left upper leg Body parts bathed by helper: Left arm, Buttocks, Left lower leg, Back  Bathing assist Assist Level:  (mod A)      Upper Body Dressing/Undressing Upper body dressing   What is the patient wearing?: Pull over shirt/dress     Pull over shirt/dress - Perfomed by patient: Put head through opening, Pull shirt over trunk, Thread/unthread right sleeve Pull over shirt/dress - Perfomed by helper: Thread/unthread left sleeve Button up shirt - Perfomed by patient: Thread/unthread right sleeve, Thread/unthread left sleeve Button up shirt - Perfomed by helper: Pull shirt around back, Button/unbutton shirt    Upper body assist Assist Level: Touching or steadying assistance(Pt > 75%)      Lower Body Dressing/Undressing Lower body dressing Lower body dressing/undressing activity did not occur: N/A What is the patient wearing?: Pants     Pants- Performed by patient: Thread/unthread right pants leg, Thread/unthread left pants leg Pants- Performed by  helper: Pull pants up/down   Non-skid slipper socks- Performed by helper: Don/doff right sock, Don/doff left sock       Shoes - Performed by helper: Don/doff right shoe, Don/doff left shoe, Fasten right, Fasten left          Lower body assist Assist for lower body dressing:  (mod A)      Toileting Toileting Toileting activity did not occur: No continent bowel/bladder event Toileting steps  completed by patient: Adjust clothing after toileting, Adjust clothing prior to toileting Toileting steps completed by helper: Adjust clothing prior to toileting, Performs perineal hygiene, Adjust clothing after toileting Toileting Assistive Devices: Grab bar or rail  Toileting assist Assist level: Touching or steadying assistance (Pt.75%)   Transfers Chair/bed transfer Chair/bed transfer activity did not occur: N/A Chair/bed transfer method: Lateral scoot Chair/bed transfer assist level: Maximal assist (Pt 25 - 49%/lift and lower) Chair/bed transfer assistive device: Sliding board Mechanical lift: Stedy   Locomotion Ambulation Ambulation activity did not occur: Safety/medical concerns   Max distance: 25 Assist level: Maximal assist (Pt 25 - 49%)   Wheelchair   Type: Manual Max wheelchair distance: 150 ft (controlled environment) Assist Level: Supervision or verbal cues  Cognition Comprehension Comprehension assist level: Follows basic conversation/direction with extra time/assistive device  Expression Expression assist level: Expresses basic needs/ideas: With no assist  Social Interaction Social Interaction assist level: Interacts appropriately with others with medication or extra time (anti-anxiety, antidepressant).  Problem Solving Problem solving assist level: Solves basic 50 - 74% of the time/requires cueing 25 - 49% of the time  Memory Memory assist level: Recognizes or recalls 50 - 74% of the time/requires cueing 25 - 49% of the time    Medical Problem List and Plan: 1. Right hemiparesis and dysarthria/dysphagia secondary to left corona radiata,putamen,caudate infarct   D/c SNF  Will see patient in 1 month for follow up 2. DVT Prophylaxis/Anticoagulation: Pharmaceutical: Lovenox  3. Pain Management: tylenol prn   Robaxin 500 TID PRN  Neurontin started 1/23  4. Mood:   Has history of anxiety--appears controlled at present  Started prozac to help with mood and recovery.    LCSW to follow for evaluation and support.  5. Neuropsych: This patient is not fully capable of making decisions on her own behalf.  6. Skin/Wound Care: Routine pressure relief measures. Maintain adequate nutritional and hydration status.  7. Fluids/Electrolytes/Nutrition: Monitor I/O. Supplements between meals.  8. HTN:  Norvasc increased to 10mg  on 1/15  Hydralazine  increased to 25 on 1/18, increased 37.5 on 2/7   Cont lisinopril and metoprolol bid.  9. Dysphagia: Dysphagia 1, thin. Offer fluids between meals to maintain adequate hydration.  10. Dyslipidemia: on lipitor  11. Prediabetes: Hgb A1c- 6.0.   Modified diet to HH/CM. RD to educate patient and family on appropriate diet.  12. GERD: Managed On Protonix.  13. Chronic constipation: Uses softners as well as suppository daily.   Cont bowel reg.  14. Right spastic hemiparesis:   Baclofen 5 TID.   Resting hand splint and R-AFO, discussed with nursing bracing at night.  ROM with therapy.   continue stretching,positioning    May need Botox as outpt 15. Hypokalemia: improved  K+ 4.0 on 2/7  Mg+ 2.1 on 1/19  IVF with K+ qHS, d/ced on 1/31 16. Hypoalbuminemia  Supplement initiated 1/13 17. CKD III   IVF qHS started 1/16,  changed to NS, d/ced on 1/31.   BUN/Creat stable off IVF  Cont enc po fluid to maintain  18.  Constipation   Increased bowel reg  Improved  LOS (Days) 26 A FACE TO FACE EVALUATION WAS PERFORMED  Jeff Mccallum Karis Juba 03/26/2016 8:14 AM

## 2016-03-26 NOTE — Progress Notes (Signed)
Speech Language Pathology Discharge Summary  Patient Details  Name: Christina Bradshaw MRN: [id number] Date of Birth: 1924-04-06  Today's Date: 03/26/2016 SLP Individual Time: 0930-1000 SLP Individual Time Calculation (min): 30 min   Skilled Therapeutic Interventions:  Skilled treatment focused on dysphagia and speech communication goals. SLP facilitated session by providing Min A verbal cues for recall of compensatory swallow strategies and Mod A verbal cues for recall and use of speech intelligibility strategies to achieve ~90 % intelligibility at the sentence level. Pt left in bed with all needs within reach.    Patient has met 5 of 5 long term goals.  Patient to discharge at overall Mod level.  Reasons goals not met:     Clinical Impression/Discharge Summary: Pt has made functional progress and as a result has met 5 of 5 LTG's. Pt continues to require Mod A verbal cues for overall speech intelligibility and problem solving. Pt currently tolerates a dysphagia 1 diet with thin liquids via straw to facilitate a chin tuck and multiple swallows. Trials of dysphagia have been too difficult d/t fatigue and decreased bolus management. Recommend pt receive follow up ST at next venue of care.   Care Partner:  Caregiver Able to Provide Assistance: No  Type of Caregiver Assistance: Cognitive  Recommendation:  Skilled Nursing facility  Rationale for SLP Follow Up: Maximize functional communication;Maximize swallowing safety   Equipment: None   Reasons for discharge: Treatment goals met   Patient/Family Agrees with Progress Made and Goals Achieved: Yes   Function:  Eating Eating   Modified Consistency Diet: Yes Eating Assist Level: Supervision or verbal cues   Eating Set Up Assist For: Opening containers       Cognition Comprehension Comprehension assist level: Understands complex 90% of the time/cues 10% of the time  Expression   Expression assist level: Expresses basic 50 - 74%  of the time/requires cueing 25 - 49% of the time. Needs to repeat parts of sentences.  Social Interaction Social Interaction assist level: Interacts appropriately 90% of the time - Needs monitoring or encouragement for participation or interaction.  Problem Solving Problem solving assist level: Solves basic 50 - 74% of the time/requires cueing 25 - 49% of the time  Memory Memory assist level: Recognizes or recalls 50 - 74% of the time/requires cueing 25 - 49% of the time    Aydenn Gervin B. Rutherford Nail, M.S., [place] 03/26/2016, 10:59 AM

## 2016-03-26 NOTE — Progress Notes (Signed)
Social Work Patient ID: [name], female   DOB: 1924/04/21, 83 y.o.   MRN: [id number]   Received SNF bed offer yesterday from [facility] and pt/ family have accepted with planned transfer today after lunch via ambulance.  Tx team aware.  Danylah Holden, LCSW

## 2016-03-26 NOTE — Progress Notes (Signed)
Patient was discharged to nursing home via ambulance, accompanied by her son.

## 2016-04-30 ENCOUNTER — Inpatient Hospital Stay: Payer: Medicare Other | Admitting: Physical Medicine & Rehabilitation

## 2017-01-29 ENCOUNTER — Encounter: Payer: Self-pay | Admitting: Emergency Medicine

## 2017-01-29 ENCOUNTER — Inpatient Hospital Stay
Admission: EM | Admit: 2017-01-29 | Discharge: 2017-02-05 | DRG: 193 | Disposition: A | Discharge status: Home Health | Payer: Medicare Other | Attending: Internal Medicine | Admitting: Internal Medicine

## 2017-01-29 ENCOUNTER — Emergency Department: Payer: Medicare Other

## 2017-01-29 DIAGNOSIS — Z66 Do not resuscitate: Secondary | ICD-10-CM | POA: Diagnosis present

## 2017-01-29 DIAGNOSIS — N183 Chronic kidney disease, stage 3 unspecified: Secondary | ICD-10-CM | POA: Diagnosis present

## 2017-01-29 DIAGNOSIS — J189 Pneumonia, unspecified organism: Principal | ICD-10-CM | POA: Diagnosis present

## 2017-01-29 DIAGNOSIS — J9601 Acute respiratory failure with hypoxia: Secondary | ICD-10-CM | POA: Diagnosis present

## 2017-01-29 DIAGNOSIS — N390 Urinary tract infection, site not specified: Secondary | ICD-10-CM | POA: Diagnosis present

## 2017-01-29 DIAGNOSIS — F329 Major depressive disorder, single episode, unspecified: Secondary | ICD-10-CM | POA: Diagnosis present

## 2017-01-29 DIAGNOSIS — J181 Lobar pneumonia, unspecified organism: Secondary | ICD-10-CM

## 2017-01-29 DIAGNOSIS — Z7982 Long term (current) use of aspirin: Secondary | ICD-10-CM

## 2017-01-29 DIAGNOSIS — Z79899 Other long term (current) drug therapy: Secondary | ICD-10-CM

## 2017-01-29 DIAGNOSIS — I10 Essential (primary) hypertension: Secondary | ICD-10-CM | POA: Diagnosis present

## 2017-01-29 DIAGNOSIS — R0902 Hypoxemia: Secondary | ICD-10-CM

## 2017-01-29 DIAGNOSIS — I129 Hypertensive chronic kidney disease with stage 1 through stage 4 chronic kidney disease, or unspecified chronic kidney disease: Secondary | ICD-10-CM | POA: Diagnosis present

## 2017-01-29 DIAGNOSIS — I69951 Hemiplegia and hemiparesis following unspecified cerebrovascular disease affecting right dominant side: Secondary | ICD-10-CM

## 2017-01-29 DIAGNOSIS — E039 Hypothyroidism, unspecified: Secondary | ICD-10-CM | POA: Diagnosis present

## 2017-01-29 DIAGNOSIS — R0602 Shortness of breath: Secondary | ICD-10-CM

## 2017-01-29 LAB — CBC WITH DIFFERENTIAL/PLATELET
BASOS PCT: 1 %
Basophils Absolute: 0.1 10*3/uL (ref 0–0.1)
EOS PCT: 0 %
Eosinophils Absolute: 0 10*3/uL (ref 0–0.7)
HEMATOCRIT: 42.7 % (ref 35.0–47.0)
HEMOGLOBIN: 14.1 g/dL (ref 12.0–16.0)
Lymphocytes Relative: 8 %
Lymphs Abs: 0.9 10*3/uL — ABNORMAL LOW (ref 1.0–3.6)
MCH: 31 pg (ref 26.0–34.0)
MCHC: 32.9 g/dL (ref 32.0–36.0)
MCV: 94.1 fL (ref 80.0–100.0)
MONO ABS: 0.9 10*3/uL (ref 0.2–0.9)
Monocytes Relative: 8 %
NEUTROS ABS: 9.1 10*3/uL — AB (ref 1.4–6.5)
Neutrophils Relative %: 83 %
Platelets: 224 10*3/uL (ref 150–440)
RBC: 4.54 MIL/uL (ref 3.80–5.20)
RDW: 15.2 % — ABNORMAL HIGH (ref 11.5–14.5)
WBC: 11 10*3/uL (ref 3.6–11.0)

## 2017-01-29 LAB — COMPREHENSIVE METABOLIC PANEL
ALBUMIN: 3.3 g/dL — AB (ref 3.5–5.0)
ALT: 17 U/L (ref 14–54)
ANION GAP: 8 (ref 5–15)
AST: 23 U/L (ref 15–41)
Alkaline Phosphatase: 79 U/L (ref 38–126)
BILIRUBIN TOTAL: 0.7 mg/dL (ref 0.3–1.2)
BUN: 20 mg/dL (ref 6–20)
CALCIUM: 8.8 mg/dL — AB (ref 8.9–10.3)
CO2: 23 mmol/L (ref 22–32)
Chloride: 106 mmol/L (ref 101–111)
Creatinine, Ser: 1.01 mg/dL — ABNORMAL HIGH (ref 0.44–1.00)
GFR calc Af Amer: 55 mL/min — ABNORMAL LOW (ref >60)
GFR calc non Af Amer: 48 mL/min — ABNORMAL LOW (ref >60)
GLUCOSE: 134 mg/dL — AB (ref 65–99)
Potassium: 4 mmol/L (ref 3.5–5.1)
Sodium: 137 mmol/L (ref 135–145)
TOTAL PROTEIN: 7.1 g/dL (ref 6.5–8.1)

## 2017-01-29 LAB — URINALYSIS, ROUTINE W REFLEX MICROSCOPIC
BILIRUBIN URINE: NEGATIVE
Glucose, UA: NEGATIVE mg/dL
Hgb urine dipstick: NEGATIVE
KETONES UR: NEGATIVE mg/dL
Nitrite: NEGATIVE
PH: 5 (ref 5.0–8.0)
PROTEIN: 30 mg/dL — AB
Specific Gravity, Urine: 1.019 (ref 1.005–1.030)

## 2017-01-29 LAB — LACTIC ACID, PLASMA: Lactic Acid, Venous: 1.4 mmol/L (ref 0.5–1.9)

## 2017-01-29 LAB — INFLUENZA PANEL BY PCR (TYPE A & B)
INFLAPCR: NEGATIVE
INFLBPCR: NEGATIVE

## 2017-01-29 MED ORDER — DEXTROSE 5 % IV SOLN
500.0000 mg | Freq: Once | INTRAVENOUS | Status: AC
  Administered 2017-01-30: 500 mg via INTRAVENOUS
  Filled 2017-01-29: qty 500

## 2017-01-29 MED ORDER — ACETAMINOPHEN 500 MG PO TABS
1000.0000 mg | ORAL_TABLET | Freq: Once | ORAL | Status: AC
  Administered 2017-01-29: 1000 mg via ORAL

## 2017-01-29 MED ORDER — ACETAMINOPHEN 500 MG PO TABS
ORAL_TABLET | ORAL | Status: AC
  Filled 2017-01-29: qty 2

## 2017-01-29 MED ORDER — CEFTRIAXONE SODIUM IN DEXTROSE 20 MG/ML IV SOLN
1.0000 g | Freq: Once | INTRAVENOUS | Status: AC
  Administered 2017-01-29: 1 g via INTRAVENOUS
  Filled 2017-01-29: qty 50

## 2017-01-29 NOTE — ED Triage Notes (Signed)
Pt arrived via ems from home with complaints of shortness of breath. Pt was given 1 duo neb in route 7 4mg  of zofran. Upon arrival pt alert and oriented x4. Pt denies pain but remains short of breath with conversation. Pt had a stroke in January and has right sided deficits.

## 2017-01-29 NOTE — ED Notes (Signed)
Pt's oxygen dropped to 98%, pt placed on 2L via nasal canula.

## 2017-01-29 NOTE — ED Provider Notes (Signed)
[facility] Emergency Department Provider Note ____________________________________________   First MD Initiated Contact with Patient 01/29/17 2041     (approximate)  I have reviewed the triage vital signs and the nursing notes.   HISTORY  Chief Complaint Shortness of Breath    HPI Christina Bradshaw is a 83 y.o. female with past medical history as noted below who presents with shortness of breath, gradual onset over the last several days, preceded by approximately 1 week of URI symptoms, and associated with nonproductive cough and chills.  Patient denies chest pain.  No sick contacts, other recent illness, or recent last few months.  Past Medical History:  Diagnosis Date  . Hypertension   . Stroke [facility](HCC)     Patient Active Problem List   Diagnosis Date Noted  . Slow transit constipation   . Hyponatremia   . Lethargy   . Neuropathic pain   . Acute lower UTI   . Abnormal chest x-ray   . Hypernatremia   . Leukocytosis   . AKI (acute kidney injury) (HCC)   . Labile blood pressure   . Prediabetes   . Hypokalemia   . Hypoalbuminemia due to protein-calorie malnutrition (HCC)   . Stage 3 chronic kidney disease (HCC)   . Left sided cerebral hemisphere cerebrovascular accident (CVA) (HCC) 02/29/2016  . Spastic hemiparesis of right dominant side due to cerebral infarction 02/29/2016  . Dysphagia 02/29/2016  . Essential hypertension 02/29/2016  . TIA (transient ischemic attack) 02/27/2016  . CVA (cerebral vascular accident) (HCC) 02/27/2016    Past Surgical History:  Procedure Laterality Date  . ABDOMINAL HYSTERECTOMY      Prior to Admission medications   Medication Sig Start Date End Date Taking? Authorizing Provider  acetaminophen (TYLENOL) 325 MG tablet Take 1-2 tablets (325-650 mg total) by mouth every 4 (four) hours as needed for mild pain. 03/25/16   Jacquelynn CreeLove, Pamela S, PA-C  alum & mag hydroxide-simeth (MAALOX/MYLANTA) 200-200-20 MG/5ML  suspension Take 30 mLs by mouth every 4 (four) hours as needed for indigestion. 03/25/16   Love, Evlyn KannerPamela S, PA-C  amLODipine (NORVASC) 10 MG tablet Take 1 tablet (10 mg total) by mouth daily. 03/26/16   Love, Evlyn KannerPamela S, PA-C  aspirin 81 MG chewable tablet Chew 1 tablet (81 mg total) by mouth daily. 03/26/16   Love, Evlyn KannerPamela S, PA-C  atorvastatin (LIPITOR) 40 MG tablet Take 1 tablet (40 mg total) by mouth daily at 6 PM. 02/29/16   Gouru, Deanna ArtisAruna, MD  baclofen (LIORESAL) 10 MG tablet Take 0.5 tablets (5 mg total) by mouth 3 (three) times daily. 03/25/16   Love, Evlyn KannerPamela S, PA-C  bisacodyl (DULCOLAX) 10 MG suppository Place 1 suppository (10 mg total) rectally every other day. 03/25/16   Love, Evlyn KannerPamela S, PA-C  docusate (COLACE) 50 MG/5ML liquid Take 10 mLs (100 mg total) by mouth 2 (two) times daily. 03/25/16   Love, Evlyn KannerPamela S, PA-C  feeding supplement, ENSURE ENLIVE, (ENSURE ENLIVE) LIQD Take 237 mLs by mouth 3 (three) times daily between meals. 03/25/16   Love, Evlyn KannerPamela S, PA-C  FLUoxetine (PROZAC) 20 MG capsule Take 1 capsule (20 mg total) by mouth daily. 03/26/16   Love, Evlyn KannerPamela S, PA-C  hydrALAZINE (APRESOLINE) 25 MG tablet Take 1.5 tablets (37.5 mg total) by mouth every 8 (eight) hours. 03/26/16   Love, Evlyn KannerPamela S, PA-C  levothyroxine (SYNTHROID, LEVOTHROID) 75 MCG tablet Take 75 mcg by mouth daily before breakfast.    [provider]  lisinopril (PRINIVIL,ZESTRIL) 20 MG tablet  Take 1 tablet (20 mg total) by mouth daily. 03/26/16   Love, Evlyn Kanner, PA-C  Menthol-Methyl Salicylate (MUSCLE RUB) 10-15 % CREA Apply 1 application topically 4 (four) times daily -  with meals and at bedtime. Right calf/right thigh 03/25/16   Love, Evlyn Kanner, PA-C  metoprolol tartrate (LOPRESSOR) 25 MG tablet Take 1 tablet (25 mg total) by mouth 2 (two) times daily. 03/25/16   Jacquelynn Cree, PA-C  Multiple Vitamin (MULTIVITAMIN WITH MINERALS) TABS tablet Take 1 tablet by mouth daily.    [provider]  pantoprazole sodium (PROTONIX) 40 mg/20 mL  PACK Place 20 mLs (40 mg total) into feeding tube daily. 03/26/16   Love, Evlyn Kanner, PA-C  simethicone (MYLICON) 40 MG/0.6ML drops Take 0.6 mLs (40 mg total) by mouth every 6 (six) hours as needed for flatulence. 02/29/16   Ramonita Lab, MD  traZODone (DESYREL) 50 MG tablet Take 1 tablet (50 mg total) by mouth at bedtime. 03/25/16   Jacquelynn Cree, PA-C  Vitamin D, Cholecalciferol, 400 UNITS CAPS Take 400 Units by mouth daily.    [provider]    Allergies Patient has no known allergies.  Family History  Problem Relation Age of Onset  . Breast cancer Mother     Social History Social History   Tobacco Use  . Smoking status: Never Smoker  . Smokeless tobacco: Never Used  Substance Use Topics  . Alcohol use: No  . Drug use: No    Review of Systems  Constitutional: Positive for chills. Eyes: No redness. ENT: Positive for nasal congestion. Cardiovascular: Denies chest pain. Respiratory: Positive for shortness of breath. Gastrointestinal: No nausea, no vomiting.   Genitourinary: Negative for dysuria.  Musculoskeletal: Negative for back pain. Skin: Negative for rash. Neurological: Negative for headache.    ____________________________________________   PHYSICAL EXAM:  VITAL SIGNS: ED Triage Vitals  Enc Vitals Group     BP 01/29/17 2036 (!) 163/96     Pulse Rate 01/29/17 2036 91     Resp 01/29/17 2036 (!) 21     Temp 01/29/17 2036 97.9 F (36.6 C)     Temp Source 01/29/17 2036 Oral     SpO2 01/29/17 2036 92 %     Weight 01/29/17 2037 140 lb (63.5 kg)     Height 01/29/17 2037 5\' 7"  (1.702 m)     Head Circumference --      Peak Flow --      Pain Score --      Pain Loc --      Pain Edu? --      Excl. in GC? --     Constitutional: Alert and oriented.  Relatively well-appearing.. Eyes: Conjunctivae are normal.  Head: Atraumatic. Nose: No rhinnorhea. Mouth/Throat: Mucous membranes are moist.   Neck: Normal range of motion.  Cardiovascular: Normal rate,  regular rhythm. Grossly normal heart sounds.  Good peripheral circulation. Respiratory: Normal respiratory effort.  Bilateral rhonchi and coarse breath sounds. Gastrointestinal: Soft and nontender. No distention.  Genitourinary: No CVA tenderness. Musculoskeletal: No lower extremity edema.  Extremities warm and well perfused.  Neurologic:  Normal speech and language.  Right upper extremity chronic weakness, but no acute focal neurologic deficits are appreciated.  Skin:  Skin is warm and dry. No rash noted. Psychiatric: Mood and affect are normal. Speech and behavior are normal.  ____________________________________________   LABS (all labs ordered are listed, but only abnormal results are displayed)  Labs Reviewed  COMPREHENSIVE METABOLIC PANEL - Abnormal; Notable  for the following components:      Result Value   Glucose, Bld 134 (*)    Creatinine, Ser 1.01 (*)    Calcium 8.8 (*)    Albumin 3.3 (*)    GFR calc non Af Amer 48 (*)    GFR calc Af Amer 55 (*)    All other components within normal limits  CBC WITH DIFFERENTIAL/PLATELET - Abnormal; Notable for the following components:   RDW 15.2 (*)    Neutro Abs 9.1 (*)    Lymphs Abs 0.9 (*)    All other components within normal limits  URINALYSIS, ROUTINE W REFLEX MICROSCOPIC - Abnormal; Notable for the following components:   Color, Urine YELLOW (*)    APPearance CLOUDY (*)    Protein, ur 30 (*)    Leukocytes, UA LARGE (*)    Bacteria, UA MANY (*)    Squamous Epithelial / LPF 0-5 (*)    All other components within normal limits  CULTURE, BLOOD (ROUTINE X 2)  CULTURE, BLOOD (ROUTINE X 2)  LACTIC ACID, PLASMA  INFLUENZA PANEL BY PCR (TYPE A & B)  LACTIC ACID, PLASMA   ____________________________________________  EKG  ED ECG REPORT I, Dionne BucySebastian Pahoua Schreiner, the attending physician, personally viewed and interpreted this ECG.  Date: 01/29/2017 EKG Time: 2051 Rate: 73 Rhythm: normal sinus rhythm with PACs QRS Axis:  normal Intervals: Left anterior fascicular block ST/T Wave abnormalities: normal Narrative Interpretation: no evidence of acute ischemia; no significant change when compared to EKG of 02/29/2016  ____________________________________________  RADIOLOGY  CXR: L lower lobe infiltrate concerning for pneumonia  ____________________________________________   PROCEDURES  Procedure(s) performed: No    Critical Care performed: No ____________________________________________   INITIAL IMPRESSION / ASSESSMENT AND PLAN / ED COURSE  Pertinent labs & imaging results that were available during my care of the patient were reviewed by me and considered in my medical decision making (see chart for details).  83 year old female with past medical history as noted above presents with shortness of breath for the last several days, after having congestion, cough and URI symptoms for the last week.  Patient was found to be borderline hypoxic by EMS.  On exam, patient is relatively well-appearing and in no acute distress.  Her vital signs are normal except for O2 sat in the low 90s on 2 L nasal cannula.  She has bilateral rhonchi and coarse breath sounds, but no respiratory distress and no retractions.  Review of past medical records in Epic is noncontributory; patient was last admitted to the hospital in January of this year for stroke.    Differential for this presentation includes primarily pneumonia, acute bronchitis, flu or other viral syndrome, or less likely cardiac cause.  Plan for chest x-ray, labs and infectious workup, nebs, and reassess.  Anticipate if patient has pneumonia she will likely need admission to the hospital given her borderline hypoxia.      ----------------------------------------- 11:25 PM on 01/29/2017 -----------------------------------------  Chest x-ray shows likely pneumonia, which is consistent with patient's clinical presentation.  Given that she has not been in the  hospital in the last 90 days I will give antibiotics for community acquired pneumonia.  Given her borderline hypoxia, age, and functional status, she is most appropriate for admission.  ____________________________________________   FINAL CLINICAL IMPRESSION(S) / ED DIAGNOSES  Final diagnoses:  Community acquired pneumonia of left lower lobe of lung (HCC)  Hypoxia      NEW MEDICATIONS STARTED DURING THIS VISIT:  This SmartLink is deprecated.  Use AVSMEDLIST instead to display the medication list for a patient.   Note:  This document was prepared using Dragon voice recognition software and may include unintentional dictation errors.    Dionne Bucy, MD 01/29/17 2326

## 2017-01-30 ENCOUNTER — Other Ambulatory Visit: Payer: Self-pay

## 2017-01-30 DIAGNOSIS — J189 Pneumonia, unspecified organism: Secondary | ICD-10-CM | POA: Diagnosis present

## 2017-01-30 DIAGNOSIS — N390 Urinary tract infection, site not specified: Secondary | ICD-10-CM | POA: Diagnosis present

## 2017-01-30 DIAGNOSIS — I129 Hypertensive chronic kidney disease with stage 1 through stage 4 chronic kidney disease, or unspecified chronic kidney disease: Secondary | ICD-10-CM | POA: Diagnosis present

## 2017-01-30 DIAGNOSIS — J9601 Acute respiratory failure with hypoxia: Secondary | ICD-10-CM | POA: Diagnosis present

## 2017-01-30 DIAGNOSIS — E039 Hypothyroidism, unspecified: Secondary | ICD-10-CM | POA: Diagnosis present

## 2017-01-30 DIAGNOSIS — R0902 Hypoxemia: Secondary | ICD-10-CM | POA: Diagnosis present

## 2017-01-30 DIAGNOSIS — Z79899 Other long term (current) drug therapy: Secondary | ICD-10-CM | POA: Diagnosis not present

## 2017-01-30 DIAGNOSIS — Z7982 Long term (current) use of aspirin: Secondary | ICD-10-CM | POA: Diagnosis not present

## 2017-01-30 DIAGNOSIS — N183 Chronic kidney disease, stage 3 (moderate): Secondary | ICD-10-CM | POA: Diagnosis present

## 2017-01-30 DIAGNOSIS — I69951 Hemiplegia and hemiparesis following unspecified cerebrovascular disease affecting right dominant side: Secondary | ICD-10-CM | POA: Diagnosis not present

## 2017-01-30 DIAGNOSIS — Z66 Do not resuscitate: Secondary | ICD-10-CM | POA: Diagnosis present

## 2017-01-30 DIAGNOSIS — F329 Major depressive disorder, single episode, unspecified: Secondary | ICD-10-CM | POA: Diagnosis present

## 2017-01-30 LAB — BLOOD CULTURE ID PANEL (REFLEXED)

## 2017-01-30 LAB — CBC
HCT: 37 % (ref 35.0–47.0)
Hemoglobin: 12.5 g/dL (ref 12.0–16.0)
MCH: 31.8 pg (ref 26.0–34.0)
MCHC: 33.7 g/dL (ref 32.0–36.0)
MCV: 94.4 fL (ref 80.0–100.0)
PLATELETS: 207 10*3/uL (ref 150–440)
RBC: 3.92 MIL/uL (ref 3.80–5.20)
RDW: 14.8 % — AB (ref 11.5–14.5)
WBC: 10.1 10*3/uL (ref 3.6–11.0)

## 2017-01-30 LAB — BASIC METABOLIC PANEL
Anion gap: 9 (ref 5–15)
BUN: 19 mg/dL (ref 6–20)
CHLORIDE: 105 mmol/L (ref 101–111)
CO2: 23 mmol/L (ref 22–32)
CREATININE: 0.85 mg/dL (ref 0.44–1.00)
Calcium: 8.7 mg/dL — ABNORMAL LOW (ref 8.9–10.3)
GFR, EST NON AFRICAN AMERICAN: 59 mL/min — AB (ref >60)
Glucose, Bld: 134 mg/dL — ABNORMAL HIGH (ref 65–99)
POTASSIUM: 3.7 mmol/L (ref 3.5–5.1)
SODIUM: 137 mmol/L (ref 135–145)

## 2017-01-30 MED ORDER — ONDANSETRON HCL 4 MG/2ML IJ SOLN
4.0000 mg | Freq: Four times a day (QID) | INTRAMUSCULAR | Status: DC | PRN
  Filled 2017-01-30: qty 2

## 2017-01-30 MED ORDER — LISINOPRIL 20 MG PO TABS
20.0000 mg | ORAL_TABLET | Freq: Every day | ORAL | Status: DC
  Administered 2017-01-30 – 2017-02-04 (×6): 20 mg via ORAL
  Filled 2017-01-30 (×6): qty 1

## 2017-01-30 MED ORDER — ORAL CARE MOUTH RINSE
15.0000 mL | Freq: Two times a day (BID) | OROMUCOSAL | Status: DC
  Administered 2017-01-30 – 2017-02-05 (×12): 15 mL via OROMUCOSAL

## 2017-01-30 MED ORDER — HYDRALAZINE HCL 25 MG PO TABS
37.5000 mg | ORAL_TABLET | Freq: Three times a day (TID) | ORAL | Status: DC
  Administered 2017-01-30 – 2017-02-05 (×19): 37.5 mg via ORAL
  Filled 2017-01-30 (×21): qty 1.5

## 2017-01-30 MED ORDER — ACETAMINOPHEN 325 MG PO TABS
650.0000 mg | ORAL_TABLET | Freq: Four times a day (QID) | ORAL | Status: DC | PRN
  Administered 2017-01-30 – 2017-02-04 (×6): 650 mg via ORAL
  Filled 2017-01-30 (×6): qty 2

## 2017-01-30 MED ORDER — GUAIFENESIN-DM 100-10 MG/5ML PO SYRP
5.0000 mL | ORAL_SOLUTION | ORAL | Status: DC | PRN
  Administered 2017-01-30 – 2017-02-02 (×4): 5 mL via ORAL
  Filled 2017-01-30 (×5): qty 5

## 2017-01-30 MED ORDER — LEVOTHYROXINE SODIUM 25 MCG PO TABS
75.0000 ug | ORAL_TABLET | Freq: Every day | ORAL | Status: DC
  Administered 2017-01-30 – 2017-02-05 (×7): 75 ug via ORAL
  Filled 2017-01-30 (×7): qty 1

## 2017-01-30 MED ORDER — ASPIRIN 81 MG PO CHEW
81.0000 mg | CHEWABLE_TABLET | Freq: Every day | ORAL | Status: DC
  Administered 2017-01-30 – 2017-02-05 (×7): 81 mg via ORAL
  Filled 2017-01-30 (×7): qty 1

## 2017-01-30 MED ORDER — AMLODIPINE BESYLATE 10 MG PO TABS
10.0000 mg | ORAL_TABLET | Freq: Every day | ORAL | Status: DC
  Administered 2017-01-30 – 2017-02-04 (×6): 10 mg via ORAL
  Filled 2017-01-30 (×6): qty 1

## 2017-01-30 MED ORDER — ACETAMINOPHEN 650 MG RE SUPP: 650.0000 mg | Freq: Four times a day (QID) | RECTAL | Status: DC | PRN

## 2017-01-30 MED ORDER — ONDANSETRON HCL 4 MG PO TABS: 4.0000 mg | ORAL_TABLET | Freq: Four times a day (QID) | ORAL | Status: DC | PRN

## 2017-01-30 MED ORDER — DEXTROSE 5 % IV SOLN
500.0000 mg | INTRAVENOUS | Status: DC
  Administered 2017-01-30 – 2017-02-02 (×4): 500 mg via INTRAVENOUS
  Filled 2017-01-30 (×5): qty 500

## 2017-01-30 MED ORDER — PANTOPRAZOLE SODIUM 40 MG PO PACK
40.0000 mg | PACK | Freq: Every day | ORAL | Status: DC
Start: 2017-01-30 — End: 2017-02-05
  Administered 2017-01-30 – 2017-02-05 (×7): 40 mg via ORAL
  Filled 2017-01-30 (×7): qty 20

## 2017-01-30 MED ORDER — FLUOXETINE HCL 20 MG PO CAPS
20.0000 mg | ORAL_CAPSULE | Freq: Every day | ORAL | Status: DC
  Administered 2017-01-30 – 2017-02-02 (×4): 20 mg via ORAL
  Filled 2017-01-30 (×4): qty 1

## 2017-01-30 MED ORDER — DEXTROSE 5 % IV SOLN
2.0000 g | INTRAVENOUS | Status: DC
  Administered 2017-01-30 – 2017-02-03 (×5): 2 g via INTRAVENOUS
  Filled 2017-01-30 (×6): qty 2

## 2017-01-30 MED ORDER — BUDESONIDE 0.5 MG/2ML IN SUSP
0.5000 mg | Freq: Two times a day (BID) | RESPIRATORY_TRACT | Status: DC
  Administered 2017-01-30 – 2017-02-05 (×13): 0.5 mg via RESPIRATORY_TRACT
  Filled 2017-01-30 (×13): qty 2

## 2017-01-30 MED ORDER — ENOXAPARIN SODIUM 40 MG/0.4ML ~~LOC~~ SOLN
40.0000 mg | SUBCUTANEOUS | Status: DC
  Administered 2017-01-30 – 2017-02-04 (×6): 40 mg via SUBCUTANEOUS
  Filled 2017-01-30 (×6): qty 0.4

## 2017-01-30 MED ORDER — ATORVASTATIN CALCIUM 20 MG PO TABS
40.0000 mg | ORAL_TABLET | Freq: Every day | ORAL | Status: DC
  Administered 2017-01-30 – 2017-02-04 (×6): 40 mg via ORAL
  Filled 2017-01-30 (×6): qty 2

## 2017-01-30 MED ORDER — IPRATROPIUM-ALBUTEROL 0.5-2.5 (3) MG/3ML IN SOLN
3.0000 mL | Freq: Four times a day (QID) | RESPIRATORY_TRACT | Status: DC
  Administered 2017-01-30 – 2017-02-01 (×9): 3 mL via RESPIRATORY_TRACT
  Filled 2017-01-30 (×9): qty 3

## 2017-01-30 MED ORDER — IPRATROPIUM-ALBUTEROL 0.5-2.5 (3) MG/3ML IN SOLN: 3.0000 mL | RESPIRATORY_TRACT | Status: DC | PRN

## 2017-01-30 MED ORDER — METHYLPREDNISOLONE SODIUM SUCC 40 MG IJ SOLR
40.0000 mg | Freq: Every day | INTRAMUSCULAR | Status: DC
  Administered 2017-01-30 – 2017-02-03 (×5): 40 mg via INTRAVENOUS
  Filled 2017-01-30 (×5): qty 1

## 2017-01-30 MED ORDER — METOPROLOL TARTRATE 25 MG PO TABS
25.0000 mg | ORAL_TABLET | Freq: Two times a day (BID) | ORAL | Status: DC
  Administered 2017-01-30 – 2017-02-05 (×13): 25 mg via ORAL
  Filled 2017-01-30 (×13): qty 1

## 2017-01-30 MED ORDER — DEXTROSE 5 % IV SOLN: 1.0000 g | INTRAVENOUS | Status: DC

## 2017-01-30 NOTE — Evaluation (Addendum)
Clinical/Bedside Swallow Evaluation Patient Details  Name: Christina Bradshaw MRN: [id number] Date of Birth: [birth year]  Today's Date: 01/30/2017 Time: SLP Start Time (ACUTE ONLY): 1440 SLP Stop Time (ACUTE ONLY): 1540 SLP Time Calculation (min) (ACUTE ONLY): 60 min  Past Medical History:  Past Medical History:  Diagnosis Date  . Hypertension   . Stroke [facility](HCC)    Past Surgical History:  Past Surgical History:  Procedure Laterality Date  . ABDOMINAL HYSTERECTOMY     HPI:  Pt is a 83 y.o. female w/ h/o stroke (resulting R sided weakness) and HTN who presents with significant increase in shortness of breath at home for the past couple days with increasing cough.  She was brought in by family for evaluation and found to have pneumonia as well as UTI here.  She did not meet sepsis criteria.  Family reported "some" coughing at home when drinking thin liquids but no consistently. Pt does have baseline of min oral weakness in bolus control d/t previous h/o stroke and R sided weakness.   Assessment / Plan / Recommendation Clinical Impression  Pt appears to present w/ fairly adequate oropharyngeal phase swallowing function but family has reported inconsistent coughing when drinking thin liquids at home. Pt does have a few risk factors to increase her risk including Stroke at first of the year w/ residual R oral/lingual weakness. When pt consumed thin liquids via cup using small, single sips and taking her time b/t sips to calm her breathing, pt demonstrated no overt coughing/throat clearing or other overt s/s of aspiration; rest breaks b/t trials to lessen and SOB d/t exertion of task. Pt was able to consume puree trials and softened soft foods w/ adequate oral phase managment and clearing given time - pt is not wearing her dentures and the Bottom denture plate is ill-fitting and loose when she attempts to wear this. Pt was able to feed self given setup support and cues. Education given to Son present  re: aspiration precautions; food consistency and preparation; and need to monitor for behavior and use of dentures(possibly not wearing the ill-fitting plate d/t increasing choking issues). Recommended a dysphagia level 3 w/ MINCED meats w/ gravy; thin liquids VIA CUP only; Pills Whole in Puree. NSG updated. Pt does appear at her baseline per Son's report but ST services will be available for any further monitoring or education if needed while admitted.  SLP Visit Diagnosis: Dysphagia, oropharyngeal phase (R13.12)    Aspiration Risk  Mild aspiration risk(but reduced when following aspiration precautions)    Diet Recommendation  Dysphagia level 3 w/ Minced meats and Gravy added to moisten; Thin liquids. Aspiration precautions; support w/ meals.  Medication Administration: Whole meds with puree - baseline at home   Other  Recommendations Recommended Consults: (Dietician f/u) Oral Care Recommendations: Oral care BID;Patient independent with oral care;Staff/trained caregiver to provide oral care   Follow up Recommendations None      Frequency and Duration (n/a)  (n/a)       Prognosis Prognosis for Safe Diet Advancement: Fair(-Good) Barriers to Reach Goals: Time post onset      Swallow Study   General Date of Onset: 01/29/17 HPI: Pt is a 83 y.o. female w/ h/o stroke (resulting R sided weakness) and HTN who presents with significant increase in shortness of breath at home for the past couple days with increasing cough.  She was brought in by family for evaluation and found to have pneumonia as well as UTI here.  She did not meet  sepsis criteria.  Family reported "some" coughing at home when drinking thin liquids but no consistently.  Type of Study: Bedside Swallow Evaluation Previous Swallow Assessment: none reported Diet Prior to this Study: Dysphagia 3 (soft);Thin liquids(softer foods) Temperature Spikes Noted: Yes(110.3;  wbc 10.1) Respiratory Status: Nasal cannula(2-3 liters) History  of Recent Intubation: No Behavior/Cognition: Alert;Cooperative;Pleasant mood;Requires cueing(min) Oral Cavity Assessment: Within Functional Limits Oral Care Completed by SLP: Recent completion by staff Oral Cavity - Dentition: Dentures, top;Dentures, bottom(but not wearing them; Bottom dentures are ill-fitting) Vision: Functional for self-feeding Self-Feeding Abilities: Able to feed self;Needs assist;Needs set up Patient Positioning: Upright in bed Baseline Vocal Quality: Normal;Low vocal intensity Volitional Cough: Congested Volitional Swallow: Able to elicit    Oral/Motor/Sensory Function Overall Oral Motor/Sensory Function: Mild impairment Facial ROM: Reduced right Facial Symmetry: Abnormal symmetry right Facial Strength: Reduced right Lingual ROM: Reduced right Lingual Symmetry: Abnormal symmetry right Lingual Strength: Reduced Velum: Within Functional Limits Mandible: Within Functional Limits   Ice Chips Ice chips: Within functional limits Presentation: Spoon(2 trials)   Thin Liquid Thin Liquid: Within functional limits Presentation: Cup;Self Fed(5 trials) Other Comments: pt has exhibited inconsistent, overt coughing when drinking thin liquids at home per family report. She has also exhibited this when using straws.     Nectar Thick Nectar Thick Liquid: Not tested   Honey Thick Honey Thick Liquid: Not tested   Puree Puree: Within functional limits Presentation: Self Fed;Spoon(4 trials)   Solid   GO   Solid: Impaired Presentation: Self Fed;Spoon(3 trials) Oral Phase Impairments: Impaired mastication(edentulous) Oral Phase Functional Implications: Impaired mastication(needed time) Pharyngeal Phase Impairments: (none noted)    Functional Assessment Tool Used: clinical judgement Functional Limitations: Swallowing Swallow Current Status ([id number]): At least 20 percent but less than 40 percent impaired, limited or restricted Swallow Goal Status [phone]([id number]): At least 20 percent  but less than 40 percent impaired, limited or restricted Swallow Discharge Status [phone]([id number]): At least 20 percent but less than 40 percent impaired, limited or restricted    Jerilynn SomKatherine Watson, MS, CCC-SLP Watson,Katherine 01/30/2017,4:54 PM

## 2017-01-30 NOTE — Care Management Note (Addendum)
Case Management Note  Patient Details  Name: Christina LungMildred C Farina MRN: [id number] Date of Birth: [birth year]  Subjective/Objective:                  Ad[facility] under observation status with the diagnosis of pneumonia.  Lives with son, [name].  Another son is [name] [phone]). Last seen Dr. Terance HartBronstein in August. Prescriptions are filled at [facility] in [place]. No home oxygen. Home Health per [facility].  [employer]  In the past. Hospital bed in the home. No falls, Decreased appetite. Unsure of weight loss. Takes care of all basic activities of daily living herself. Family helps with errands  Action/Plan: Will continue to follow for discharge plans   Expected Discharge Date:                  Expected Discharge Plan:     In-House Referral:     Discharge planning Services     Post Acute Care Choice:    Choice offered to:     DME Arranged:    DME Agency:     HH Arranged:    HH Agency:     Status of Service:     If discussed at [employer] Meetings, dates discussed:    Additional Comments:  Gwenette GreetBrenda S Honest Safranek, RN MSN CCM Care Management [phone] 01/30/2017, 10:45 AM

## 2017-01-30 NOTE — H&P (Signed)
[facility] [place] [facility]   PATIENT NAME: Christina Bradshaw    MR#:  [id number]  DATE OF BIRTH:  11-07-1924  DATE OF ADMISSION:  01/29/2017  PRIMARY CARE PHYSICIAN: Dorothey BasemanBronstein, Amali Uhls, MD   REQUESTING/REFERRING PHYSICIAN: Marisa SeverinSiadecki, MD  CHIEF COMPLAINT:   Chief Complaint  Patient presents with  . Shortness of Breath    HISTORY OF PRESENT ILLNESS:  Christina Bradshaw  is a 83 y.o. female who presents with significant increase in shortness of breath at home for the past couple days with increasing cough.  She was brought in by family for evaluation and found to have pneumonia as well as UTI here.  She did not meet sepsis criteria.  Hospitalist were called for admission  PAST MEDICAL HISTORY:   Past Medical History:  Diagnosis Date  . Hypertension   . Stroke [facility](HCC)     PAST SURGICAL HISTORY:   Past Surgical History:  Procedure Laterality Date  . ABDOMINAL HYSTERECTOMY      SOCIAL HISTORY:   Social History   Tobacco Use  . Smoking status: Never Smoker  . Smokeless tobacco: Never Used  Substance Use Topics  . Alcohol use: No    FAMILY HISTORY:   Family History  Problem Relation Age of Onset  . Breast cancer Mother     DRUG ALLERGIES:  No Known Allergies  MEDICATIONS AT HOME:   Prior to Admission medications   Medication Sig Start Date End Date Taking? Authorizing Provider  acetaminophen (TYLENOL) 325 MG tablet Take 1-2 tablets (325-650 mg total) by mouth every 4 (four) hours as needed for mild pain. 03/25/16   Jacquelynn CreeLove, Pamela S, PA-C  alum & mag hydroxide-simeth (MAALOX/MYLANTA) 200-200-20 MG/5ML suspension Take 30 mLs by mouth every 4 (four) hours as needed for indigestion. 03/25/16   Love, Evlyn KannerPamela S, PA-C  amLODipine (NORVASC) 10 MG tablet Take 1 tablet (10 mg total) by mouth daily. 03/26/16   Love, Evlyn KannerPamela S, PA-C  aspirin 81 MG chewable tablet Chew 1 tablet (81 mg total) by mouth daily. 03/26/16   Love, Evlyn KannerPamela S, PA-C  atorvastatin  (LIPITOR) 40 MG tablet Take 1 tablet (40 mg total) by mouth daily at 6 PM. 02/29/16   Gouru, Deanna ArtisAruna, MD  baclofen (LIORESAL) 10 MG tablet Take 0.5 tablets (5 mg total) by mouth 3 (three) times daily. 03/25/16   Love, Evlyn KannerPamela S, PA-C  bisacodyl (DULCOLAX) 10 MG suppository Place 1 suppository (10 mg total) rectally every other day. 03/25/16   Love, Evlyn KannerPamela S, PA-C  docusate (COLACE) 50 MG/5ML liquid Take 10 mLs (100 mg total) by mouth 2 (two) times daily. 03/25/16   Love, Evlyn KannerPamela S, PA-C  feeding supplement, ENSURE ENLIVE, (ENSURE ENLIVE) LIQD Take 237 mLs by mouth 3 (three) times daily between meals. 03/25/16   Love, Evlyn KannerPamela S, PA-C  FLUoxetine (PROZAC) 20 MG capsule Take 1 capsule (20 mg total) by mouth daily. 03/26/16   Love, Evlyn KannerPamela S, PA-C  hydrALAZINE (APRESOLINE) 25 MG tablet Take 1.5 tablets (37.5 mg total) by mouth every 8 (eight) hours. 03/26/16   Love, Evlyn KannerPamela S, PA-C  levothyroxine (SYNTHROID, LEVOTHROID) 75 MCG tablet Take 75 mcg by mouth daily before breakfast.    [provider]  lisinopril (PRINIVIL,ZESTRIL) 20 MG tablet Take 1 tablet (20 mg total) by mouth daily. 03/26/16   Love, Evlyn KannerPamela S, PA-C  Menthol-Methyl Salicylate (MUSCLE RUB) 10-15 % CREA Apply 1 application topically 4 (four) times daily -  with meals and at bedtime. Right calf/right thigh 03/25/16  Love, Evlyn Kanner, PA-C  metoprolol tartrate (LOPRESSOR) 25 MG tablet Take 1 tablet (25 mg total) by mouth 2 (two) times daily. 03/25/16   Jacquelynn Cree, PA-C  Multiple Vitamin (MULTIVITAMIN WITH MINERALS) TABS tablet Take 1 tablet by mouth daily.    [provider]  pantoprazole sodium (PROTONIX) 40 mg/20 mL PACK Place 20 mLs (40 mg total) into feeding tube daily. 03/26/16   Love, Evlyn Kanner, PA-C  simethicone (MYLICON) 40 MG/0.6ML drops Take 0.6 mLs (40 mg total) by mouth every 6 (six) hours as needed for flatulence. 02/29/16   Ramonita Lab, MD  traZODone (DESYREL) 50 MG tablet Take 1 tablet (50 mg total) by mouth at bedtime. 03/25/16   Jacquelynn Cree, PA-C  Vitamin D, Cholecalciferol, 400 UNITS CAPS Take 400 Units by mouth daily.    [provider]    REVIEW OF SYSTEMS:  Review of Systems  Unable to perform ROS: Medical condition     VITAL SIGNS:   Vitals:   01/29/17 2122 01/29/17 2245 01/29/17 2308 01/30/17 0004  BP: 140/77  (!) 160/81 (!) 170/81  Pulse:  90 92 95  Resp:  (!) 23 (!) 22 (!) 28  Temp:      TempSrc:      SpO2:  94% 95% 99%  Weight:      Height:       Wt Readings from Last 3 Encounters:  01/29/17 63.5 kg (140 lb)  03/12/16 59.1 kg (130 lb 4.7 oz)  02/27/16 66.5 kg (146 lb 9.7 oz)    PHYSICAL EXAMINATION:  Physical Exam  Vitals reviewed. Constitutional: She appears well-developed and well-nourished. No distress.  HENT:  Head: Normocephalic and atraumatic.  Mouth/Throat: Oropharynx is clear and moist.  Eyes: Conjunctivae and EOM are normal. Pupils are equal, round, and reactive to light. No scleral icterus.  Neck: Normal range of motion. Neck supple. No JVD present. No thyromegaly present.  Cardiovascular: Normal rate, regular rhythm and intact distal pulses. Exam reveals no gallop and no friction rub.  No murmur heard. Respiratory: Effort normal. No respiratory distress. She has no wheezes. She has no rales.  ronchi  GI: Soft. Bowel sounds are normal. She exhibits no distension. There is no tenderness.  Musculoskeletal: Normal range of motion. She exhibits no edema.  No arthritis, no gout  Lymphadenopathy:    She has no cervical adenopathy.  Neurological:  Unable to fully assess due to patient's deficits from prior stroke  Skin: Skin is warm and dry. No rash noted. No erythema.  Psychiatric:  Unable to fully assess due to patient condition    LABORATORY PANEL:   CBC Recent Labs  Lab 01/29/17 2038  WBC 11.0  HGB 14.1  HCT 42.7  PLT 224   ------------------------------------------------------------------------------------------------------------------  Chemistries   Recent Labs  Lab 01/29/17 2038  NA 137  K 4.0  CL 106  CO2 23  GLUCOSE 134*  BUN 20  CREATININE 1.01*  CALCIUM 8.8*  AST 23  ALT 17  ALKPHOS 79  BILITOT 0.7   ------------------------------------------------------------------------------------------------------------------  Cardiac Enzymes No results for input(s): TROPONINI in the last 168 hours. ------------------------------------------------------------------------------------------------------------------  RADIOLOGY:  Dg Chest Port 1 View  Result Date: 01/29/2017 CLINICAL DATA:  Dyspnea today. EXAM: PORTABLE CHEST 1 VIEW COMPARISON:  03/10/2016 FINDINGS: Left base consolidation, new. Possible small left effusion. Right lung is clear. Pulmonary vasculature is normal. Unchanged hilar, mediastinal and cardiac contours. IMPRESSION: New left base consolidation and possible small effusion. Suspicious for pneumonia. Followup PA and  lateral chest X-ray is recommended in 3-4 weeks following trial of antibiotic therapy to ensure resolution and exclude underlying malignancy. Electronically Signed   By: Ellery Plunkaniel R Mitchell M.D.   On: 01/29/2017 21:07    EKG:   Orders placed or performed during the hospital encounter of 01/29/17  . ED EKG 12-Lead  . ED EKG 12-Lead  . EKG 12-Lead  . EKG 12-Lead    IMPRESSION AND PLAN:  Principal Problem:   CAP (community acquired pneumonia) -IV antibiotics, PRN duo nebs and antitussive Active Problems:   UTI (urinary tract infection) -IV antibiotics, culture sent   Essential hypertension -continue home meds   Stage 3 chronic kidney disease (HCC) -stable, avoid nephrotoxins and monitor  All the records are reviewed and case discussed with ED provider. Management plans discussed with the patient and/or family.  DVT PROPHYLAXIS: SubQ lovenox  GI PROPHYLAXIS: PPI  ADMISSION STATUS: Observation  CODE STATUS: Partial, see below Code Status History    Date Active Date Inactive Code Status  Order ID Comments User Context   02/29/2016 15:55 03/26/2016 17:35 Partial Code [id number]  Jerene PitchLove, Pamela S, PA-C Inpatient   02/29/2016 15:37 02/29/2016 15:55 DNR [id number]  Jacquelynn CreeLove, Pamela S, PA-C Inpatient   02/29/2016 15:37 02/29/2016 15:37 Full Code [id number]  Jacquelynn CreeLove, Pamela S, PA-C Inpatient   02/27/2016 15:33 02/29/2016 15:34 DNR [id number]  Delfino LovettShah, Vipul, MD Inpatient   02/27/2016 03:23 02/27/2016 15:33 Full Code [id number]  Tonye RoyaltyHugelmeyer, Alexis, DO ED   02/27/2016 01:33 02/27/2016 03:23 DNR [id number]  Tonye RoyaltyHugelmeyer, Alexis, DO ED    Questions for Most Recent Historical Code Status (Order [id number])    Question Answer Comment   In the event of cardiac or respiratory ARREST: Initiate Code Blue, Call Rapid Response Yes    In the event of cardiac or respiratory ARREST: Perform CPR No    In the event of cardiac or respiratory ARREST: Perform Intubation/Mechanical Ventilation No    In the event of cardiac or respiratory ARREST: Use NIPPV/BiPAp only if indicated No    In the event of cardiac or respiratory ARREST: Administer ACLS medications if indicated Yes    In the event of cardiac or respiratory ARREST: Perform Defibrillation or Cardioversion if indicated No       TOTAL TIME TAKING CARE OF THIS PATIENT: 40 minutes.   Anne HahnWILLIS, Montague Corella FIELDING 01/30/2017, 12:23 AM  [facility]  Office  [phone]  CC: Primary care physician; Dorothey BasemanBronstein, Adric Wrede, MD  Note:  This document was prepared using Dragon voice recognition software and may include unintentional dictation errors.

## 2017-01-30 NOTE — Progress Notes (Signed)
Patient ID: Christina Bradshaw, female   DOB: [birth year], 83 y.o.   MRN: [id number]  ACP note.  Patient, sons at the bedside  Diagnosis: Stroke with right arm paralysis and right leg weakness.  Pneumonia with wheezing.  Acute respiratory failure with hypoxia  CODE STATUS discussed.  Patient and family would like to change status to DO NOT RESUSCITATE.  I added Solu-Medrol.  I started budesonide and DuoNeb nebulizer standing dose.  Patient is now on oxygen and she was not prior to coming into the hospital.  Patient will need better air entry before getting out of the hospital.  Swallow evaluation ordered.  Physical exam: Vital signs temperature 100.3, blood pressure 140/59 pulse 85 and respirations 16.  Lungs diffuse wheeze throughout entire lung field.  Cardiovascular system S1-S2 normal no gallops rubs or murmurs heard.  Lower extremities- chronic lower extremity discoloration.  Abdomen is soft nontender.  Neurological.  Right arm paralysis, right leg weakness, facial droop.  Throat no erythema.  Eyes pupils equal round reactive light.  Musculoskeletal no edema.  No cervical lymphadenopathy.   Patient is mostly wheelchair-bound.  Goal is to go home with care with family.  Dr. Alford Highlandichard Meet [place] Time spent on ACP discussion 30 minutes

## 2017-01-30 NOTE — Care Management Obs Status (Signed)
MEDICARE OBSERVATION STATUS NOTIFICATION   Patient Details  Name: Christina Bradshaw MRN: [id number] Date of Birth: 1924-05-15   Medicare Observation Status Notification Given:  Yes    Gwenette GreetBrenda S Promyse Ardito, RN 01/30/2017, 10:44 AM

## 2017-01-30 NOTE — Progress Notes (Signed)
PT Cancellation Note  Patient Details Name: Denita LungMildred C Macquarrie MRN: [id number] DOB: October 22, 1924   Cancelled Treatment:    Reason Eval/Treat Not Completed: Patient not medically ready. Consult received and chart reviewed. Upon arrival, RN in room. Per RN and family, pt very SOB at rest at this time, requesting to let patient rest and work on breathing prior to therapy evaluation. Will re-attempt in PM session.   Armond Cuthrell 01/30/2017, 9:43 AM  Elizabeth PalauStephanie Fredy Gladu, PT, DPT [phone]

## 2017-01-30 NOTE — Progress Notes (Signed)
Pharmacy Antibiotic Note  Christina Bradshaw is a 83 y.o. female admitted on 01/29/2017 with UTI and pneumonia  Pharmacy has been consulted for ceftriaxone dosing.  Plan: Ceftriaxone 2 grams q 24 hours ordered  Height: 5\' 7"  (170.2 cm) Weight: 140 lb (63.5 kg) IBW/kg (Calculated) : 61.6  Temp (24hrs), Avg:99.2 F (37.3 C), Min:97.9 F (36.6 C), Max:100.2 F (37.9 C)  Recent Labs  Lab 01/29/17 2035 01/29/17 2038  WBC  --  11.0  CREATININE  --  1.01*  LATICACIDVEN 1.4  --     Estimated Creatinine Clearance: 36 mL/min (A) (by C-G formula based on SCr of 1.01 mg/dL (H)).    No Known Allergies  Antimicrobials this admission: Azithromycin, ceftriaxone 12/14  >>    >>   Dose adjustments this admission:   Microbiology results: 12/13 BCx: pending 12/14 UCx: pending       12/13 UA: LE(+) NO2(-)  WBC TNTC 12/13 CXR: L base consolidation Thank you for allowing pharmacy to be a part of this patient's care.  Blain Hunsucker S 01/30/2017 2:08 AM

## 2017-01-30 NOTE — Progress Notes (Signed)
PT Cancellation Note  Patient Details Name: Denita LungMildred C Tapscott MRN: [id number] DOB: 1924/12/15   Cancelled Treatment:    Reason Eval/Treat Not Completed: Other (comment). Evaluation re-attempted, however pt just pulled IV out during coughing fit. Although breathing improved, pt reports she feels unable to participate in therapy at this time. RN notified. Will re-attempt next date.   Opaline Reyburn 01/30/2017, 2:23 PM  Elizabeth PalauStephanie Rolf Fells, PT, DPT [phone]

## 2017-01-31 LAB — MAGNESIUM: Magnesium: 1.7 mg/dL (ref 1.7–2.4)

## 2017-01-31 NOTE — Plan of Care (Signed)
  Education: Knowledge of General Education information will improve 01/31/2017 0453 - Progressing by Geri SeminoleJackson, Reatha Sur A, RN   Health Behavior/Discharge Planning: Ability to manage health-related needs will improve 01/31/2017 0453 - Progressing by Geri SeminoleJackson, Rafiq Bucklin A, RN   Clinical Measurements: Ability to maintain clinical measurements within normal limits will improve 01/31/2017 0453 - Progressing by Geri SeminoleJackson, Dayja Loveridge A, RN Diagnostic test results will improve 01/31/2017 0453 - Progressing by Geri SeminoleJackson, Kammi Hechler A, RN Cardiovascular complication will be avoided 01/31/2017 0453 - Progressing by Geri SeminoleJackson, Toniesha Zellner A, RN   Activity: Risk for activity intolerance will decrease 01/31/2017 0453 - Progressing by Geri SeminoleJackson, Macoy Rodwell A, RN   Nutrition: Adequate nutrition will be maintained 01/31/2017 0453 - Progressing by Geri SeminoleJackson, Maurica Omura A, RN   Coping: Level of anxiety will decrease 01/31/2017 0453 - Progressing by Geri SeminoleJackson, Stryder Poitra A, RN   Elimination: Will not experience complications related to bowel motility 01/31/2017 0453 - Progressing by Geri SeminoleJackson, Bethany Cumming A, RN Will not experience complications related to urinary retention 01/31/2017 0453 - Progressing by Geri SeminoleJackson, Calum Cormier A, RN   Pain Managment: General experience of comfort will improve 01/31/2017 0453 - Progressing by Geri SeminoleJackson, Krystle Oberman A, RN   Safety: Ability to remain free from injury will improve 01/31/2017 0453 - Progressing by Geri SeminoleJackson, Yuri Fana A, RN   Skin Integrity: Risk for impaired skin integrity will decrease 01/31/2017 0453 - Progressing by Geri SeminoleJackson, Jayci Ellefson A, RN   Respiratory: Ability to maintain adequate ventilation will improve 01/31/2017 0453 - Progressing by Geri SeminoleJackson, Quantarius Genrich A, RN Ability to maintain a clear airway will improve 01/31/2017 0453 - Progressing by Geri SeminoleJackson, Melissa Tomaselli A, RN   Urinary Elimination: Signs and symptoms of infection will decrease 01/31/2017 0453 - Progressing by Geri SeminoleJackson,  Eilleen Davoli A, RN

## 2017-01-31 NOTE — Progress Notes (Signed)
PHARMACY - PHYSICIAN COMMUNICATION CRITICAL VALUE ALERT - BLOOD CULTURE IDENTIFICATION (BCID)  Christina Bradshaw is an 83 y.o. female who [facility]/2017 with a chief complaint of pneumonia with UTI.   Assessment:  BCID culture called in showing 1 of 4 bottles (anaerobic) with gram negative rods from gram stain but no bacteria identified on BCID. Lab stated that they would be sending the sample to the [facility] lab to try and determine what the organism is. Spoke with Dr. Juliene PinaMody who agreed with plan to continue current antibiotics. Will also order another blood culture to be drawn.   Name of physician (or Provider) Contacted: Dr. Juliene PinaMody  Current antibiotics: Ceftriaxone 2g Q24H and Azithromycin 500mg  Q24H  Changes to prescribed antibiotics recommended:  Patient is on recommended antibiotics - No changes needed  Results for orders placed or performed during the hospital encounter of 01/29/17  Blood Culture ID Panel (Reflexed) (Collected: 01/29/2017  8:38 PM)  Result Value Ref Range   Enterococcus species NOT DETECTED NOT DETECTED   Listeria monocytogenes NOT DETECTED NOT DETECTED   Staphylococcus species NOT DETECTED NOT DETECTED   Staphylococcus aureus NOT DETECTED NOT DETECTED   Streptococcus species NOT DETECTED NOT DETECTED   Streptococcus agalactiae NOT DETECTED NOT DETECTED   Streptococcus pneumoniae NOT DETECTED NOT DETECTED   Streptococcus pyogenes NOT DETECTED NOT DETECTED   Acinetobacter baumannii NOT DETECTED NOT DETECTED   Enterobacteriaceae species NOT DETECTED NOT DETECTED   Enterobacter cloacae complex NOT DETECTED NOT DETECTED   Escherichia coli NOT DETECTED NOT DETECTED   Klebsiella oxytoca NOT DETECTED NOT DETECTED   Klebsiella pneumoniae NOT DETECTED NOT DETECTED   Proteus species NOT DETECTED NOT DETECTED   Serratia marcescens NOT DETECTED NOT DETECTED   Haemophilus influenzae NOT DETECTED NOT DETECTED   Neisseria meningitidis NOT DETECTED NOT  DETECTED   Pseudomonas aeruginosa NOT DETECTED NOT DETECTED   Candida albicans NOT DETECTED NOT DETECTED   Candida glabrata NOT DETECTED NOT DETECTED   Candida krusei NOT DETECTED NOT DETECTED   Candida parapsilosis NOT DETECTED NOT DETECTED   Candida tropicalis NOT DETECTED NOT DETECTED    Yolanda BonineHannah Siana Bradshaw, PharmD Pharmacy Resident 01/31/2017  9:35 AM

## 2017-01-31 NOTE — Progress Notes (Signed)
Physical Therapy Evaluation Patient Details Name: Christina Bradshaw MRN: [id number] DOB: November 15, 1924 Today's Date: 01/31/2017   History of Present Illness  Christina Bradshaw  is a 83 y.o. female who presents with significant increase in shortness of breath at home for the past couple days with increasing cough.  She was brought in by family for evaluation and found to have pneumonia as well as UTI here.  She did not meet sepsis criteria.  Hospitalist were called for admissio     Clinical Impression  Pt admitted with above diagnosis. Pt currently with functional limitations due to the deficits listed below (see PT Problem List).  Pt requires modA+1 for bed mobility with cues for sequencing. HOB elevated and use of bed rails. This is patient's baseline. Pt requires increased time for sit to stand transfers with modA+1. Cues for safe hand placement. Pt is unsteady in standing and requires modA+1 for stand pivot transfer to recliner. Unable to ambulate at this time. SaO2 drops to 80% on room air with transfers and only recovers to 85%. She requires supplemental O2 to recover to 93%. No home O2. At home she only performs transfers and utilizes a wheelchair as her primary means for mobility. Per patient and family she is currently at her baseline with respect to mobility. She had HH PT one month ago however it didn't help and she was discharged due to no progress. [facility] Will keep patient on caseload to prevent decline during admission.   SaO2 on room air at rest = 93% SaO2 on room air while transferring = 80%, doesn't recover with seated rest break above 85% on room air. SaO2 on 2 liters of O2 after transfers = 93%      Follow Up Recommendations No PT follow up;Other (comment)(HH PT one month ago, didn't helped, DC due to no progress)    Equipment Recommendations  None recommended by PT    Recommendations for Other Services       Precautions / Restrictions  Precautions Precautions: Fall Restrictions Weight Bearing Restrictions: No      Mobility  Bed Mobility Overal bed mobility: Needs Assistance Bed Mobility: Supine to Sit     Supine to sit: Mod assist     General bed mobility comments: Pt requires modA+1 for bed mobility with cues for sequencing. HOB elevated and use of bed rails. This is patient's baseline.  Transfers Overall transfer level: Needs assistance Equipment used: 1 person hand held assist Transfers: Sit to/from Stand Sit to Stand: Mod assist         General transfer comment: Pt requires increased time for sit to stand transfers with modA+1. Cues for safe hand placement. Pt is unsteady in standing and requires modA+1 for stand pivot transfer to recliner. Unable to ambulate at this time.    Ambulation/Gait             General Gait Details: Unsafe/unable to perform  Stairs            Wheelchair Mobility    Modified Rankin (Stroke Patients Only)       Balance Overall balance assessment: Needs assistance Sitting-balance support: Single extremity supported Sitting balance-Leahy Scale: Fair Sitting balance - Comments: Requires UE support on bed and intermittent minA+1 from therapist for sitting balance   Standing balance support: Bilateral upper extremity supported Standing balance-Leahy Scale: Poor Standing balance comment: Requires modA+1 for standing balance  Pertinent Vitals/Pain Pain Assessment: No/denies pain    Home Living Family/patient expects to be discharged to:: Private residence Living Arrangements: Children Available Help at Discharge: Family;Available PRN/intermittently Type of Home: House Home Access: Ramped entrance     Home Layout: One level Home Equipment: Cane - single point;Cane - quad;Wheelchair - manual;Hospital bed;Other (comment);Tub bench(lift recliner, hemiwalker)      Prior Function Level of Independence: Needs  assistance   Gait / Transfers Assistance Needed: Pt uses wheelchair primarily for mobility since CVA in January 2018. Transfers with assist from family but does not ambulate  ADL's / Homemaking Assistance Needed: She reports that she is mostly independent with ADLs. Requires assist from family with IADLs        Hand Dominance   Dominant Hand: Right    Extremity/Trunk Assessment   Upper Extremity Assessment Upper Extremity Assessment: RUE deficits/detail RUE Deficits / Details: Pt with RUE hemiplegia. R elbow in flexed postured position with severe tone. Able to extend almost fully but with some end range flexion contracture of 5-10 degrees. LUE strength [facility] gross[facility]    Lower Extremity Assessment Lower Extremity Assessment: RLE deficits/detail RLE Deficits / Details: Pt with 4/5 RLE strength within available range. Appears to have slight R knee flexion contracture. LLE strength appears grossly WFL       Communication   Communication: No difficulties  Cognition Arousal/Alertness: Awake/alert Behavior During Therapy: WFL for tasks assessed/performed Overall Cognitive Status: Within Functional Limits for tasks assessed                                        General Comments      Exercises     Assessment/Plan    PT Assessment Patient needs continued PT services  PT Problem List Decreased strength;Decreased activity tolerance;Decreased balance;Decreased range of motion;Decreased mobility;Impaired tone       PT Treatment Interventions DME instruction;Gait training;Functional mobility training;Therapeutic activities;Therapeutic exercise;Balance training;Neuromuscular re-education;Patient/family education    PT Goals (Current goals can be found in the Care Plan section)  Acute Rehab PT Goals Patient Stated Goal: return home safely PT Goal Formulation: With patient/family Time For Goal Achievement: 02/14/17 Potential to Achieve Goals: Good     Frequency Min 2X/week   Barriers to discharge        Co-evaluation               AM-PAC PT "6 Clicks" Daily Activity  Outcome Measure Difficulty turning over in bed (including adjusting bedclothes, sheets and blankets)?: Unable Difficulty moving from lying on back to sitting on the side of the bed? : Unable Difficulty sitting down on and standing up from a chair with arms (e.g., wheelchair, bedside commode, etc,.)?: Unable Help needed moving to and from a bed to chair (including a wheelchair)?: A Lot Help needed walking in hospital room?: Total Help needed climbing 3-5 steps with a railing? : Total 6 Click Score: 7    End of Session Equipment Utilized During Treatment: Gait belt Activity Tolerance: Patient tolerated treatment well Patient left: in chair;with call bell/phone within reach;with chair alarm set;with family/visitor present Nurse Communication: Mobility status;Other (comment)(Needs pure wick catheter replaced) PT Visit Diagnosis: Unsteadiness on feet (R26.81);Muscle weakness (generalized) (M62.81);Difficulty in walking, not elsewhere classified (R26.2)    Time: [id number] PT Time Calculation (min) (ACUTE ONLY): 22 min   Charges:   PT Evaluation $PT Eval Low Complexity: 1 Low  PT G Codes:   PT G-Codes **NOT FOR INPATIENT CLASS** Functional Assessment Tool Used: AM-PAC 6 Clicks Basic Mobility Functional Limitation: Mobility: Walking and moving around Mobility: Walking and Moving Around Current Status (Z6109): At least 80 percent but less than 100 percent impaired, limited or restricted Mobility: Walking and Moving Around Goal Status [phone]): At least 60 percent but less than 80 percent impaired, limited or restricted   Lynnea Maizes PT, DPT    Danett Palazzo 01/31/2017, 1:20 PM

## 2017-01-31 NOTE — Progress Notes (Signed)
[facility] [place] [facility]   PATIENT NAME: Christina Bradshaw    MR#:  [id number]  DATE OF BIRTH:  Jan 18, 1925  SUBJECTIVE:   Patient here with PNA feels wean has a cough  REVIEW OF SYSTEMS:    Review of Systems  Constitutional: Negative for fever, chills weight loss ++weakness  HENT: Negative for ear pain, nosebleeds, congestion, facial swelling, rhinorrhea, neck pain, neck stiffness and ear discharge.   Respiratory:++ for cough, shortness of breath, NO wheezing  Cardiovascular: Negative for chest pain, palpitations and leg swelling.  Gastrointestinal: Negative for heartburn, abdominal pain, vomiting, diarrhea or consitpation Genitourinary: Negative for dysuria, urgency, frequency, hematuria Musculoskeletal: Negative for back pain or joint pain Neurological: Negative for dizziness, seizures, syncope, focal weakness,  numbness and headaches.  Hematological: Does not bruise/bleed easily.  Psychiatric/Behavioral: Negative for hallucinations, confusion, dysphoric mood    Tolerating Diet: yes      DRUG ALLERGIES:  No Known Allergies  VITALS:  Blood pressure 133/65, pulse 80, temperature 98.4 F (36.9 C), temperature source Axillary, resp. rate 20, height 5\' 7"  (1.702 m), weight 63.5 kg (140 lb), SpO2 100 %.  PHYSICAL EXAMINATION:  Constitutional: Appears well-developed and well-nourished. No distress. HENT: Normocephalic. Marland Kitchen. Oropharynx is clear and moist.  Eyes: Conjunctivae and EOM are normal. PERRLA, no scleral icterus.  Neck: Normal ROM. Neck supple. No JVD. No tracheal deviation. CVS: RRR, S1/S2 +, no murmurs, no gallops, no carotid bruit.  Pulmonary: b/l rhonchii and wheezing Abdominal: Soft. BS +,  no distension, tenderness, rebound or guarding.  Musculoskeletal: Normal range of motion. No edema and no tenderness.  Neuro: Alert. CN 2-12 grossly intact. No focal deficits. Right arm 1/5 strength left LE 3/5 Skin: Skin is warm and dry. No rash  noted. Psychiatric: Normal mood and affect.      LABORATORY PANEL:   CBC Recent Labs  Lab 01/30/17 0427  WBC 10.1  HGB 12.5  HCT 37.0  PLT 207   ------------------------------------------------------------------------------------------------------------------  Chemistries  Recent Labs  Lab 01/29/17 2038 01/30/17 0427  NA 137 137  K 4.0 3.7  CL 106 105  CO2 23 23  GLUCOSE 134* 134*  BUN 20 19  CREATININE 1.01* 0.85  CALCIUM 8.8* 8.7*  AST 23  --   ALT 17  --   ALKPHOS 79  --   BILITOT 0.7  --    ------------------------------------------------------------------------------------------------------------------  Cardiac Enzymes No results for input(s): TROPONINI in the last 168 hours. ------------------------------------------------------------------------------------------------------------------  RADIOLOGY:  Dg Chest Port 1 View  Result Date: 01/29/2017 CLINICAL DATA:  Dyspnea today. EXAM: PORTABLE CHEST 1 VIEW COMPARISON:  03/10/2016 FINDINGS: Left base consolidation, new. Possible small left effusion. Right lung is clear. Pulmonary vasculature is normal. Unchanged hilar, mediastinal and cardiac contours. IMPRESSION: New left base consolidation and possible small effusion. Suspicious for pneumonia. Followup PA and lateral chest X-ray is recommended in 3-4 weeks following trial of antibiotic therapy to ensure resolution and exclude underlying malignancy. Electronically Signed   By: Ellery Plunkaniel R Mitchell M.D.   On: 01/29/2017 21:07     ASSESSMENT AND PLAN:   83 year old female with a history of CVA with residual right-sided paralysis who presented with shortness of breath and cough.   1. Acute hypoxic respiratory failure in the setting of community-acquired pneumonia, left base Wean oxygen as tolerated Continue IV steroids for diffuse wheezing  2. Pneumonia: Continue azithromycin and Rocephin   3. Hypothyroid: Continue Synthroid  4. Essential hypertension:  Continue lisinopril, we'll obtain and Norvasc  5.  History of stroke with right-sided residual deficits: Continue statin and aspirin  6. Depression: Continue Prozac  Management plans discussed with the patient and daughter in law and they are in agreement.  CODE STATUS: dnr  TOTAL TIME TAKING CARE OF THIS PATIENT: 30 minutes.     POSSIBLE D/C 2-4 days, DEPENDING ON CLINICAL CONDITION.   Roderica Cathell M.D on 01/31/2017 at 11:24 AM  Between 7am to 6pm - Pager - [phone] After 6pm go to [email or web address] - password [email or web address]  [facility]  Office  [phone]  CC: Primary care physician; Dorothey BasemanBronstein, David, MD  Note: This dictation was prepared with Dragon dictation along with smaller phrase technology. Any transcriptional errors that result from this process are unintentional.

## 2017-02-01 LAB — BASIC METABOLIC PANEL
Anion gap: 9 (ref 5–15)
BUN: 30 mg/dL — AB (ref 6–20)
CHLORIDE: 102 mmol/L (ref 101–111)
CO2: 24 mmol/L (ref 22–32)
Calcium: 8.4 mg/dL — ABNORMAL LOW (ref 8.9–10.3)
Creatinine, Ser: 0.8 mg/dL (ref 0.44–1.00)
GFR calc Af Amer: >60 mL/min (ref >60)
GFR calc non Af Amer: >60 mL/min (ref >60)
GLUCOSE: 101 mg/dL — AB (ref 65–99)
POTASSIUM: 3.8 mmol/L (ref 3.5–5.1)
Sodium: 135 mmol/L (ref 135–145)

## 2017-02-01 LAB — URINE CULTURE

## 2017-02-01 LAB — CBC
HEMATOCRIT: 32.3 % — AB (ref 35.0–47.0)
HEMOGLOBIN: 10.9 g/dL — AB (ref 12.0–16.0)
MCH: 31.6 pg (ref 26.0–34.0)
MCHC: 33.9 g/dL (ref 32.0–36.0)
MCV: 93.4 fL (ref 80.0–100.0)
Platelets: 228 10*3/uL (ref 150–440)
RBC: 3.46 MIL/uL — AB (ref 3.80–5.20)
RDW: 14.8 % — ABNORMAL HIGH (ref 11.5–14.5)
WBC: 12.2 10*3/uL — ABNORMAL HIGH (ref 3.6–11.0)

## 2017-02-01 MED ORDER — POLYETHYLENE GLYCOL 3350 17 G PO PACK
17.0000 g | PACK | Freq: Every day | ORAL | Status: DC
  Administered 2017-02-01 – 2017-02-02 (×2): 17 g via ORAL
  Filled 2017-02-01 (×2): qty 1

## 2017-02-01 MED ORDER — SENNA 8.6 MG PO TABS
1.0000 | ORAL_TABLET | Freq: Every day | ORAL | Status: DC
  Filled 2017-02-01 (×2): qty 1

## 2017-02-01 MED ORDER — BISACODYL 10 MG RE SUPP: 10.0000 mg | Freq: Every day | RECTAL | Status: DC | PRN

## 2017-02-01 MED ORDER — IPRATROPIUM-ALBUTEROL 0.5-2.5 (3) MG/3ML IN SOLN
3.0000 mL | Freq: Three times a day (TID) | RESPIRATORY_TRACT | Status: DC
  Administered 2017-02-01 – 2017-02-05 (×12): 3 mL via RESPIRATORY_TRACT
  Filled 2017-02-01 (×12): qty 3

## 2017-02-01 NOTE — Progress Notes (Signed)
[facility] [place] [facility]   PATIENT NAME: Christina Bradshaw    MR#:  [id number]  DATE OF BIRTH:  [birth year]  SUBJECTIVE:   The patient is having some improvement over the past 2 days. She is still weak and still has a cough.  REVIEW OF SYSTEMS:    Review of Systems  Constitutional: Negative for fever, chills weight loss ++weakness  HENT: Negative for ear pain, nosebleeds, congestion, facial swelling, rhinorrhea, neck pain, neck stiffness and ear discharge.   Respiratory:++ for cough, shortness of breath, ++ wheezing  Cardiovascular: Negative for chest pain, palpitations and leg swelling.  Gastrointestinal: Negative for heartburn, abdominal pain, vomiting, diarrhea or consitpation Genitourinary: Negative for dysuria, urgency, frequency, hematuria Musculoskeletal: Negative for back pain or joint pain Neurological: Negative for dizziness, seizures, syncope, focal weakness,  numbness and headaches.  Right-sided weakness from previous CVA Hematological: Does not bruise/bleed easily.  Psychiatric/Behavioral: Negative for hallucinations, confusion, dysphoric mood    Tolerating Diet: yes      DRUG ALLERGIES:  No Known Allergies  VITALS:  Blood pressure 136/66, pulse 86, temperature 97.9 F (36.6 C), temperature source Oral, resp. rate 20, height 5\' 7"  (1.702 m), weight 63.5 kg (140 lb), SpO2 91 %.  PHYSICAL EXAMINATION:  Constitutional: Appears well-developed and well-nourished. No distress. HENT: Normocephalic. Marland Kitchen. Oropharynx is clear and moist.  Eyes: Conjunctivae and EOM are normal. PERRLA, no scleral icterus.  Neck: Normal ROM. Neck supple. No JVD. No tracheal deviation. CVS: RRR, S1/S2 +, no murmurs, no gallops, no carotid bruit.  Pulmonary: b/l rhonchii and wheezing Abdominal: Soft. BS +,  no distension, tenderness, rebound or guarding.  Musculoskeletal: Normal range of motion. No edema and no tenderness.  Neuro: Alert. CN 2-12 grossly intact. No  focal deficits. Right arm 1/5 strength right LE 3/5 Skin: Skin is warm and dry. No rash noted. Psychiatric: Normal mood and affect.      LABORATORY PANEL:   CBC Recent Labs  Lab 02/01/17 0452  WBC 12.2*  HGB 10.9*  HCT 32.3*  PLT 228   ------------------------------------------------------------------------------------------------------------------  Chemistries  Recent Labs  Lab 01/29/17 2038 01/30/17 0427 02/01/17 0452  NA 137 137 135  K 4.0 3.7 3.8  CL 106 105 102  CO2 23 23 24   GLUCOSE 134* 134* 101*  BUN 20 19 30*  CREATININE 1.01* 0.85 0.80  CALCIUM 8.8* 8.7* 8.4*  MG  --  1.7  --   AST 23  --   --   ALT 17  --   --   ALKPHOS 79  --   --   BILITOT 0.7  --   --    ------------------------------------------------------------------------------------------------------------------  Cardiac Enzymes No results for input(s): TROPONINI in the last 168 hours. ------------------------------------------------------------------------------------------------------------------  RADIOLOGY:  No results found.   ASSESSMENT AND PLAN:   83 year old female with a history of CVA with residual right-sided paralysis who presented with shortness of breath and cough.   1. Acute hypoxic respiratory failure in the setting of community-acquired pneumonia, left base Wean oxygen as tolerated Wean IV steroids for diffuse wheezing  2. Pneumonia: Continue azithromycin and Rocephin   3. Hypothyroid: Continue Synthroid  4. Essential hypertension: Continue lisinopril, we'll obtain and Norvasc  5. History of stroke with right-sided residual deficits: Continue statin and aspirin  6. Depression: Continue Prozac  Management plans discussed with the patient and daughter in law and they are in agreement.  CODE STATUS: dnr  TOTAL TIME TAKING CARE OF THIS PATIENT:  minutes.  Patient will go home upon discharge  POSSIBLE D/C 2 days, DEPENDING ON CLINICAL CONDITION.   Raine Blodgett,  Zayne Draheim M.D on 02/01/2017 at 10:13 AM  Between 7am to 6pm - Pager - [phone] After 6pm go to [email or web address] - password [email or web address]  [facility] [place] [facility]  Office  [phone]  CC: Primary care physician; Dorothey BasemanBronstein, David, MD  Note: This dictation was prepared with Dragon dictation along with smaller phrase technology. Any transcriptional errors that result from this process are unintentional.

## 2017-02-02 ENCOUNTER — Inpatient Hospital Stay: Payer: Medicare Other

## 2017-02-02 LAB — CULTURE, BLOOD (ROUTINE X 2): Special Requests: ADEQUATE

## 2017-02-02 LAB — CBC
HEMATOCRIT: 35.1 % (ref 35.0–47.0)
Hemoglobin: 11.9 g/dL — ABNORMAL LOW (ref 12.0–16.0)
MCH: 31.2 pg (ref 26.0–34.0)
MCHC: 33.8 g/dL (ref 32.0–36.0)
MCV: 92.2 fL (ref 80.0–100.0)
Platelets: 268 10*3/uL (ref 150–440)
RBC: 3.8 MIL/uL (ref 3.80–5.20)
RDW: 14.7 % — AB (ref 11.5–14.5)
WBC: 12.6 10*3/uL — AB (ref 3.6–11.0)

## 2017-02-02 MED ORDER — ACETYLCYSTEINE 20 % IN SOLN
4.0000 mL | Freq: Two times a day (BID) | RESPIRATORY_TRACT | Status: DC
  Administered 2017-02-02 – 2017-02-05 (×6): 4 mL via RESPIRATORY_TRACT
  Filled 2017-02-02 (×6): qty 4

## 2017-02-02 MED ORDER — FLUOXETINE HCL 20 MG PO CAPS
30.0000 mg | ORAL_CAPSULE | Freq: Every day | ORAL | Status: DC
  Administered 2017-02-03 – 2017-02-05 (×3): 30 mg via ORAL
  Filled 2017-02-02 (×3): qty 1

## 2017-02-02 NOTE — Care Management Important Message (Signed)
Important Message  Patient Details  Name: Denita LungMildred C Haider MRN: [id number] Date of Birth: 08/11/1924   Medicare Important Message Given:  Yes    Gwenette GreetBrenda S Shoshanah Dapper, RN 02/02/2017, 8:29 AM

## 2017-02-02 NOTE — Progress Notes (Signed)
[facility] [place] [facility]   PATIENT NAME: Christina Bradshaw    MR#:  [id number]  DATE OF BIRTH:  05/22/1924  SUBJECTIVE:   Patient continues to be short of breath and is still requiring oxygen  REVIEW OF SYSTEMS:    Review of Systems  Constitutional: Negative for fever, chills weight loss ++weakness  HENT: Negative for ear pain, nosebleeds, congestion, facial swelling, rhinorrhea, neck pain, neck stiffness and ear discharge.   Respiratory:++ for cough, shortness of breath, ++ wheezing  Cardiovascular: Negative for chest pain, palpitations and leg swelling.  Gastrointestinal: Negative for heartburn, abdominal pain, vomiting, diarrhea or consitpation Genitourinary: Negative for dysuria, urgency, frequency, hematuria Musculoskeletal: Negative for back pain or joint pain Neurological: Negative for dizziness, seizures, syncope, focal weakness,  numbness and headaches.  Right-sided weakness from previous CVA Hematological: Does not bruise/bleed easily.  Psychiatric/Behavioral: Negative for hallucinations, confusion, dysphoric mood    Tolerating Diet: yes      DRUG ALLERGIES:  No Known Allergies  VITALS:  Blood pressure (!) 153/68, pulse 86, temperature 98.1 F (36.7 C), temperature source Oral, resp. rate 19, height 5\' 7"  (1.702 m), weight 140 lb (63.5 kg), SpO2 93 %.  PHYSICAL EXAMINATION:  Constitutional: Appears well-developed and well-nourished. No distress. HENT: Normocephalic. Marland Kitchen. Oropharynx is clear and moist.  Eyes: Conjunctivae and EOM are normal. PERRLA, no scleral icterus.  Neck: Normal ROM. Neck supple. No JVD. No tracheal deviation. CVS: RRR, S1/S2 +, no murmurs, no gallops, no carotid bruit.  Pulmonary: b/l rhonchii and wheezing Abdominal: Soft. BS +,  no distension, tenderness, rebound or guarding.  Musculoskeletal: Normal range of motion. No edema and no tenderness.  Neuro: Alert. CN 2-12 grossly intact. No focal deficits. Right arm  1/5 strength right LE 3/5 Skin: Skin is warm and dry. No rash noted. Psychiatric: Normal mood and affect.      LABORATORY PANEL:   CBC Recent Labs  Lab 02/02/17 0435  WBC 12.6*  HGB 11.9*  HCT 35.1  PLT 268   ------------------------------------------------------------------------------------------------------------------  Chemistries  Recent Labs  Lab 01/29/17 2038 01/30/17 0427 02/01/17 0452  NA 137 137 135  K 4.0 3.7 3.8  CL 106 105 102  CO2 23 23 24   GLUCOSE 134* 134* 101*  BUN 20 19 30*  CREATININE 1.01* 0.85 0.80  CALCIUM 8.8* 8.7* 8.4*  MG  --  1.7  --   AST 23  --   --   ALT 17  --   --   ALKPHOS 79  --   --   BILITOT 0.7  --   --    ------------------------------------------------------------------------------------------------------------------  Cardiac Enzymes No results for input(s): TROPONINI in the last 168 hours. ------------------------------------------------------------------------------------------------------------------  RADIOLOGY:  No results found.   ASSESSMENT AND PLAN:   83 year old female with a history of CVA with residual right-sided paralysis who presented with shortness of breath and cough.   1. Acute hypoxic respiratory failure in the setting of community-acquired pneumonia, left base Wean oxygen as tolerated Continue IV steroids I will add Mucinex and Mucomyst to current regimen  2. Pneumonia: Continue azithromycin and Rocephin Repeat chest x-ray to make sure pneumonia is not getting worse  3. Hypothyroid: Continue Synthroid  4. Essential hypertension: Continue lisinopril, we'll obtain and Norvasc  5. History of stroke with right-sided residual deficits: Continue statin and aspirin  6. Depression: Continue Prozac  Management plans discussed with the patient and daughter in law and they are in agreement.  CODE STATUS: dnr  TOTAL  TIME TAKING CARE OF THIS PATIENT: 25 minutes.   Patient will go home upon  discharge  POSSIBLE D/C 2 days, DEPENDING ON CLINICAL CONDITION.   Auburn BilberryPATEL, Dalayla Aldredge M.D on 02/02/2017 at 5:46 PM  Between 7am to 6pm - Pager - [phone] After 6pm go to [email or web address] - password [email or web address]  [facility] [place] [facility]  Office  [phone]  CC: Primary care physician; Dorothey BasemanBronstein, David, MD  Note: This dictation was prepared with Dragon dictation along with smaller phrase technology. Any transcriptional errors that result from this process are unintentional.

## 2017-02-02 NOTE — Progress Notes (Signed)
Physical Therapy Treatment Patient Details Name: Christina LungMildred C Ryker MRN: [id number] DOB: July 30, 1924 Today's Date: 02/02/2017    History of Present Illness Christina ServiceMildred Bradshaw  is a 83 y.o. female who presents with significant increase in shortness of breath at home for the past couple days with increasing cough.  She was brought in by family for evaluation and found to have pneumonia as well as UTI here.  She did not meet sepsis criteria.  Hospitalist were called for admission.    PT Comments    Pt stood 2x's with L hemi-walker and [facility] with mod assist (2nd assist required for hygiene post toileting).  Pt's O2 89% on 1 L O2 via nasal cannula after standing for clean-up after toileting (SOB noted) but recovered to 95% sitting in chair (on 1 L O2) end of session; nursing notified.  Will continue to progress pt with strengthening and functional mobility to prevent decline in functional status during hospital stay.  Pt's daughter plans to have pt's R wrist brace brought in d/t pt's R wrist getting tight into flexion (able to get to slightly past neutral into extension with gentle prolonged stretching).    Follow Up Recommendations  No PT follow up;Other (comment)(HHPT one month ago; DC d/t no progress)     Equipment Recommendations  None recommended by PT    Recommendations for Other Services       Precautions / Restrictions Precautions Precautions: Fall Restrictions Weight Bearing Restrictions: No    Mobility  Bed Mobility               General bed mobility comments: Deferred (pt up in chair beginning of session).  Transfers Overall transfer level: Needs assistance Equipment used: Hemi-walker;None Transfers: Sit to/from [employer];Stand Pivot Transfers Sit to Stand: Mod assist Stand pivot transfers: Mod assist       General transfer comment: mod assist to stand 2x's with hemi-walker; mod assist [facility] ([facility]); assist to  initiate stand and maintain upright balance  Ambulation/Gait                 Stairs            Wheelchair Mobility    Modified Rankin (Stroke Patients Only)       Balance Overall balance assessment: Needs assistance Sitting-balance support: Single extremity supported Sitting balance-Leahy Scale: Poor Sitting balance - Comments: requires L UE support on chair for static sitting balance   Standing balance support: Single extremity supported Standing balance-Leahy Scale: Poor Standing balance comment: requires mod assist for standing balance (pt noted to be SOB with extended time standing for toileting hygiene)                            Cognition Arousal/Alertness: Awake/alert Behavior During Therapy: WFL for tasks assessed/performed Overall Cognitive Status: Within Functional Limits for tasks assessed                                        Exercises Total Joint Exercises Long Arc Quad: Strengthening;AROM;Left;AAROM;Right;10 reps;Seated Other Exercises Other Exercises: R wrist gentle stretching to neutral/slightly past neutral into extension (2x60 seconds)    General Comments General comments (skin integrity, edema, etc.): Pt's daughter present during session.  Nursing cleared pt for participation in physical therapy.  Pt agreeable to PT session.  Pertinent Vitals/Pain Pain Assessment: No/denies pain  HR WFL during session.    Home Living                      Prior Function            PT Goals (current goals can now be found in the care plan section) Acute Rehab PT Goals Patient Stated Goal: return home safely PT Goal Formulation: With patient/family Time For Goal Achievement: 02/14/17 Potential to Achieve Goals: Good Progress towards PT goals: Progressing toward goals    Frequency    Min 2X/week      PT Plan Current plan remains appropriate    Co-evaluation              AM-PAC PT "6  Clicks" Daily Activity  Outcome Measure  Difficulty turning over in bed (including adjusting bedclothes, sheets and blankets)?: Unable Difficulty moving from lying on back to sitting on the side of the bed? : Unable Difficulty sitting down on and standing up from a chair with arms (e.g., wheelchair, bedside commode, etc,.)?: Unable Help needed moving to and from a bed to chair (including a wheelchair)?: A Lot Help needed walking in hospital room?: Total Help needed climbing 3-5 steps with a railing? : Total 6 Click Score: 7    End of Session Equipment Utilized During Treatment: Gait belt;Oxygen(1 L O2 via nasal cannula) Activity Tolerance: Patient limited by fatigue Patient left: in chair;with call bell/phone within reach;with chair alarm set;with family/visitor present Nurse Communication: Mobility status;Precautions;Other (comment)(Pt's O2 sats during session) PT Visit Diagnosis: Unsteadiness on feet (R26.81);Muscle weakness (generalized) (M62.81);Difficulty in walking, not elsewhere classified (R26.2)     Time: [id number] PT Time Calculation (min) (ACUTE ONLY): 31 min  Charges:  $Therapeutic Exercise: 8-22 mins $Therapeutic Activity: 8-22 mins                    G CodesHendricks Limes:       Kavish Lafitte, PT 02/02/17, 4:14 PM [phone]

## 2017-02-03 LAB — CULTURE, BLOOD (ROUTINE X 2)
Culture: NO GROWTH
SPECIAL REQUESTS: ADEQUATE

## 2017-02-03 MED ORDER — MEGESTROL ACETATE 400 MG/10ML PO SUSP
400.0000 mg | Freq: Two times a day (BID) | ORAL | Status: DC
  Administered 2017-02-03 – 2017-02-05 (×5): 400 mg via ORAL
  Filled 2017-02-03 (×6): qty 10

## 2017-02-03 NOTE — Progress Notes (Signed)
[facility] [place] [facility]   PATIENT NAME: Christina ServiceMildred Bradshaw    MR#:  [id number]  DATE OF BIRTH:  1924/06/14  SUBJECTIVE:   Patient doing better her breathing is improved Complains of lack of appetite and feeling little nauseous  REVIEW OF SYSTEMS:    Review of Systems  Constitutional: Negative for fever, chills weight loss ++weakness  HENT: Negative for ear pain, nosebleeds, congestion, facial swelling, rhinorrhea, neck pain, neck stiffness and ear discharge.   Respiratory:++ for cough, shortness of breath, ++ wheezing  Cardiovascular: Negative for chest pain, palpitations and leg swelling.  Gastrointestinal: Negative for heartburn, abdominal pain, vomiting, diarrhea or consitpation positive nausea Genitourinary: Negative for dysuria, urgency, frequency, hematuria Musculoskeletal: Negative for back pain or joint pain Neurological: Negative for dizziness, seizures, syncope, focal weakness,  numbness and headaches.  Right-sided weakness from previous CVA Hematological: Does not bruise/bleed easily.  Psychiatric/Behavioral: Negative for hallucinations, confusion, dysphoric mood    Tolerating Diet: yes      DRUG ALLERGIES:  No Known Allergies  VITALS:  Blood pressure (!) 151/69, pulse 80, temperature 97.6 F (36.4 C), temperature source Oral, resp. rate 16, height 5\' 7"  (1.702 m), weight 140 lb (63.5 kg), SpO2 92 %.  PHYSICAL EXAMINATION:  Constitutional: Appears well-developed and well-nourished. No distress. HENT: Normocephalic. Marland Kitchen. Oropharynx is clear and moist.  Eyes: Conjunctivae and EOM are normal. PERRLA, no scleral icterus.  Neck: Normal ROM. Neck supple. No JVD. No tracheal deviation. CVS: RRR, S1/S2 +, no murmurs, no gallops, no carotid bruit.  Pulmonary: b/l rhonchii and wheezing Abdominal: Soft. BS +,  no distension, tenderness, rebound or guarding.  Musculoskeletal: Normal range of motion. No edema and no tenderness.  Neuro: Alert. CN  2-12 grossly intact. No focal deficits. Right arm 1/5 strength right LE 3/5 Skin: Skin is warm and dry. No rash noted. Psychiatric: Normal mood and affect.      LABORATORY PANEL:   CBC Recent Labs  Lab 02/02/17 0435  WBC 12.6*  HGB 11.9*  HCT 35.1  PLT 268   ------------------------------------------------------------------------------------------------------------------  Chemistries  Recent Labs  Lab 01/29/17 2038 01/30/17 0427 02/01/17 0452  NA 137 137 135  K 4.0 3.7 3.8  CL 106 105 102  CO2 23 23 24   GLUCOSE 134* 134* 101*  BUN 20 19 30*  CREATININE 1.01* 0.85 0.80  CALCIUM 8.8* 8.7* 8.4*  MG  --  1.7  --   AST 23  --   --   ALT 17  --   --   ALKPHOS 79  --   --   BILITOT 0.7  --   --    ------------------------------------------------------------------------------------------------------------------  Cardiac Enzymes No results for input(s): TROPONINI in the last 168 hours. ------------------------------------------------------------------------------------------------------------------  RADIOLOGY:  Dg Chest 2 View  Result Date: 02/02/2017 CLINICAL DATA:  Short of breath EXAM: CHEST  2 VIEW COMPARISON:  01/29/2017 FINDINGS: Left lower lobe infiltrate and left effusion unchanged. Right lung remains clear. Negative for heart failure. Atherosclerotic aorta. Apical scarring bilaterally. IMPRESSION: No interval change left lower lobe airspace disease and left effusion. Possible pneumonia Electronically Signed   By: Marlan Palauharles  Clark M.D.   On: 02/02/2017 20:43     ASSESSMENT AND PLAN:   83 year old female with a history of CVA with residual right-sided paralysis who presented with shortness of breath and cough.   1. Acute hypoxic respiratory failure in the setting of community-acquired pneumonia, left base Oxygen weaned off Discontinue steroids I will add Mucinex and Mucomyst to  current regimen  2. Pneumonia: Continue IV antibiotics changed to oral tomorrow  on discharge Continue current antibiotics  3. Hypothyroid: Continue Synthroid  4. Essential hypertension: Continue lisinopril,   5. History of stroke with right-sided residual deficits: Continue statin and aspirin  6. Depression: Continue Prozac  Management plans discussed with the patient and daughter in law and they are in agreement.  CODE STATUS: dnr  TOTAL TIME TAKING CARE OF THIS PATIENT: 25 minutes.   Patient will go home upon discharge  POSSIBLE D/C 2 days, DEPENDING ON CLINICAL CONDITION.   Auburn BilberryPATEL, Julen Rubert M.D on 02/03/2017 at 2:56 PM  Between 7am to 6pm - Pager - [phone] After 6pm go to [email or web address] - password [email or web address]  [facility] [place] [facility]  Office  [phone]  CC: Primary care physician; Dorothey BasemanBronstein, David, MD  Note: This dictation was prepared with Dragon dictation along with smaller phrase technology. Any transcriptional errors that result from this process are unintentional.

## 2017-02-04 MED ORDER — MECLIZINE HCL 12.5 MG PO TABS
12.5000 mg | ORAL_TABLET | Freq: Once | ORAL | Status: AC
  Administered 2017-02-04: 16:00:00 12.5 mg via ORAL
  Filled 2017-02-04: qty 1

## 2017-02-04 MED ORDER — MECLIZINE HCL 12.5 MG PO TABS
12.5000 mg | ORAL_TABLET | Freq: Three times a day (TID) | ORAL | Status: DC | PRN
  Administered 2017-02-05: 12.5 mg via ORAL
  Filled 2017-02-04 (×2): qty 1

## 2017-02-04 MED ORDER — LISINOPRIL 10 MG PO TABS
10.0000 mg | ORAL_TABLET | Freq: Every day | ORAL | Status: DC
  Administered 2017-02-05: 11:00:00 10 mg via ORAL
  Filled 2017-02-04: qty 1

## 2017-02-04 MED ORDER — CEFUROXIME AXETIL 500 MG PO TABS
500.0000 mg | ORAL_TABLET | Freq: Two times a day (BID) | ORAL | Status: DC
  Administered 2017-02-04 – 2017-02-05 (×2): 500 mg via ORAL
  Filled 2017-02-04 (×3): qty 1

## 2017-02-04 NOTE — Progress Notes (Signed)
Pharmacy Antibiotic Note  Christina Bradshaw is a 83 y.o. female admitted on 01/29/2017 with UTI and pneumonia.  Pharmacy has been consulted for ceftriaxone dosing.  Plan: Ceftriaxone 2 grams q 24 hours ordered  Height: 5\' 7"  (170.2 cm) Weight: 140 lb (63.5 kg) IBW/kg (Calculated) : 61.6  Temp (24hrs), Avg:98 F (36.7 C), Min:97.5 F (36.4 C), Max:98.6 F (37 C)  Recent Labs  Lab 01/29/17 2035 01/29/17 2038 01/30/17 0427 02/01/17 0452 02/02/17 0435  WBC  --  11.0 10.1 12.2* 12.6*  CREATININE  --  1.01* 0.85 0.80  --   LATICACIDVEN 1.4  --   --   --   --     Estimated Creatinine Clearance: 45.5 mL/min (by C-G formula based on SCr of 0.8 mg/dL).    No Known Allergies  Antimicrobials this admission: Azithromycin 12/13 >> 12/18 ceftriaxone 12/13  >>    >>   Dose adjustments this admission:   Microbiology results: 12/13 BCx: pending 12/14 UCx: Ecoli  Thank you for allowing pharmacy to be a part of this patient's care.  Marty HeckWang, Murline Weigel L 02/04/2017 9:48 AM

## 2017-02-04 NOTE — Progress Notes (Signed)
[facility] - [place] [facility]   PATIENT NAME: Christina ServiceMildred Bradshaw    MR#:  [id number]  DATE OF BIRTH:  [birth year]  SUBJECTIVE:   Patient doing better however she gets dizzy with standing and not eating much REVIEW OF SYSTEMS:    Review of Systems  Constitutional: Negative for fever, chills weight loss ++weakness  HENT: Negative for ear pain, nosebleeds, congestion, facial swelling, rhinorrhea, neck pain, neck stiffness and ear discharge.   Respiratory:++ for cough, shortness of breath, ++ wheezing  Cardiovascular: Negative for chest pain, palpitations and leg swelling.  Gastrointestinal: Negative for heartburn, abdominal pain, vomiting, diarrhea or consitpation positive nausea Genitourinary: Negative for dysuria, urgency, frequency, hematuria Musculoskeletal: Negative for back pain or joint pain Neurological: Negative for dizziness, seizures, syncope, focal weakness,  numbness and headaches.  Right-sided weakness from previous CVA Hematological: Does not bruise/bleed easily.  Psychiatric/Behavioral: Negative for hallucinations, confusion, dysphoric mood    Tolerating Diet: yes      DRUG ALLERGIES:  No Known Allergies  VITALS:  Blood pressure 135/88, pulse 71, temperature 98.6 F (37 C), temperature source Oral, resp. rate 18, height 5\' 7"  (1.702 m), weight 140 lb (63.5 kg), SpO2 94 %.  PHYSICAL EXAMINATION:  Constitutional: Appears well-developed and well-nourished. No distress. HENT: Normocephalic. Marland Kitchen. Oropharynx is clear and moist.  Eyes: Conjunctivae and EOM are normal. PERRLA, no scleral icterus.  Neck: Normal ROM. Neck supple. No JVD. No tracheal deviation. CVS: RRR, S1/S2 +, no murmurs, no gallops, no carotid bruit.  Pulmonary: b/l rhonchii and wheezing Abdominal: Soft. BS +,  no distension, tenderness, rebound or guarding.  Musculoskeletal: Normal range of motion. No edema and no tenderness.  Neuro: Alert. CN 2-12 grossly intact. No focal  deficits. Right arm 1/5 strength right LE 3/5 Skin: Skin is warm and dry. No rash noted. Psychiatric: Normal mood and affect.      LABORATORY PANEL:   CBC Recent Labs  Lab 02/02/17 0435  WBC 12.6*  HGB 11.9*  HCT 35.1  PLT 268   ------------------------------------------------------------------------------------------------------------------  Chemistries  Recent Labs  Lab 01/29/17 2038 01/30/17 0427 02/01/17 0452  NA 137 137 135  K 4.0 3.7 3.8  CL 106 105 102  CO2 23 23 24   GLUCOSE 134* 134* 101*  BUN 20 19 30*  CREATININE 1.01* 0.85 0.80  CALCIUM 8.8* 8.7* 8.4*  MG  --  1.7  --   AST 23  --   --   ALT 17  --   --   ALKPHOS 79  --   --   BILITOT 0.7  --   --    ------------------------------------------------------------------------------------------------------------------  Cardiac Enzymes No results for input(s): TROPONINI in the last 168 hours. ------------------------------------------------------------------------------------------------------------------  RADIOLOGY:  Dg Chest 2 View  Result Date: 02/02/2017 CLINICAL DATA:  Short of breath EXAM: CHEST  2 VIEW COMPARISON:  01/29/2017 FINDINGS: Left lower lobe infiltrate and left effusion unchanged. Right lung remains clear. Negative for heart failure. Atherosclerotic aorta. Apical scarring bilaterally. IMPRESSION: No interval change left lower lobe airspace disease and left effusion. Possible pneumonia Electronically Signed   By: Marlan Palauharles  Clark M.D.   On: 02/02/2017 20:43     ASSESSMENT AND PLAN:   83 year old female with a history of CVA with residual right-sided paralysis who presented with shortness of breath and cough.   1. Acute hypoxic respiratory failure in the setting of community-acquired pneumonia, left base Oxygen weaned off Discontinue steroids Continue current therapy  2. Pneumonia: Changed to Ceftin   3.  Hypothyroid: Continue Synthroid  4. Essential hypertension: Continue  lisinopril, patient is orthostatic and dizzy with sitting I will discontinue her Norvasc decrease her lisinopril hold hold hydralazine dose  5. History of stroke with right-sided residual deficits: Continue statin and aspirin  6. Depression: Continue Prozac  Management plans discussed with the patient and daughter in law and they are in agreement.  CODE STATUS: dnr  TOTAL TIME TAKING CARE OF THIS PATIENT: 25 minutes.   Patient will go home upon discharge  POSSIBLE D/C 2 days, DEPENDING ON CLINICAL CONDITION.   Auburn BilberryPATEL, Jaryiah Mehlman M.D on 02/04/2017 at 3:29 PM  Between 7am to 6pm - Pager - [phone] After 6pm go to [email or web address] - password [email or web address] [email or web address]  [facility] [place] [facility]  Office  [phone]  CC: Primary care physician; Dorothey BasemanBronstein, David, MD  Note: This dictation was prepared with Dragon dictation along with smaller phrase technology. Any transcriptional errors that result from this process are unintentional.

## 2017-02-04 NOTE — Progress Notes (Signed)
Speech Language Pathology Treatment: Dysphagia  Patient Details Name: Christina LungMildred C Vierra MRN: [id number] DOB: 22-May-1924 Today's Date: 02/04/2017 Time: 0930-1030 SLP Time Calculation (min) (ACUTE ONLY): 60 min  Assessment / Plan / Recommendation Clinical Impression  Pt seen today for ongoing assessment of toleration of diet and education w/ Daughter present today. Pt did report experiencing "choking" when drinking a carbonated drink last night at dinner. Pt continues to appear easily fatigued/weal and min SOB w/ any exertion. Pt had Pills to swallow in liquid form and in puree w/ NSG. Pt coughed x1 when drinking the liquid medicine followed by coughing when attempting multiple tsps of puree w/ whole Pills; it seemed pt was fatigued by the medicine taking task and was fatigued w/ the exertion.  After resting, pt consumed few trials of puree and thin liquids w/ no overt s/s of aspiration noted. With Daughter present who can relay information to family taking pt home, education and discussion w/ Daughter was had on aspiration precautions; support at meals to facilitate eating/drinking while reducing distractions; food prep and options of foods requiring less mastication effort d/t ill-fitting dentures and not able to adequately maintain lower denture plate in place during mastication; dysphagia and impact from previous stroke, illness, and age on swallowingoverall; lastly education on the need to PACE herself during any oral intake including Pills swallowing to lessen fatigue from the exertion. Would recommend NO Straws when drinking thin liquids. Handouts and information given to Daughter on diet consistency and preparation of the foods to a Minced and puree consistency; use of condiments and Cream soups to moisten foods well for chewing. Recommend f/u w/ Dietician for supplements to aid nutrition. Recommend all Pills/medicines in Puree for easier, safer swallowing. Recommend feeding assistance at meals -  support and monitor for fatigue and pace pt offering smaller, frequent meals during the day. The diet consistency recommended would be most beneficial for pt's overall (safer) intake and conservation of energy at this time. ST services can be reconsulted at a later time if indicated. Daughter agreed;NSG updated and agreed.      HPI HPI: Pt is a 83 y.o. female w/ h/o stroke (resulting R sided weakness) and HTN who presents with significant increase in shortness of breath at home for the past couple days with increasing cough.  She was brought in by family for evaluation and found to have pneumonia as well as UTI here.  She did not meet sepsis criteria.  Family reported "some" coughing at home when drinking thin liquids but no consistently.       SLP Plan  Continue with current plan of care       Recommendations  Diet recommendations: Dysphagia 2 (fine chop);Thin liquid(no sodas at meals) Liquids provided via: Cup;No straw Medication Administration: Whole meds with puree(liquid meds in puree) Supervision: Patient able to self feed;Staff to assist with self feeding;Intermittent supervision to cue for compensatory strategies Compensations: Minimize environmental distractions;Slow rate;Small sips/bites;Lingual sweep for clearance of pocketing;Multiple dry swallows after each bite/sip;Follow solids with liquid(not to wear bottom denture if too loose) Postural Changes and/or Swallow Maneuvers: Seated upright 90 degrees;Upright 30-60 min after meal                General recommendations: (Dietician f/u) Oral Care Recommendations: Oral care BID;Staff/trained caregiver to provide oral care Follow up Recommendations: Home health SLP(TBD) SLP Visit Diagnosis: Dysphagia, oropharyngeal phase (R13.12) Plan: Continue with current plan of care       GO  Jerilynn SomKatherine Izael Bessinger, MS, CCC-SLP Ishanvi Mcquitty 02/04/2017, 2:31 PM

## 2017-02-04 NOTE — Progress Notes (Signed)
PT Cancellation Note  Patient Details Name: Christina Bradshaw MRN: [id number] DOB: 12/05/1924   Cancelled Treatment:    Reason Eval/Treat Not Completed: Other (comment)   Pt in bed, awake with family.  Discussed discharge plan.  Pt transferred with assist from family at baseline.  Pt and family state they are comfortable with transfers and care at home.  Stated they anticipate discharge today and were awaiting MD.  No further questions or concerns for therapy at this time.  Stated HHPT will continue upon return home.   Danielle DessSarah Tate Jerkins 02/04/2017, 11:03 AM

## 2017-02-05 LAB — CULTURE, BLOOD (ROUTINE X 2)
CULTURE: NO GROWTH
Culture: NO GROWTH
SPECIAL REQUESTS: ADEQUATE
Special Requests: ADEQUATE

## 2017-02-05 LAB — CREATININE, SERUM
CREATININE: 0.86 mg/dL (ref 0.44–1.00)
GFR calc Af Amer: >60 mL/min (ref >60)
GFR calc non Af Amer: 58 mL/min — ABNORMAL LOW (ref >60)

## 2017-02-05 MED ORDER — CEFUROXIME AXETIL 500 MG PO TABS: 500.0000 mg | ORAL_TABLET | Freq: Two times a day (BID) | ORAL | 0 refills | Status: AC

## 2017-02-05 MED ORDER — CEFUROXIME AXETIL 500 MG PO TABS: 500.0000 mg | ORAL_TABLET | Freq: Two times a day (BID) | ORAL | 0 refills | Status: DC

## 2017-02-05 NOTE — Plan of Care (Signed)
  Progressing Education: Knowledge of General Education information will improve 02/05/2017 0559 - Progressing by Peterson LombardKlenner, Tija Biss C, RN Health Behavior/Discharge Planning: Ability to manage health-related needs will improve 02/05/2017 0559 - Progressing by Peterson LombardKlenner, Calvin Chura C, RN Clinical Measurements: Ability to maintain clinical measurements within normal limits will improve 02/05/2017 0559 - Progressing by Peterson LombardKlenner, Wymon Swaney C, RN Diagnostic test results will improve 02/05/2017 0559 - Progressing by Peterson LombardKlenner, Machell Wirthlin C, RN Cardiovascular complication will be avoided 02/05/2017 0559 - Progressing by Peterson LombardKlenner, Scarlettrose Costilow C, RN Activity: Risk for activity intolerance will decrease 02/05/2017 0559 - Progressing by Peterson LombardKlenner, Li Fragoso C, RN Nutrition: Adequate nutrition will be maintained 02/05/2017 0559 - Progressing by Peterson LombardKlenner, Makira Holleman C, RN Coping: Level of anxiety will decrease 02/05/2017 0559 - Progressing by Peterson LombardKlenner, Cynthea Zachman C, RN Safety: Ability to remain free from injury will improve 02/05/2017 0559 - Progressing by Peterson LombardKlenner, Garfield Coiner C, RN Skin Integrity: Risk for impaired skin integrity will decrease 02/05/2017 0559 - Progressing by Peterson LombardKlenner, Yaser Harvill C, RN

## 2017-02-05 NOTE — Discharge Summary (Signed)
[facility] - [place] at [facility], [place] y.o., DOB 1924/07/16, MRN [id number]. Admission date: 01/29/2017 Discharge Date 02/05/2017 Primary MD Dorothey Baseman, MD Admitting Physician Oralia Manis, MD  Admission Diagnosis  Hypoxia [R09.02] Urinary tract infection without hematuria, site unspecified [N39.0] Community acquired pneumonia of left lower lobe of lung (HCC) [J18.1]  Discharge Diagnosis   Principal Problem:   CAP (community acquired pneumonia)   Acute hypoxic respiratory failure   uti   Essential hypertension   Stage 3 chronic kidney disease (HCC)   Previous history of CVA with right-sided weakness  Essential hypertension   Depression            Hospital Course Christina Bradshaw  is a 83 y.o. female who presents with significant increase in shortness of breath at home for the past couple days with increasing cough.  She was brought in by family for evaluation and found to have pneumonia as well as UTI here.    Patient was admitted and started on antibiotics.  Patient was slow to improve but has been doing much better she is off oxygen.  Patient was seen by physical therapy and will be discharged home with home PT. She is also recommended to have a dysphasia diet.             Consults  None  Significant Tests:  See full reports for all details     Dg Chest 2 View  Result Date: 02/02/2017 CLINICAL DATA:  Short of breath EXAM: CHEST  2 VIEW COMPARISON:  01/29/2017 FINDINGS: Left lower lobe infiltrate and left effusion unchanged. Right lung remains clear. Negative for heart failure. Atherosclerotic aorta. Apical scarring bilaterally. IMPRESSION: No interval change left lower lobe airspace disease and left effusion. Possible pneumonia Electronically Signed   By: Marlan Palau M.D.   On: 02/02/2017 20:43   Dg Chest Port 1 View  Result Date: 01/29/2017 CLINICAL DATA:  Dyspnea today. EXAM: PORTABLE CHEST 1 VIEW COMPARISON:   03/10/2016 FINDINGS: Left base consolidation, new. Possible small left effusion. Right lung is clear. Pulmonary vasculature is normal. Unchanged hilar, mediastinal and cardiac contours. IMPRESSION: New left base consolidation and possible small effusion. Suspicious for pneumonia. Followup PA and lateral chest X-ray is recommended in 3-4 weeks following trial of antibiotic therapy to ensure resolution and exclude underlying malignancy. Electronically Signed   By: Ellery Plunk M.D.   On: 01/29/2017 21:07       Today   Subjective:   Christina Bradshaw patient doing much better breathing much improved  Objective:   Blood pressure (!) 144/67, pulse 70, temperature 98.8 F (37.1 C), temperature source Oral, resp. rate 16, height 5\' 7"  (1.702 m), weight 140 lb (63.5 kg), SpO2 93 %.  .  Intake/Output Summary (Last 24 hours) at 02/05/2017 1316 Last data filed at 02/05/2017 1015 Gross per 24 hour  Intake 360 ml  Output -  Net 360 ml    Exam VITAL SIGNS: Blood pressure (!) 144/67, pulse 70, temperature 98.8 F (37.1 C), temperature source Oral, resp. rate 16, height 5\' 7"  (1.702 m), weight 140 lb (63.5 kg), SpO2 93 %.  GENERAL:  83 y.o.-year-old patient lying in the bed with no acute distress.  EYES: Pupils equal, round, reactive to light and accommodation. No scleral icterus. Extraocular muscles intact.  HEENT: Head atraumatic, normocephalic. Oropharynx and nasopharynx clear.  NECK:  Supple, no jugular venous distention. No thyroid enlargement, no tenderness.  LUNGS: Normal breath sounds bilaterally, no wheezing, rales,rhonchi or  crepitation. No use of accessory muscles of respiration.  CARDIOVASCULAR: S1, S2 normal. No murmurs, rubs, or gallops.  ABDOMEN: Soft, nontender, nondistended. Bowel sounds present. No organomegaly or mass.  EXTREMITIES: No pedal edema, cyanosis, or clubbing.  NEUROLOGIC: Cranial nerves II through XII are intact. Muscle strength 5/5 in all extremities. Sensation  intact. Gait not checked.  PSYCHIATRIC: The patient is alert and oriented x 3.  SKIN: No obvious rash, lesion, or ulcer.   Data Review     CBC w Diff:  Lab Results  Component Value Date   WBC 12.6 (H) 02/02/2017   HGB 11.9 (L) 02/02/2017   HGB 15.0 07/11/2013   HCT 35.1 02/02/2017   HCT 43.9 07/11/2013   PLT 268 02/02/2017   PLT 218 07/11/2013   LYMPHOPCT 8 01/29/2017   LYMPHOPCT 27.8 07/11/2013   MONOPCT 8 01/29/2017   MONOPCT 8.9 07/11/2013   EOSPCT 0 01/29/2017   EOSPCT 2.3 07/11/2013   BASOPCT 1 01/29/2017   BASOPCT 0.9 07/11/2013   CMP:  Lab Results  Component Value Date   NA 135 02/01/2017   NA 138 07/11/2013   K 3.8 02/01/2017   K 4.0 07/11/2013   CL 102 02/01/2017   CL 104 07/11/2013   CO2 24 02/01/2017   CO2 27 07/11/2013   BUN 30 (H) 02/01/2017   BUN 11 07/11/2013   CREATININE 0.86 02/05/2017   CREATININE 0.96 07/11/2013   PROT 7.1 01/29/2017   ALBUMIN 3.3 (L) 01/29/2017   BILITOT 0.7 01/29/2017   ALKPHOS 79 01/29/2017   AST 23 01/29/2017   ALT 17 01/29/2017  .  Micro Results Recent Results (from the past 240 hour(s))  Blood Culture (routine x 2)     Status: Abnormal   Collection Time: 01/29/17  8:38 PM  Result Value Ref Range Status   Specimen Description BLOOD RAC  Final   Special Requests   Final    BOTTLES DRAWN AEROBIC AND ANAEROBIC Blood Culture adequate volume   Culture  Setup Time   Final    GRAM NEGATIVE RODS ANAEROBIC BOTTLE ONLY CRITICAL RESULT CALLED TO, READ BACK BY AND VERIFIED WITH: HANNAH LISSEY 01/30/17 @ 1748  MLK    Culture SPHINGOMONAS PAUCIMOBILIS (A)  Final   Report Status 02/02/2017 FINAL  Final   Organism ID, Bacteria SPHINGOMONAS PAUCIMOBILIS  Final      Susceptibility   Sphingomonas paucimobilis - MIC*    CEFEPIME <=1 SENSITIVE Sensitive     CEFAZOLIN <=4 SENSITIVE Sensitive     GENTAMICIN <=1 SENSITIVE Sensitive     CIPROFLOXACIN <=0.25 SENSITIVE Sensitive     IMIPENEM <=0.25 SENSITIVE Sensitive      TRIMETH/SULFA <=20 SENSITIVE Sensitive     * SPHINGOMONAS PAUCIMOBILIS  Blood Culture ID Panel (Reflexed)     Status: None   Collection Time: 01/29/17  8:38 PM  Result Value Ref Range Status   Enterococcus species NOT DETECTED NOT DETECTED Final   Listeria monocytogenes NOT DETECTED NOT DETECTED Final   Staphylococcus species NOT DETECTED NOT DETECTED Final   Staphylococcus aureus NOT DETECTED NOT DETECTED Final   Streptococcus species NOT DETECTED NOT DETECTED Final   Streptococcus agalactiae NOT DETECTED NOT DETECTED Final   Streptococcus pneumoniae NOT DETECTED NOT DETECTED Final   Streptococcus pyogenes NOT DETECTED NOT DETECTED Final   Acinetobacter baumannii NOT DETECTED NOT DETECTED Final   Enterobacteriaceae species NOT DETECTED NOT DETECTED Final   Enterobacter cloacae complex NOT DETECTED NOT DETECTED Final   Escherichia coli NOT DETECTED NOT  DETECTED Final   Klebsiella oxytoca NOT DETECTED NOT DETECTED Final   Klebsiella pneumoniae NOT DETECTED NOT DETECTED Final   Proteus species NOT DETECTED NOT DETECTED Final   Serratia marcescens NOT DETECTED NOT DETECTED Final   Haemophilus influenzae NOT DETECTED NOT DETECTED Final   Neisseria meningitidis NOT DETECTED NOT DETECTED Final   Pseudomonas aeruginosa NOT DETECTED NOT DETECTED Final   Candida albicans NOT DETECTED NOT DETECTED Final   Candida glabrata NOT DETECTED NOT DETECTED Final   Candida krusei NOT DETECTED NOT DETECTED Final   Candida parapsilosis NOT DETECTED NOT DETECTED Final   Candida tropicalis NOT DETECTED NOT DETECTED Final  Blood Culture (routine x 2)     Status: None   Collection Time: 01/29/17  8:39 PM  Result Value Ref Range Status   Specimen Description BLOOD RIGHT ARM  Final   Special Requests   Final    BOTTLES DRAWN AEROBIC AND ANAEROBIC Blood Culture adequate volume   Culture NO GROWTH 5 DAYS  Final   Report Status 02/03/2017 FINAL  Final  Urine Culture     Status: Abnormal   Collection Time:  01/29/17  9:20 PM  Result Value Ref Range Status   Specimen Description URINE, RANDOM  Final   Special Requests NONE  Final   Culture >=100,000 COLONIES/mL ESCHERICHIA COLI (A)  Final   Report Status 02/01/2017 FINAL  Final   Organism ID, Bacteria ESCHERICHIA COLI (A)  Final      Susceptibility   Escherichia coli - MIC*    AMPICILLIN <=2 SENSITIVE Sensitive     CEFAZOLIN <=4 SENSITIVE Sensitive     CEFTRIAXONE <=1 SENSITIVE Sensitive     CIPROFLOXACIN <=0.25 SENSITIVE Sensitive     GENTAMICIN <=1 SENSITIVE Sensitive     IMIPENEM <=0.25 SENSITIVE Sensitive     NITROFURANTOIN <=16 SENSITIVE Sensitive     TRIMETH/SULFA <=20 SENSITIVE Sensitive     AMPICILLIN/SULBACTAM <=2 SENSITIVE Sensitive     PIP/TAZO <=4 SENSITIVE Sensitive     Extended ESBL NEGATIVE Sensitive     * >=100,000 COLONIES/mL ESCHERICHIA COLI  CULTURE, BLOOD (ROUTINE X 2) w Reflex to ID Panel     Status: None   Collection Time: 01/31/17 10:23 AM  Result Value Ref Range Status   Specimen [facility]  Final   Special Requests   Final    BOTTLES DRAWN AEROBIC AND ANAEROBIC Blood Culture adequate volume   Culture NO GROWTH 5 DAYS  Final   Report Status 02/05/2017 FINAL  Final  CULTURE, BLOOD (ROUTINE X 2) w Reflex to ID Panel     Status: None   Collection Time: 01/31/17 10:34 AM  Result Value Ref Range Status   Specimen Description BLOOD LT WRIST  Final   Special Requests   Final    BOTTLES DRAWN AEROBIC AND ANAEROBIC Blood Culture adequate volume   Culture NO GROWTH 5 DAYS  Final   Report Status 02/05/2017 FINAL  Final        Code Status Orders  (From admission, onward)        Start     Ordered   01/30/17 1131  Do not attempt resuscitation (DNR)  Continuous    Question Answer Comment  In the event of cardiac or respiratory ARREST Do not call a "code blue"   In the event of cardiac or respiratory ARREST Do not perform Intubation, CPR, defibrillation or ACLS   In the event of cardiac or  respiratory ARREST Use medication by any route,  position, wound care, and other measures to relive pain and suffering. May use oxygen, suction and manual treatment of airway obstruction as needed for comfort.   Comments nurse may pronounce      01/30/17 1130    Code Status History    Date Active Date Inactive Code Status Order ID Comments User Context   01/30/2017 01:49 01/30/2017 11:30 Partial Code [id number]  Oralia Manis, MD Inpatient   02/29/2016 15:55 03/26/2016 17:35 Partial Code [id number]  Jerene Pitch Inpatient   02/29/2016 15:37 02/29/2016 15:55 DNR [id number]  Jacquelynn Cree, PA-C Inpatient   02/29/2016 15:37 02/29/2016 15:37 Full Code [id number]  Jacquelynn Cree, PA-C Inpatient   02/27/2016 15:33 02/29/2016 15:34 DNR [id number]  Delfino Lovett, MD Inpatient   02/27/2016 03:23 02/27/2016 15:33 Full Code [id number]  Tonye Royalty, DO ED   02/27/2016 01:33 02/27/2016 03:23 DNR [id number]  Tonye Royalty, DO ED    Advance Directive Documentation     Most Recent Value  Type of Advance Directive  Healthcare Power of Attorney, Living will  Pre-existing out of facility DNR order (yellow form or pink MOST form)  No data  "MOST" Form in Place?  No data          Follow-up Information    Dorothey Baseman, MD. Schedule an appointment as soon as possible for a visit in 1 week.   Specialty:  Family Medicine Why:  no answer at office  Contact information: [address] [place] [address] [phone]           Discharge Medications   Allergies as of 02/05/2017   No Known Allergies     Medication List    TAKE these medications   acetaminophen 325 MG tablet Commonly known as:  TYLENOL Take 1-2 tablets (325-650 mg total) by mouth every 4 (four) hours as needed for mild pain.   alum & mag hydroxide-simeth 200-200-20 MG/5ML suspension Commonly known as:  MAALOX/MYLANTA Take 30 mLs by mouth every 4 (four) hours as needed for indigestion.   amLODipine 10 MG  tablet Commonly known as:  NORVASC Take 1 tablet (10 mg total) by mouth daily.   aspirin 81 MG chewable tablet Chew 1 tablet (81 mg total) by mouth daily.   atorvastatin 40 MG tablet Commonly known as:  LIPITOR Take 1 tablet (40 mg total) by mouth daily at 6 PM. What changed:  how much to take   baclofen 10 MG tablet Commonly known as:  LIORESAL Take 10 mg by mouth at bedtime.   baclofen 10 MG tablet Commonly known as:  LIORESAL Take 0.5 tablets (5 mg total) by mouth 3 (three) times daily.   bisacodyl 10 MG suppository Commonly known as:  DULCOLAX Place 1 suppository (10 mg total) rectally every other day.   cefUROXime 500 MG tablet Commonly known as:  CEFTIN Take 1 tablet (500 mg total) by mouth 2 (two) times daily for 4 days.   docusate 50 MG/5ML liquid Commonly known as:  COLACE Take 10 mLs (100 mg total) by mouth 2 (two) times daily.   feeding supplement (ENSURE ENLIVE) Liqd Take 237 mLs by mouth 3 (three) times daily between meals.   FLUoxetine 20 MG capsule Commonly known as:  PROZAC Take 1 capsule (20 mg total) by mouth daily. What changed:  how much to take   hydrALAZINE 25 MG tablet Commonly known as:  APRESOLINE Take 1.5 tablets (37.5 mg total) by mouth every 8 (eight) hours.   levothyroxine 75 MCG tablet  Commonly known as:  SYNTHROID, LEVOTHROID Take 75 mcg by mouth daily before breakfast.   lisinopril 20 MG tablet Commonly known as:  PRINIVIL,ZESTRIL Take 1 tablet (20 mg total) by mouth daily.   metoprolol tartrate 25 MG tablet Commonly known as:  LOPRESSOR Take 1 tablet (25 mg total) by mouth 2 (two) times daily.   multivitamin with minerals Tabs tablet Take 1 tablet by mouth daily.   MUSCLE RUB 10-15 % Crea Apply 1 application topically 4 (four) times daily -  with meals and at bedtime. Right calf/right thigh   omeprazole 20 MG capsule Commonly known as:  PRILOSEC Take 20 mg by mouth daily.   pantoprazole sodium 40 mg/20 mL  Pack Commonly known as:  PROTONIX Place 20 mLs (40 mg total) into feeding tube daily.   simethicone 40 MG/0.6ML drops Commonly known as:  MYLICON Take 0.6 mLs (40 mg total) by mouth every 6 (six) hours as needed for flatulence.   traZODone 50 MG tablet Commonly known as:  DESYREL Take 1 tablet (50 mg total) by mouth at bedtime.   Vitamin D (Cholecalciferol) 400 units Caps Take 400 Units by mouth daily.          Total Time in preparing paper work, data evaluation and todays exam - 35 minutes  Auburn BilberryPATEL, Naika Noto M.D on 02/05/2017 at 1:16 PM  [facility]   Office  [phone]

## 2017-02-05 NOTE — Care Management (Signed)
Notified [facility] that patient discharging today

## 2017-02-05 NOTE — Progress Notes (Signed)
Physical Therapy Treatment Patient Details Name: Christina Bradshaw MRN: [id number] DOB: Jun 26, 1924 Today's Date: 02/05/2017    History of Present Illness Christina Bradshaw  is a 83 y.o. female who presents with significant increase in shortness of breath at home for the past couple days with increasing cough.  She was brought in by family for evaluation and found to have pneumonia as well as UTI here.  She did not meet sepsis criteria.  Hospitalist were called for admission.    PT Comments    Pt is making gradual progress towards goals with ability to transfer to recliner this date. Pt appears motivated to participate. Ability to perform B LE there-ex this date. Safe to dc home.   Follow Up Recommendations  No PT follow up;Other (comment)     Equipment Recommendations  None recommended by PT    Recommendations for Other Services       Precautions / Restrictions Precautions Precautions: Fall Restrictions Weight Bearing Restrictions: No    Mobility  Bed Mobility Overal bed mobility: Needs Assistance Bed Mobility: Supine to Sit     Supine to sit: Min assist     General bed mobility comments: safe technique with cues for initiation of movement. Once seated, upright posture noted. Follows commands well  Transfers Overall transfer level: Needs assistance Equipment used: None Transfers: Sit to/from UGI CorporationStand;Stand Pivot Transfers Sit to Stand: Mod assist Stand pivot transfers: Mod assist       General transfer comment: mod assist for standing without AD. Once standing, able to take a step with L foot and reach for arm rest with L arm. Transferred to recliner safely with mod assist.  Ambulation/Gait             General Gait Details: Unsafe/unable to perform   Stairs            Wheelchair Mobility    Modified Rankin (Stroke Patients Only)       Balance                                            Cognition Arousal/Alertness:  Awake/alert Behavior During Therapy: WFL for tasks assessed/performed Overall Cognitive Status: Within Functional Limits for tasks assessed                                        Exercises Other Exercises Other Exercises: Supine/seated ther-ex performed on B LE including SLRs, heel slides, hip abd/add, hip add squeezes, and LAQ. All ther-ex performed x 15 reps with cga. Cues for correct technique    General Comments        Pertinent Vitals/Pain Pain Assessment: No/denies pain    Home Living                      Prior Function            PT Goals (current goals can now be found in the care plan section) Acute Rehab PT Goals Patient Stated Goal: return home safely PT Goal Formulation: With patient/family Time For Goal Achievement: 02/14/17 Potential to Achieve Goals: Good Progress towards PT goals: Progressing toward goals    Frequency    Min 2X/week      PT Plan Current plan remains appropriate    Co-evaluation  AM-PAC PT "6 Clicks" Daily Activity  Outcome Measure  Difficulty turning over in bed (including adjusting bedclothes, sheets and blankets)?: Unable Difficulty moving from lying on back to sitting on the side of the bed? : Unable Difficulty sitting down on and standing up from a chair with arms (e.g., wheelchair, bedside commode, etc,.)?: Unable Help needed moving to and from a bed to chair (including a wheelchair)?: A Lot Help needed walking in hospital room?: Total Help needed climbing 3-5 steps with a railing? : Total 6 Click Score: 7    End of Session Equipment Utilized During Treatment: Gait belt Activity Tolerance: Patient limited by fatigue Patient left: in chair;with chair alarm set Nurse Communication: Mobility status PT Visit Diagnosis: Unsteadiness on feet (R26.81);Muscle weakness (generalized) (M62.81);Difficulty in walking, not elsewhere classified (R26.2)     Time: [id number] PT Time  Calculation (min) (ACUTE ONLY): 23 min  Charges:  $Therapeutic Exercise: 8-22 mins $Therapeutic Activity: 8-22 mins                    G Codes:  Functional Assessment Tool Used: AM-PAC 6 Clicks Basic Mobility Functional Limitation: Mobility: Walking and moving around Mobility: Walking and Moving Around Current Status ([id number](G8978): At least 80 percent but less than 100 percent impaired, limited or restricted Mobility: Walking and Moving Around Goal Status [phone](G8979): At least 60 percent but less than 80 percent impaired, limited or restricted    Elizabeth PalauStephanie Graden Hoshino, PT, DPT [phone]    [name] 02/05/2017, 2:49 PM

## 2017-02-05 NOTE — Care Management Note (Signed)
Case Management Note  Patient Details  Name: Christina Bradshaw MRN: [id number] Date of Birth: [birth year]  Subjective/Objective:  Spoke with daughter and son Christina Bradshaw([name] and [name]). Discussed home health and they prefer [facility]. Patient has had them in the recent past. Notified [name] of referral. Will need PT, RN and HHA. No DME needs. No home O2. Son and daughter are in agreement with POC.                   Action/Plan: [facility] for RN, PT and HHA.    Expected Discharge Date:                  Expected Discharge Plan:  Home w Home Health Services  In-House Referral:     Discharge planning Services  CM Consult  Post Acute Care Choice:  Home Health Choice offered to:  Adult Children  DME Arranged:    DME Agency:     HH Arranged:  RN, PT, Nurse's Aide HH Agency:  [facility]  Status of Service:  In process, will continue to follow  If discussed at Long Length of Stay Meetings, dates discussed:    Additional Comments:  [name], RN 02/05/2017, 9:22 AM

## 2017-02-05 NOTE — Progress Notes (Signed)
Patient discharged home per MD order. All discharge instructions given to son and patient and all questions answered.

## 2017-02-05 NOTE — Discharge Instructions (Signed)
[facility] [place] [facility]  DIET:  :  Dysphagia level 2(MINCED foods, moistened); Thin liquids; strict aspiration precautions; Pills in Puree. Assist at meals as needed.  DISCHARGE CONDITION:  Stable  ACTIVITY:  Activity as tolerated  OXYGEN:  Home Oxygen: No.   Oxygen Delivery: room air  DISCHARGE LOCATION:  home    ADDITIONAL DISCHARGE INSTRUCTION:   If you experience worsening of your admission symptoms, develop shortness of breath, life threatening emergency, suicidal or homicidal thoughts you must seek medical attention immediately by calling 911 or calling your MD immediately  if symptoms less severe.  You Must read complete instructions/literature along with all the possible adverse reactions/side effects for all the Medicines you take and that have been prescribed to you. Take any new Medicines after you have completely understood and accpet all the possible adverse reactions/side effects.   Please note  You were cared for by a hospitalist during your hospital stay. If you have any questions about your discharge medications or the care you received while you were in the hospital after you are discharged, you can call the unit and asked to speak with the hospitalist on call if the hospitalist that took care of you is not available. Once you are discharged, your primary care physician will handle any further medical issues. Please note that NO REFILLS for any discharge medications will be authorized once you are discharged, as it is imperative that you return to your primary care physician (or establish a relationship with a primary care physician if you do not have one) for your aftercare needs so that they can reassess your need for medications and monitor your lab values.

## 2018-06-18 DEATH — deceased

## 2018-08-20 IMAGING — MR MR MRA HEAD W/O CM
11 of 12 series · 36 of 48 positions shown · non-contrast
Comparison: CT 02/26/2016

CLINICAL DATA: Stroke

EXAM:
MRI HEAD WITHOUT CONTRAST
MRA HEAD WITHOUT CONTRAST
TECHNIQUE: Multiplanar, multiecho pulse sequences of the brain and surrounding
structures were obtained without intravenous contrast. Angiographic
images of the head were obtained using MRA technique without
contrast.

[Series 2: T1 · sagittal · 5.0mm · 0.45mm/px · 2 of 28 slices shown (1 of 2)]
[im 1/28]
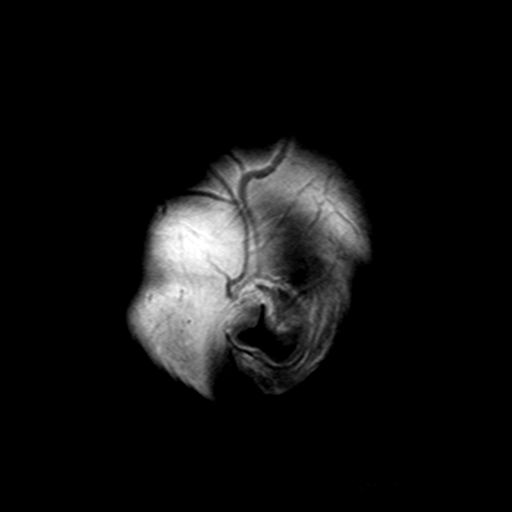
[im 28/28]
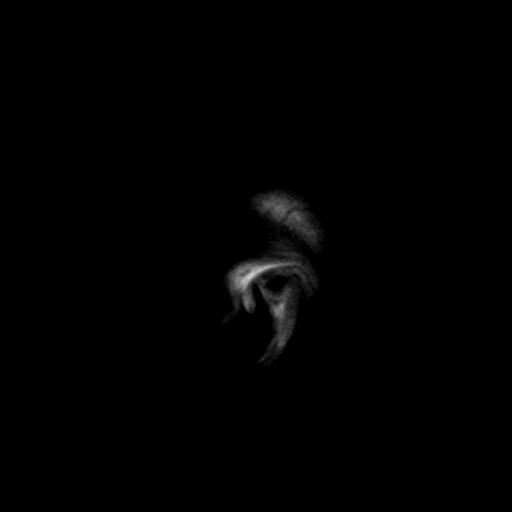

[Series 4: DWI · axial · 4.0mm · 0.94mm/px · z∈[-60,+107]mm · 3 of 44 slices shown (1 of 4)]
[im 1/44]
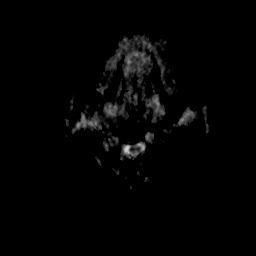
[im 22/44]
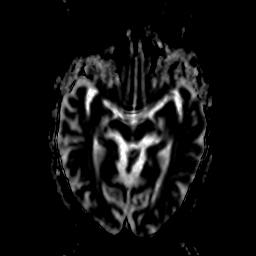
[im 44/44]
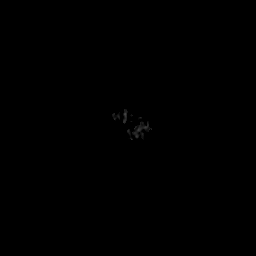

[Series 6: DWI · coronal · 5.0mm · 1.80mm/px · 4 of 38 slices shown (2 of 4)]
[im 1/38]
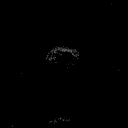
[im 13/38]
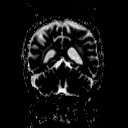
[im 25/38]
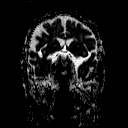
[im 38/38]
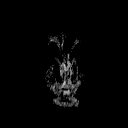

[Series 7: DWI · axial · 4.0mm · 0.94mm/px · z∈[-56,+107]mm · 4 of 43 slices shown (3 of 4)]
[im 1/43]
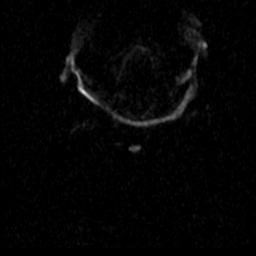
[im 15/43]
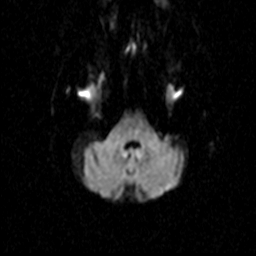
[im 29/43]
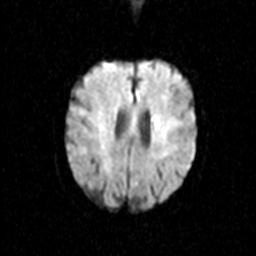
[im 43/43]
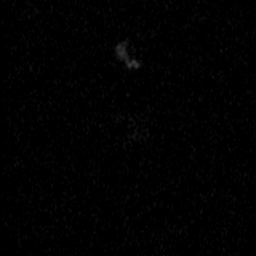

[Series 8: DWI · coronal · 5.0mm · 1.80mm/px · 4 of 39 slices shown (4 of 4)]
[im 1/39]
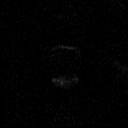
[im 13/39]
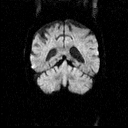
[im 26/39]
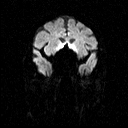
[im 39/39]
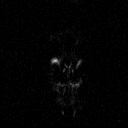

[Series 9: TOF · axial · 0.8mm · 0.39mm/px · z∈[-64,-47]mm · 2 of 131 slices shown]
[im 1/131]
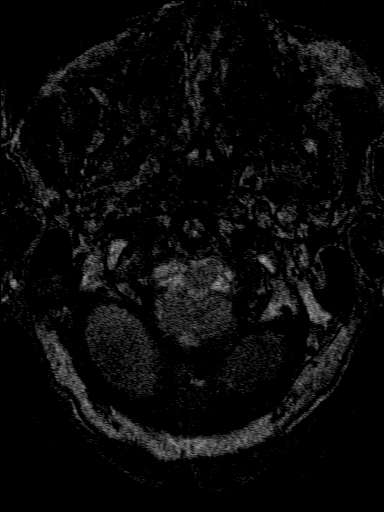
[im 22/131]
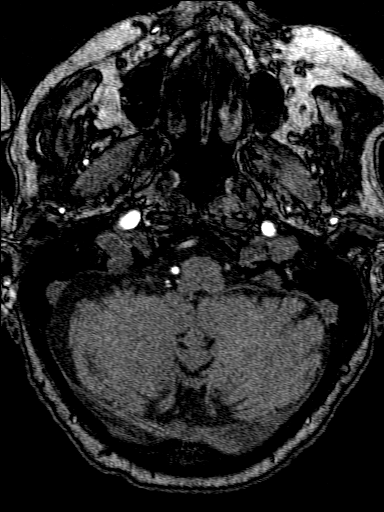

[Series 13: T2 · axial · 5.0mm · 0.45mm/px · z∈[-56,+106]mm · 3 of 27 slices shown (1 of 3)]
[im 1/27]
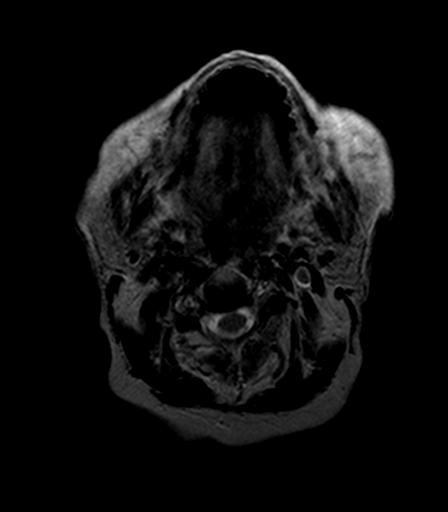
[im 14/27]
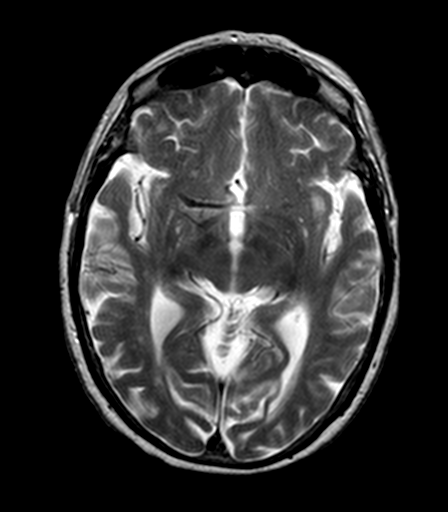
[im 27/27]
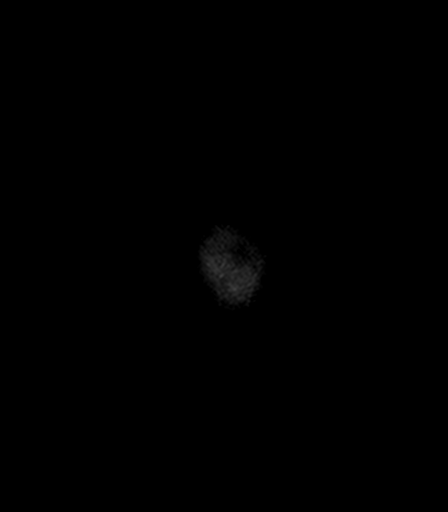

[Series 14: FLAIR · axial · 5.0mm · 0.90mm/px · z∈[-56,+106]mm · 3 of 27 slices shown]
[im 1/27]
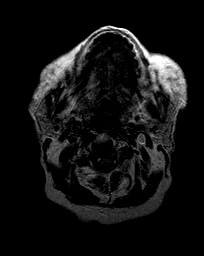
[im 14/27]
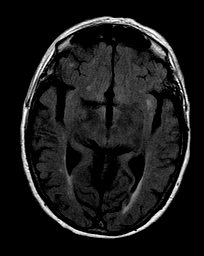
[im 27/27]
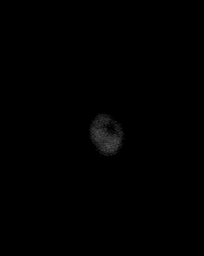

[Series 17: T2 · axial · 5.0mm · 0.45mm/px · z∈[-56,+106]mm · 3 of 27 slices shown (2 of 3)]
[im 1/27]
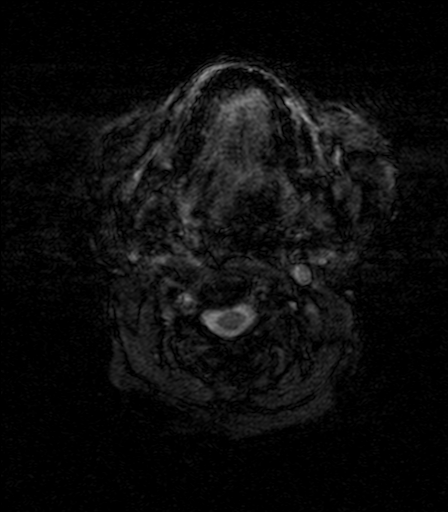
[im 14/27]
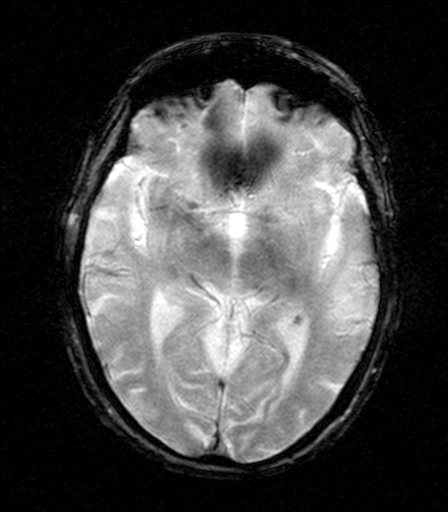
[im 27/27]
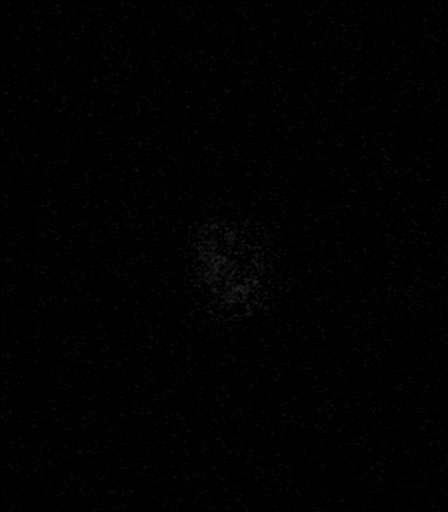

[Series 19: T1 · axial · 3.0mm · 0.45mm/px · z∈[-55,+103]mm · 5 of 56 slices shown (2 of 2)]
[im 1/56]
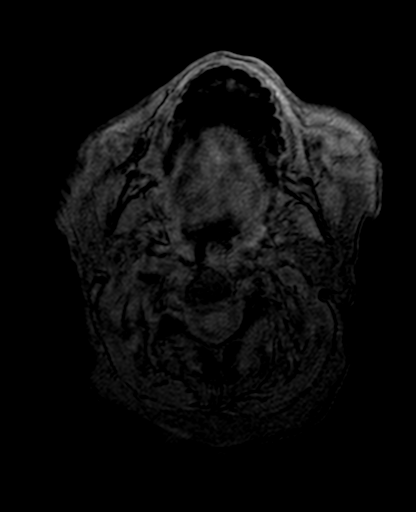
[im 14/56]
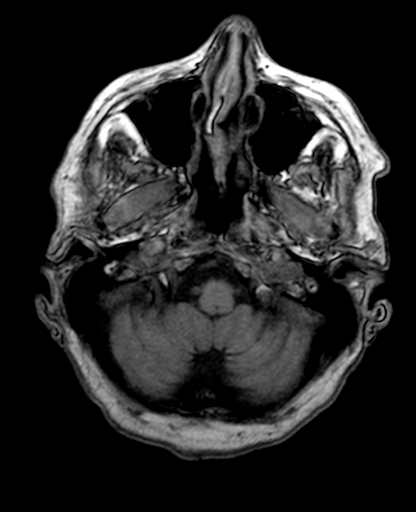
[im 28/56]
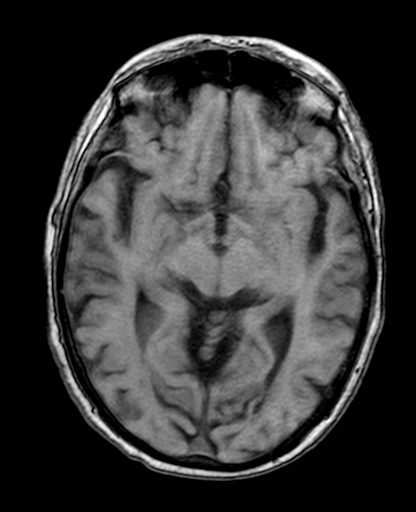
[im 42/56]
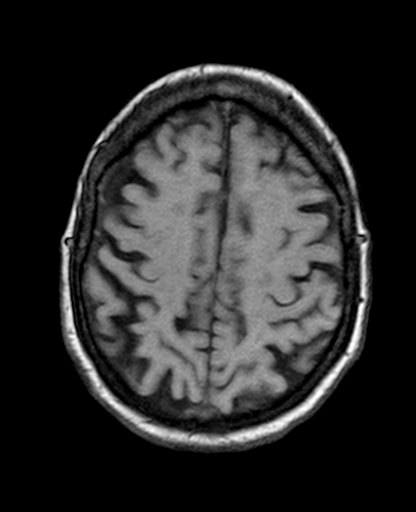
[im 56/56]
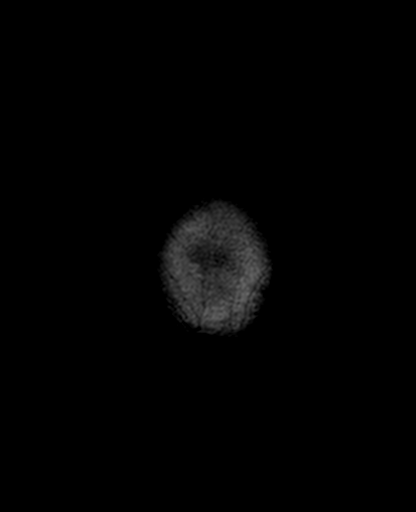

[Series 22: T2 · coronal · 5.0mm · 0.45mm/px · 3 of 30 slices shown (3 of 3)]
[im 1/30]
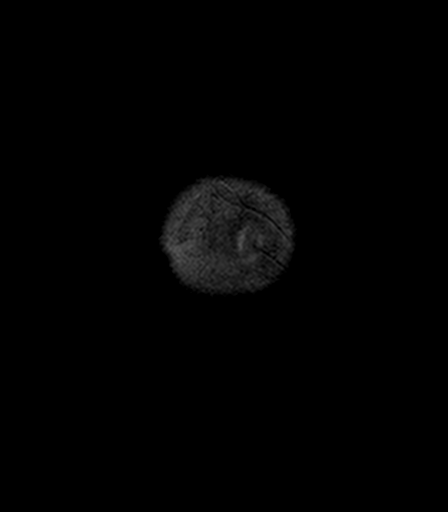
[im 15/30]
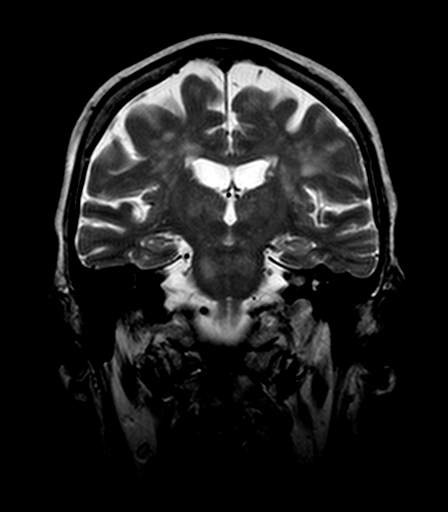
[im 30/30]
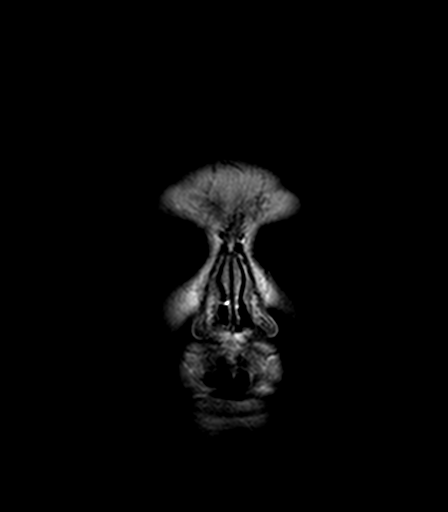

[36 of 48 positions shown; findings below may reference images not displayed]

FINDINGS: MRI HEAD FINDINGS

Brain: Acute infarct left corona radiata extending into the putamen.
Small area of acute infarct body of the caudate on the left. No
other areas of acute infarct.

Mild generalized atrophy. Moderate chronic microvascular ischemic
changes in the white matter and pons. Negative for hemorrhage or
mass. No hydrocephalus or shift of the midline structures.

Skull and upper cervical spine: Negative

Sinuses/Orbits: Mild mucosal edema in the paranasal sinuses.
Bilateral lens replacement.

Other: None

MRA HEAD FINDINGS

Distal right vertebral artery patent to the basilar. Left vertebral
artery non dominant and patent to the basilar. Basilar widely
patent. Right PICA patent. Left PICA not visualized. AICA, superior
cerebellar arteries patent bilaterally. Fetal origin of the
posterior cerebral artery bilaterally with hypoplastic distal
basilar.

Internal carotid artery patent bilaterally without significant
stenosis. Hypoplastic left A1 segment. Both anterior cerebral
arteries supplied from the right. Middle cerebral arteries patent
bilaterally.

Negative for cerebral aneurysm.
IMPRESSION: Acute infarct in the deep white matter on the left extending into
the caudate and putamen.

Atrophy with moderate chronic microvascular ischemia

No significant intracranial stenosis.  No large vessel occlusion.
